# Patient Record
Sex: Male | Born: 1945 | Race: White | Hispanic: No | Marital: Married | Smoking: Former smoker
Health system: Southern US, Community
[De-identification: ages and names within clinical notes are randomized; demographics above are authoritative.]

## PROBLEM LIST (undated history)

## (undated) DIAGNOSIS — K760 Fatty (change of) liver, not elsewhere classified: Secondary | ICD-10-CM

## (undated) DIAGNOSIS — D696 Thrombocytopenia, unspecified: Secondary | ICD-10-CM

## (undated) DIAGNOSIS — K219 Gastro-esophageal reflux disease without esophagitis: Secondary | ICD-10-CM

## (undated) DIAGNOSIS — E041 Nontoxic single thyroid nodule: Secondary | ICD-10-CM

## (undated) DIAGNOSIS — E7800 Pure hypercholesterolemia, unspecified: Secondary | ICD-10-CM

## (undated) DIAGNOSIS — G4733 Obstructive sleep apnea (adult) (pediatric): Secondary | ICD-10-CM

## (undated) DIAGNOSIS — I10 Essential (primary) hypertension: Secondary | ICD-10-CM

## (undated) DIAGNOSIS — N400 Enlarged prostate without lower urinary tract symptoms: Secondary | ICD-10-CM

## (undated) DIAGNOSIS — R739 Hyperglycemia, unspecified: Secondary | ICD-10-CM

## (undated) DIAGNOSIS — D649 Anemia, unspecified: Secondary | ICD-10-CM

## (undated) HISTORY — DX: Hyperglycemia, unspecified: R73.9

## (undated) HISTORY — PX: INGUINAL HERNIA REPAIR: SUR1180

## (undated) HISTORY — DX: Thrombocytopenia, unspecified: D69.6

## (undated) HISTORY — DX: Pure hypercholesterolemia, unspecified: E78.00

## (undated) HISTORY — DX: Anemia, unspecified: D64.9

## (undated) HISTORY — DX: Gastro-esophageal reflux disease without esophagitis: K21.9

## (undated) HISTORY — DX: Essential (primary) hypertension: I10

## (undated) HISTORY — DX: Obstructive sleep apnea (adult) (pediatric): G47.33

## (undated) HISTORY — DX: Nontoxic single thyroid nodule: E04.1

## (undated) HISTORY — PX: TONSILLECTOMY: SUR1361

---

## 2005-03-12 ENCOUNTER — Ambulatory Visit: Payer: Self-pay | Admitting: Unknown Physician Specialty

## 2007-09-10 ENCOUNTER — Other Ambulatory Visit: Payer: Self-pay

## 2007-09-10 ENCOUNTER — Emergency Department: Payer: Self-pay | Admitting: Emergency Medicine

## 2010-06-24 ENCOUNTER — Emergency Department: Payer: Self-pay | Admitting: Emergency Medicine

## 2010-12-09 ENCOUNTER — Ambulatory Visit: Payer: Self-pay | Admitting: Internal Medicine

## 2010-12-23 ENCOUNTER — Ambulatory Visit: Payer: Self-pay | Admitting: Internal Medicine

## 2011-06-07 ENCOUNTER — Ambulatory Visit: Payer: Self-pay | Admitting: Internal Medicine

## 2012-01-28 ENCOUNTER — Other Ambulatory Visit: Payer: Self-pay | Admitting: *Deleted

## 2012-01-28 MED ORDER — ATORVASTATIN CALCIUM 40 MG PO TABS: 40.0000 mg | ORAL_TABLET | Freq: Every day | ORAL | Status: DC

## 2012-01-28 NOTE — Telephone Encounter (Signed)
R'cd fax from [facility] for refill of generic Lipitor 40mg  tablets-rx faxed to [facility] pharmacy.

## 2012-03-14 ENCOUNTER — Ambulatory Visit (INDEPENDENT_AMBULATORY_CARE_PROVIDER_SITE_OTHER): Payer: Self-pay | Admitting: Internal Medicine

## 2012-03-14 ENCOUNTER — Encounter: Payer: Self-pay | Admitting: Internal Medicine

## 2012-03-14 ENCOUNTER — Telehealth: Payer: Self-pay | Admitting: Internal Medicine

## 2012-03-14 VITALS — BP 150/84 | HR 57 | Temp 98.1°F | Ht 71.0 in | Wt 194.0 lb

## 2012-03-14 DIAGNOSIS — Z0189 Encounter for other specified special examinations: Secondary | ICD-10-CM

## 2012-03-14 DIAGNOSIS — Z23 Encounter for immunization: Secondary | ICD-10-CM

## 2012-03-14 DIAGNOSIS — Z008 Encounter for other general examination: Secondary | ICD-10-CM

## 2012-03-14 DIAGNOSIS — E041 Nontoxic single thyroid nodule: Secondary | ICD-10-CM

## 2012-03-14 DIAGNOSIS — R739 Hyperglycemia, unspecified: Secondary | ICD-10-CM

## 2012-03-14 DIAGNOSIS — R030 Elevated blood-pressure reading, without diagnosis of hypertension: Secondary | ICD-10-CM

## 2012-03-14 DIAGNOSIS — R7309 Other abnormal glucose: Secondary | ICD-10-CM

## 2012-03-14 DIAGNOSIS — E7800 Pure hypercholesterolemia, unspecified: Secondary | ICD-10-CM

## 2012-03-14 DIAGNOSIS — IMO0001 Reserved for inherently not codable concepts without codable children: Secondary | ICD-10-CM

## 2012-03-14 LAB — LIPID PANEL
Cholesterol: 168 mg/dL (ref 0–200)
HDL: 92.3 mg/dL (ref >39.00)
Triglycerides: 51 mg/dL (ref 0.0–149.0)
VLDL: 10.2 mg/dL (ref 0.0–40.0)

## 2012-03-14 LAB — BASIC METABOLIC PANEL
Calcium: 9.3 mg/dL (ref 8.4–10.5)
GFR: 81.92 mL/min (ref >60.00)
Glucose, Bld: 110 mg/dL — ABNORMAL HIGH (ref 70–99)
Potassium: 4.4 mEq/L (ref 3.5–5.1)
Sodium: 136 mEq/L (ref 135–145)

## 2012-03-14 LAB — HEPATIC FUNCTION PANEL
ALT: 34 U/L (ref 0–53)
AST: 34 U/L (ref 0–37)
Albumin: 4.4 g/dL (ref 3.5–5.2)
Total Bilirubin: 1 mg/dL (ref 0.3–1.2)
Total Protein: 7.4 g/dL (ref 6.0–8.3)

## 2012-03-14 LAB — HEMOGLOBIN A1C: Hgb A1c MFr Bld: 5.8 % (ref 4.6–6.5)

## 2012-03-14 MED ORDER — PNEUMOCOCCAL VAC POLYVALENT 25 MCG/0.5ML IJ INJ
0.5000 mL | INJECTION | Freq: Once | INTRAMUSCULAR | Status: AC
  Administered 2012-03-14: 0.5 mL via INTRAMUSCULAR

## 2012-03-14 NOTE — Progress Notes (Signed)
  Subjective:    Patient ID: Nathan Watts, male    DOB: [birth year], 66 y.o.   MRN: [id number]  HPI 66 year old male with past history of hypercholesterolemia, hyperglycemia and a thyroid nodule.  He comes in today for a scheduled follow up.  States he is doing well.  Exercising.  Now walking 6 miles per day.  No cardiac symptoms with increased activity or exertion.  Feels better.  Did get his flu shot.  Blood pressures on outside checks averaging:  117-129/64-72.   Past Medical History  Diagnosis Date  . Hypercholesterolemia   . Thrombocytopenia   . Hyperglycemia   . Thyroid nodule   . GERD (gastroesophageal reflux disease)   . Anemia, unspecified   . Hypertension     Current Outpatient Prescriptions on File Prior to Visit  Medication Sig Dispense Refill  . Omeprazole Magnesium (PRILOSEC OTC PO) Take by mouth daily.      Marland Kitchen atorvastatin (LIPITOR) 40 MG tablet Take 1 tablet (40 mg total) by mouth daily.  90 tablet  3    Review of Systems Patient denies any headache, lightheadedness or dizziness.  No sinus or allergy symptoms.  No chest pain, tightness or palpitations.  No increased shortness of breath, cough or congestion.  No nausea or vomiting.  No acid reflux.   No abdominal pain or cramping.  No bowel change, such as diarrhea, constipation, BRBPR or melana.  No urine change.        Objective:   Physical Exam Filed Vitals:   03/14/12 0831  BP: 150/84  Pulse: 57  Temp: 98.1 F (36.7 C)   Blood pressure recheck:  21/44  66 year old male in no acute distress.   HEENT:  Nares - clear.  OP- without lesions or erythema.  NECK:  Supple, nontender.  No audible bruit.   HEART:  Appears to be regular. LUNGS:  Without crackles or wheezing audible.  Respirations even and unlabored.   RADIAL PULSE:  Equal bilaterally.  ABDOMEN:  Soft, nontender.  No audible abdominal bruit.   EXTREMITIES:  No increased edema to be present.                     Assessment & Plan:  PULMONARY.   Asymptomatic.  Breathing stable.   THROMBOCYTOPENIA.  Intermittently, platelet count has been low.  Recheck cbc to confirm normal.   GI.  Had colonoscopy.  Recommended follow up colonoscopy 2016.  Hemoccult cards negative 2/41/3.  Currently asymptomatic.  ELEVATED BLOOD PRESSURE.  Outside checks under good control.  Follow.  Remain off medication.      HEALTH MAINTENANCE.  Last physical 05/07/11.  PSA checked 05/03/11 - 2.86.  Colonoscopy - above. Pneumovax given today.  Already had the flu shot.

## 2012-03-14 NOTE — Patient Instructions (Addendum)
It was nice seeing you today.  I am glad you have been doing well.  Let me know if you need anything. 

## 2012-03-14 NOTE — Assessment & Plan Note (Signed)
Biopsy negative.  Saw Dr Renae Fickle 06/26/11 - ultrasound revealed a stable nodule.  Recommended a follow up TSH and ultrasound in one year.  If stable at one year mark, recommended a follow up ultrasound in 3-5 years.

## 2012-03-18 NOTE — Telephone Encounter (Signed)
Opened in error

## 2012-03-19 ENCOUNTER — Encounter: Payer: Self-pay | Admitting: Internal Medicine

## 2012-03-19 NOTE — Assessment & Plan Note (Signed)
Low carb diet.  Continue exercise.  Check met b and a1c.

## 2012-03-19 NOTE — Assessment & Plan Note (Signed)
Remains on Lipitor.  Continue diet adjustment and exercise.  Follow lipid panel and liver function.   

## 2012-06-12 ENCOUNTER — Ambulatory Visit: Payer: Self-pay | Admitting: Internal Medicine

## 2012-06-22 ENCOUNTER — Encounter: Payer: Medicare Other | Admitting: Internal Medicine

## 2012-06-22 ENCOUNTER — Encounter: Payer: Self-pay | Admitting: Internal Medicine

## 2012-06-22 ENCOUNTER — Ambulatory Visit (INDEPENDENT_AMBULATORY_CARE_PROVIDER_SITE_OTHER): Payer: Medicare Other | Admitting: Internal Medicine

## 2012-06-22 VITALS — BP 140/78 | HR 52 | Temp 98.1°F | Ht 71.0 in | Wt 197.5 lb

## 2012-06-22 DIAGNOSIS — R7309 Other abnormal glucose: Secondary | ICD-10-CM

## 2012-06-22 DIAGNOSIS — R739 Hyperglycemia, unspecified: Secondary | ICD-10-CM

## 2012-06-22 DIAGNOSIS — E041 Nontoxic single thyroid nodule: Secondary | ICD-10-CM

## 2012-06-22 DIAGNOSIS — E7800 Pure hypercholesterolemia, unspecified: Secondary | ICD-10-CM

## 2012-06-22 DIAGNOSIS — Z125 Encounter for screening for malignant neoplasm of prostate: Secondary | ICD-10-CM

## 2012-06-22 LAB — BASIC METABOLIC PANEL
BUN: 16 mg/dL (ref 6–23)
Chloride: 97 mEq/L (ref 96–112)
GFR: 79.03 mL/min (ref >60.00)
Glucose, Bld: 88 mg/dL (ref 70–99)
Potassium: 4.1 mEq/L (ref 3.5–5.1)

## 2012-06-22 LAB — HEPATIC FUNCTION PANEL
ALT: 25 U/L (ref 0–53)
AST: 26 U/L (ref 0–37)
Albumin: 3.9 g/dL (ref 3.5–5.2)

## 2012-06-22 LAB — PSA, MEDICARE: PSA: 3.51 ng/ml (ref 0.10–4.00)

## 2012-06-22 LAB — LIPID PANEL
Cholesterol: 154 mg/dL (ref 0–200)
VLDL: 7.8 mg/dL (ref 0.0–40.0)

## 2012-06-22 LAB — CBC WITH DIFFERENTIAL/PLATELET
Basophils Absolute: 0 10*3/uL (ref 0.0–0.1)
HCT: 38.8 % — ABNORMAL LOW (ref 39.0–52.0)
Lymphs Abs: 1.5 10*3/uL (ref 0.7–4.0)
Monocytes Relative: 10.1 % (ref 3.0–12.0)
Neutrophils Relative %: 63.4 % (ref 43.0–77.0)
Platelets: 133 10*3/uL — ABNORMAL LOW (ref 150.0–400.0)
RDW: 14.4 % (ref 11.5–14.6)

## 2012-06-27 ENCOUNTER — Encounter: Payer: Self-pay | Admitting: Internal Medicine

## 2012-06-28 ENCOUNTER — Telehealth: Payer: Self-pay | Admitting: Internal Medicine

## 2012-06-28 DIAGNOSIS — D696 Thrombocytopenia, unspecified: Secondary | ICD-10-CM

## 2012-06-28 NOTE — Telephone Encounter (Signed)
Spoke with spouse she will have mr Kallstrom call and make lab appointment

## 2012-06-28 NOTE — Telephone Encounter (Signed)
Pt notified of lab results and need for a follow up non fasting lab within the next 2 weeks.  Please schedule a lab appt time and call pt.  Thanks.

## 2012-06-29 NOTE — Telephone Encounter (Signed)
NON FASTING LABS 4/1 PT AWARE OF APPOINTMENT

## 2012-07-03 ENCOUNTER — Encounter: Payer: Self-pay | Admitting: Internal Medicine

## 2012-07-03 NOTE — Progress Notes (Signed)
Subjective:    Patient ID: Nathan Watts, male    DOB: 14-Apr-1945, 67 y.o.   MRN: [id number]  HPI 67 year old male with past history of hypercholesterolemia, hyperglycemia and a thyroid nodule.  He comes in today to follow up on these issues as well as for a complete physical exam.  States he is doing well.  Exercising.  Now walking 6 miles per day.  No cardiac symptoms with increased activity or exertion.  Feels better.  Blood pressures on outside checks averaging:  120-128/60-70.  States started with cough yesterday.  Some frontal sinus pressure.  Some increased congestion.  Some chest tightness with the congestion.  No acid reflux.  No nausea or vomiting.  No bowel change.      Past Medical History  Diagnosis Date  . Hypercholesterolemia   . Thrombocytopenia   . Hyperglycemia   . Thyroid nodule   . GERD (gastroesophageal reflux disease)   . Anemia, unspecified   . Hypertension     Current Outpatient Prescriptions on File Prior to Visit  Medication Sig Dispense Refill  . atorvastatin (LIPITOR) 40 MG tablet Take 1 tablet (40 mg total) by mouth daily.  90 tablet  3  . cyanocobalamin 500 MCG tablet Take 500 mcg by mouth daily.      . Multiple Vitamin (MULTI VITAMIN MENS PO) Take 1 tablet by mouth daily.      . Omega-3 Fatty Acids (FISH OIL) 1200 MG CAPS Take 1 capsule by mouth daily.      . Omeprazole Magnesium (PRILOSEC OTC PO) Take by mouth daily.       No current facility-administered medications on file prior to visit.    Review of Systems Patient denies any headache, lightheadedness or dizziness. Sinus congestion and cough as outlined. No chest pain, tightness or palpitations.  No increased shortness of breath.  No nausea or vomiting.  No acid reflux.   No abdominal pain or cramping.  No bowel change, such as diarrhea, constipation, BRBPR or melana.  No urine change.   Just saw Dr Renae Fickle.  Recommended follow up regarding his thyroid nodule - in one year.       Objective:   Physical  Exam  Filed Vitals:   06/22/12 1324  BP: 140/78  Pulse: 52  Temp: 98.1 F (36.7 C)   Blood pressure recheck:  60/51  67 year old male in no acute distress.  HEENT:  Nares - clear.  Oropharynx - without lesions. NECK:  Supple.  Nontender.  No audible carotid bruit.  HEART:  Appears to be regular.   LUNGS:  No crackles or wheezing audible.  Respirations even and unlabored.   RADIAL PULSE:  Equal bilaterally.  ABDOMEN:  Soft.  Nontender.  Bowel sounds present and normal.  No audible abdominal bruit.  GU:  Normal descended testicles.  No palpable testicular nodules.   RECTAL:  Could not appreciate any palpable prostate nodules.  Heme negative.   EXTREMITIES:  No increased edema present.  DP pulses palpable and equal bilaterally.            Assessment & Plan:  PULMONARY.  The increased sinus pressure and cough as outlined.  Saline nasal spray and Flonase as directed.  Mucinex/robitussin as directed.  (abx given if needed).    THROMBOCYTOPENIA.  Intermittently, platelet count has been low.  Recheck cbc to confirm normal.   GI.  Had colonoscopy.  Recommended follow up colonoscopy 2016.  Hemoccult cards negative 05/17/11.  Currently asymptomatic.  ELEVATED BLOOD PRESSURE.  Outside checks under good control.  Follow.  Remain off medication.      HEALTH MAINTENANCE.  Physical today.   PSA checked 05/03/11 - 2.86.  Recheck with next labs.  Colonoscopy - above.

## 2012-07-03 NOTE — Assessment & Plan Note (Signed)
Low carb diet.  Continue exercise.  Check met b and a1c.

## 2012-07-03 NOTE — Assessment & Plan Note (Signed)
Remains on Lipitor.  Continue diet adjustment and exercise.  Follow lipid panel and liver function.   

## 2012-07-03 NOTE — Assessment & Plan Note (Signed)
Biopsy negative.  Saw Dr Renae Fickle today.  Recommended follow up in one year.  Refer to thyroid ultrasound and Dr Ollen Gross report for details.

## 2012-07-11 ENCOUNTER — Other Ambulatory Visit (INDEPENDENT_AMBULATORY_CARE_PROVIDER_SITE_OTHER): Payer: Medicare Other

## 2012-07-11 DIAGNOSIS — D696 Thrombocytopenia, unspecified: Secondary | ICD-10-CM

## 2012-07-11 DIAGNOSIS — R17 Unspecified jaundice: Secondary | ICD-10-CM

## 2012-07-11 LAB — HEPATIC FUNCTION PANEL
ALT: 32 U/L (ref 0–53)
AST: 34 U/L (ref 0–37)
Albumin: 4.2 g/dL (ref 3.5–5.2)
Alkaline Phosphatase: 47 U/L (ref 39–117)
Total Protein: 7.4 g/dL (ref 6.0–8.3)

## 2012-07-11 LAB — CBC WITH DIFFERENTIAL/PLATELET
Basophils Relative: 0.5 % (ref 0.0–3.0)
HCT: 41.2 % (ref 39.0–52.0)
Hemoglobin: 13.8 g/dL (ref 13.0–17.0)
Lymphocytes Relative: 27 % (ref 12.0–46.0)
MCHC: 33.4 g/dL (ref 30.0–36.0)
Monocytes Relative: 11 % (ref 3.0–12.0)
Neutro Abs: 4 10*3/uL (ref 1.4–7.7)
RBC: 4.49 Mil/uL (ref 4.22–5.81)

## 2012-07-12 ENCOUNTER — Telehealth: Payer: Self-pay | Admitting: Internal Medicine

## 2012-07-12 DIAGNOSIS — D696 Thrombocytopenia, unspecified: Secondary | ICD-10-CM

## 2012-07-12 NOTE — Telephone Encounter (Signed)
Pt notified of lab results and the need for repeat lab in 4 weeks.  Please schedule a non fasting lab appt in 4 weeks and call pt with appt date and time.  Thanks.

## 2012-07-12 NOTE — Telephone Encounter (Signed)
Spoke with spouse   Asked her to have ms Jocelyn to call office.  Please schedule lab appointment

## 2012-07-19 NOTE — Telephone Encounter (Signed)
Pt appointment 4/24 vanessa made pt aware

## 2012-07-29 ENCOUNTER — Other Ambulatory Visit: Payer: Self-pay | Admitting: Internal Medicine

## 2012-07-29 DIAGNOSIS — M79672 Pain in left foot: Secondary | ICD-10-CM

## 2012-07-29 NOTE — Progress Notes (Signed)
Order for podiatry referral placed.

## 2012-08-02 ENCOUNTER — Other Ambulatory Visit (INDEPENDENT_AMBULATORY_CARE_PROVIDER_SITE_OTHER): Payer: Medicare Other

## 2012-08-02 ENCOUNTER — Encounter: Payer: Self-pay | Admitting: Internal Medicine

## 2012-08-02 DIAGNOSIS — D696 Thrombocytopenia, unspecified: Secondary | ICD-10-CM

## 2012-08-02 LAB — CBC WITH DIFFERENTIAL/PLATELET
Basophils Absolute: 0 10*3/uL (ref 0.0–0.1)
Lymphocytes Relative: 28.6 % (ref 12.0–46.0)
Lymphs Abs: 2 10*3/uL (ref 0.7–4.0)
Monocytes Relative: 10.1 % (ref 3.0–12.0)
Neutrophils Relative %: 54.8 % (ref 43.0–77.0)
Platelets: 155 10*3/uL (ref 150.0–400.0)
RDW: 14 % (ref 11.5–14.6)

## 2012-10-17 ENCOUNTER — Encounter: Payer: Self-pay | Admitting: Internal Medicine

## 2012-10-20 ENCOUNTER — Ambulatory Visit (INDEPENDENT_AMBULATORY_CARE_PROVIDER_SITE_OTHER): Payer: Medicare Other | Admitting: Internal Medicine

## 2012-10-20 ENCOUNTER — Telehealth: Payer: Self-pay | Admitting: *Deleted

## 2012-10-20 ENCOUNTER — Encounter: Payer: Self-pay | Admitting: Internal Medicine

## 2012-10-20 VITALS — BP 140/80 | HR 60 | Temp 98.4°F | Ht 71.0 in | Wt 191.5 lb

## 2012-10-20 DIAGNOSIS — N390 Urinary tract infection, site not specified: Secondary | ICD-10-CM

## 2012-10-20 LAB — POCT URINALYSIS DIPSTICK
Bilirubin, UA: NEGATIVE
Glucose, UA: NEGATIVE
Leukocytes, UA: NEGATIVE
Nitrite, UA: NEGATIVE
pH, UA: 5.5

## 2012-10-20 MED ORDER — CYCLOBENZAPRINE HCL 5 MG PO TABS: 5.0000 mg | ORAL_TABLET | Freq: Every evening | ORAL | Status: DC | PRN

## 2012-10-20 NOTE — Telephone Encounter (Signed)
Received PA request form, placed in Dr. Roby Lofts folder

## 2012-10-20 NOTE — Telephone Encounter (Signed)
Called [phone] for prior authorization on the Cyclobenzaprine 5 mg tablet, form is being faxed over

## 2012-10-22 ENCOUNTER — Encounter: Payer: Self-pay | Admitting: Internal Medicine

## 2012-10-22 NOTE — Progress Notes (Signed)
Subjective:    Patient ID: Nathan Watts, male    DOB: Nov 27, 1945, 67 y.o.   MRN: [id number]  Back Pain  67 year old male with past history of hypercholesterolemia, hyperglycemia and a thyroid nodule.  He comes in today as a work in with concerns regarding left lower back pain.  States he started having pain in his left lower side/low back.  Started two weeks ago.  Bengay not helping.  States that 6-8 weeks ago he fell.  Hit his right side.  Has pain in his right ribs. Pain is better now.  Still has some discomfort, but overall much improved.  He is not aware of injuring his back with this fall.  No acid reflux.  No nausea or vomiting.  No bowel change.  No urine change.  No dysuria or hematuria.  No pain with ambulation.  Still doing his walking.  Some pain with sitting and sleeping.     Past Medical History  Diagnosis Date  . Hypercholesterolemia   . Thrombocytopenia   . Hyperglycemia   . Thyroid nodule   . GERD (gastroesophageal reflux disease)   . Anemia, unspecified   . Hypertension     Current Outpatient Prescriptions on File Prior to Visit  Medication Sig Dispense Refill  . aspirin 81 MG tablet Take 81 mg by mouth daily.      Marland Kitchen atorvastatin (LIPITOR) 40 MG tablet Take 1 tablet (40 mg total) by mouth daily.  90 tablet  3  . cyanocobalamin 500 MCG tablet Take 500 mcg by mouth daily.      . Multiple Vitamin (MULTI VITAMIN MENS PO) Take 1 tablet by mouth daily.      . Omega-3 Fatty Acids (FISH OIL) 1200 MG CAPS Take 1 capsule by mouth daily.      . Omeprazole Magnesium (PRILOSEC OTC PO) Take by mouth daily.       No current facility-administered medications on file prior to visit.    Review of Systems  Musculoskeletal: Positive for back pain.  Patient denies any headache, lightheadedness or dizziness.  No chest pain, tightness or palpitations.  No increased shortness of breath.  No nausea or vomiting.  No acid reflux.   No abdominal pain or cramping.  No bowel change, such as  diarrhea, constipation, BRBPR or melana.  No urine change.   Left lower back pain as outlined.  No pain radiating down his leg.  No numbness or tingling.      Objective:   Physical Exam  Filed Vitals:   10/20/12 1040  BP: 140/80  Pulse: 60  Temp: 98.4 F (43.14 C)   67 year old male in no acute distress.  NECK:  Supple.  Nontender.  No audible carotid bruit.  HEART:  Appears to be regular.   LUNGS:  No crackles or wheezing audible.  Respirations even and unlabored.  Good breath sounds bilaterally.  No significant pain with deep breathing.   RADIAL PULSE:  Equal bilaterally.  ABDOMEN:  Soft.  Nontender.  Bowel sounds present and normal.  No audible abdominal bruit.  BACK:  Increased tenderness to palpation over the lower left posterior ribs.  No bruising.  MSK:  No pain with straight leg raise.  Increased pain with rotating at his waist.              Assessment & Plan:  BACK PAIN.  Symptoms and exam as outlined.  Reproducible on exam.  Urinalysis negative.  Treat with tylenol arthritis as directed.  Flexeril 5mg  q hs.  Call with update over the next week.  Hold on xray.  Follow closely.    RIB PAIN.  S/p fall.  Rib pain improved.  Follow.   PULMONARY.  No sob.  No significant pain with deep breathing.    GI.  Had colonoscopy.  Recommended follow up colonoscopy 2016.  Currently asymptomatic.  ELEVATED BLOOD PRESSURE.  Outside checks under good control.  Reviewed his list of readings.  Blood pressure averaging 122-132/60-70s.       HEALTH MAINTENANCE.  Physical last visit.   PSA checked 06/22/12 - 3.51.   Colonoscopy - above.

## 2012-10-26 ENCOUNTER — Other Ambulatory Visit: Payer: Self-pay | Admitting: *Deleted

## 2012-10-26 MED ORDER — BACLOFEN 10 MG PO TABS: ORAL_TABLET | ORAL | Status: DC

## 2012-10-26 NOTE — Telephone Encounter (Signed)
Pt informed that his insurance would not cover the Flexeril, sent in Baclofen 10mg  #10 instead.

## 2012-10-30 ENCOUNTER — Encounter: Payer: Self-pay | Admitting: Internal Medicine

## 2012-12-28 ENCOUNTER — Encounter: Payer: Self-pay | Admitting: Internal Medicine

## 2012-12-28 ENCOUNTER — Ambulatory Visit (INDEPENDENT_AMBULATORY_CARE_PROVIDER_SITE_OTHER): Payer: Medicare Other | Admitting: Internal Medicine

## 2012-12-28 VITALS — BP 136/70 | HR 58 | Temp 98.5°F | Ht 71.0 in | Wt 187.0 lb

## 2012-12-28 DIAGNOSIS — E041 Nontoxic single thyroid nodule: Secondary | ICD-10-CM

## 2012-12-28 DIAGNOSIS — M722 Plantar fascial fibromatosis: Secondary | ICD-10-CM

## 2012-12-28 DIAGNOSIS — R7309 Other abnormal glucose: Secondary | ICD-10-CM

## 2012-12-28 DIAGNOSIS — Z23 Encounter for immunization: Secondary | ICD-10-CM

## 2012-12-28 DIAGNOSIS — R739 Hyperglycemia, unspecified: Secondary | ICD-10-CM

## 2012-12-28 DIAGNOSIS — E7800 Pure hypercholesterolemia, unspecified: Secondary | ICD-10-CM

## 2012-12-28 LAB — LIPID PANEL
HDL: 101.1 mg/dL (ref >39.00)
LDL Cholesterol: 74 mg/dL (ref 0–99)
Total CHOL/HDL Ratio: 2
Triglycerides: 43 mg/dL (ref 0.0–149.0)

## 2012-12-28 LAB — BASIC METABOLIC PANEL
Calcium: 9.5 mg/dL (ref 8.4–10.5)
Creatinine, Ser: 1 mg/dL (ref 0.4–1.5)

## 2012-12-28 LAB — HEPATIC FUNCTION PANEL
ALT: 31 U/L (ref 0–53)
Bilirubin, Direct: 0.2 mg/dL (ref 0.0–0.3)
Total Bilirubin: 1.1 mg/dL (ref 0.3–1.2)

## 2012-12-28 MED ORDER — ATORVASTATIN CALCIUM 40 MG PO TABS: 40.0000 mg | ORAL_TABLET | Freq: Every day | ORAL | Status: DC

## 2012-12-31 ENCOUNTER — Encounter: Payer: Self-pay | Admitting: Internal Medicine

## 2012-12-31 DIAGNOSIS — M722 Plantar fascial fibromatosis: Secondary | ICD-10-CM | POA: Insufficient documentation

## 2012-12-31 NOTE — Progress Notes (Signed)
  Subjective:    Patient ID: Nathan Watts, male    DO: 1945/10/13, 67 y.o.   MRM: [id number]  HPI 67 year old male with past history of hypercholesterolemia, hyperglycemia and a thyroid nodule.  He comes in today for a scheduled follow up.  States he is doing well.  Exercising.  Not able to walk secondary to his plantar fasciitis.  Doing the elliptical machine now and doing well with this.  No cardiac symptoms with increased activity or exertion.  Feels better.  Blood pressures on outside checks averaging:  120-128/60-70s.   No acid reflux.  No nausea or vomiting.  No bowel change.      Past Medical History  Diagnosis Date  . Hypercholesterolemia   . Thrombocytopenia   . Hyperglycemia   . Thyroid nodule   . GERD (gastroesophageal reflux disease)   . Anemia, unspecified   . Hypertension     Current Outpatient Prescriptions on File Prior to Visit  Medication Sig Dispense Refill  . aspirin 81 MG tablet Take 81 mg by mouth daily.      . baclofen (LIORESAL) 10 MG tablet Take 1/2-1 tablet at bedtime as needed for muscle spasms  10 each  0  . cyanocobalamin 500 MCG tablet Take 500 mcg by mouth daily.      . Multiple Vitamin (MULTI VITAMIN MENS PO) Take 1 tablet by mouth daily.      . Omega-3 Fatty Acids (FISH OIL) 1200 MG CAPS Take 1 capsule by mouth daily.      . Omeprazole Magnesium (PRILOSEC OTC PO) Take by mouth daily.       No current facility-administered medications on file prior to visit.    Review of Systems Patient denies any headache, lightheadedness or dizziness. No sinus or allergy symptoms.  No chest pain, tightness or palpitations.  No increased shortness of breath.  No nausea or vomiting.  No acid reflux.   No abdominal pain or cramping.  No bowel change, such as diarrhea, constipation, BRBPR or melana.  No urine change.   Seeing Dr Renae Fickle.  Recommended follow up regarding his thyroid nodule - in one year.       Objective:   Physical Exam  Filed Vitals:   12/28/12 0901   BP: 136/70  Pulse: 58  Temp: 98.5 F (36.9 C)   Blood pressure recheck:  148/78, pulse 76  67 year old male in no acute distress.  HEENT:  Nares - clear.  Oropharynx - without lesions. NECK:  Supple.  Nontender.  No audible carotid bruit.  HEART:  Appears to be regular.   LUNGS:  No crackles or wheezing audible.  Respirations even and unlabored.   RADIAL PULSE:  Equal bilaterally.  ABDOMEN:  Soft.  Nontender.  Bowel sounds present and normal.  No audible abdominal bruit.  EXTREMITIES:  No increased edema present.  DP pulses palpable and equal bilaterally.            Assessment & Plan:  PULMONARY.  Breathing stable.  No sob.    THROMBOCYTOPENIA.  Intermittently, platelet count has been low.  Follow cbc to confirm normal.   GI.  Had colonoscopy.  Recommended follow up colonoscopy 2016.  Hemoccult cards negative 05/17/11.  Currently asymptomatic.  ELEVATED BLOOD PRESSURE.  Outside checks under good control.  Follow.  Remain off medication.      HEALTH MAINTENANCE.  Physical 06/22/12.    PSA checked 06/22/12 -  3.51.  Colonoscopy - above.

## 2012-12-31 NOTE — Assessment & Plan Note (Signed)
Low carb diet.  Continue exercise.  Follow met b and a1c.   

## 2012-12-31 NOTE — Assessment & Plan Note (Signed)
Able to manage now.  Doing stretches.  Has seen Dr Ether Griffins.  Exercising on the elliptical now.  Desires no further intervention at this time.

## 2012-12-31 NOTE — Assessment & Plan Note (Signed)
Biopsy negative.  Seeing Dr Renae Fickle today.  Recommended follow up in one year.  Refer to thyroid ultrasound and Dr Ollen Gross report for details.

## 2012-12-31 NOTE — Assessment & Plan Note (Signed)
Remains on Lipitor.  Continue diet adjustment and exercise.  Follow lipid panel and liver function.   

## 2013-02-16 ENCOUNTER — Encounter: Payer: Self-pay | Admitting: Internal Medicine

## 2013-02-16 ENCOUNTER — Ambulatory Visit (INDEPENDENT_AMBULATORY_CARE_PROVIDER_SITE_OTHER): Payer: Medicare Other | Admitting: Internal Medicine

## 2013-02-16 VITALS — BP 120/80 | HR 60 | Temp 98.3°F | Ht 71.0 in | Wt 189.5 lb

## 2013-02-16 DIAGNOSIS — E041 Nontoxic single thyroid nodule: Secondary | ICD-10-CM

## 2013-02-16 DIAGNOSIS — R221 Localized swelling, mass and lump, neck: Secondary | ICD-10-CM

## 2013-02-16 DIAGNOSIS — R220 Localized swelling, mass and lump, head: Secondary | ICD-10-CM

## 2013-02-16 MED ORDER — AMOXICILLIN 875 MG PO TABS: 875.0000 mg | ORAL_TABLET | Freq: Two times a day (BID) | ORAL | Status: DC

## 2013-02-16 MED ORDER — FLUTICASONE PROPIONATE 50 MCG/ACT NA SUSP: 2.0000 | Freq: Every day | NASAL | Status: DC

## 2013-02-16 NOTE — Progress Notes (Signed)
Pre-visit discussion using our clinic review tool. No additional management support is needed unless otherwise documented below in the visit note.  

## 2013-02-18 ENCOUNTER — Encounter: Payer: Self-pay | Admitting: Internal Medicine

## 2013-02-18 DIAGNOSIS — R221 Localized swelling, mass and lump, neck: Secondary | ICD-10-CM | POA: Insufficient documentation

## 2013-02-18 NOTE — Assessment & Plan Note (Signed)
Symptoms and exam as outlined.  Will treat with amoxicillin as directed.  Get him back in soon to reassess for resolution of the pain and fullness. Question of - reactive lymph node.  Follow.

## 2013-02-18 NOTE — Progress Notes (Signed)
Subjective:    Patient ID: Nathan Watts, male    DO: [birth year], 67 y.o.   MRM: [id number]  Neck Pain   67 year old male with past history of hypercholesterolemia, hyperglycemia and a thyroid nodule.  He comes in today as a work in with concerns regarding some neck soreness.  States he is doing well.  Exercising.  Doing the elliptical machine now and doing well with this.  No cardiac symptoms with increased activity or exertion.  Blood pressures on outside checks doing well.   No acid reflux.  No nausea or vomiting.  No bowel change.  He reports that one week ago he notices some fullness and some occasional discomfort that shoots through the left side of his neck.  Sharp pain.  Has now developed some nasal congestion.  Some aching.  No fever.  No sob.      Past Medical History  Diagnosis Date  . Hypercholesterolemia   . Thrombocytopenia   . Hyperglycemia   . Thyroid nodule   . GERD (gastroesophageal reflux disease)   . Anemia, unspecified   . Hypertension     Current Outpatient Prescriptions on File Prior to Visit  Medication Sig Dispense Refill  . aspirin 81 MG tablet Take 81 mg by mouth daily.      Marland Kitchen atorvastatin (LIPITOR) 40 MG tablet Take 1 tablet (40 mg total) by mouth daily.  90 tablet  3  . baclofen (LIORESAL) 10 MG tablet Take 1/2-1 tablet at bedtime as needed for muscle spasms  10 each  0  . cyanocobalamin 500 MCG tablet Take 500 mcg by mouth daily.      . Multiple Vitamin (MULTI VITAMIN MENS PO) Take 1 tablet by mouth daily.      . Omega-3 Fatty Acids (FISH OIL) 1200 MG CAPS Take 1 capsule by mouth daily.      . Omeprazole Magnesium (PRILOSEC OTC PO) Take by mouth daily.       No current facility-administered medications on file prior to visit.    Review of Systems  Musculoskeletal: Positive for neck pain.  Patient denies any headache, lightheadedness or dizziness. No sinus or allergy symptoms.  No chest pain, tightness or palpitations.  No increased shortness of breath.   No nausea or vomiting.  No acid reflux.   No abdominal pain or cramping. No bowel change, such as diarrhea, constipation, BRBPR or melana.  No urine change.   Seeing Dr Renae Fickle.  Recommended follow up regarding his thyroid nodule - in one year.  With the neck discomfort and fullness as outlined.  Some aching and increased nasal congestion.       Objective:   Physical Exam  Filed Vitals:   02/16/13 1523  BP: 120/80  Pulse: 60  Temp: 98.3 F (18.77 C)   67 year old male in no acute distress.  HEENT:  Nares - clear.  Oropharynx - without lesions. NECK:  Supple.  No significant tenderness.  Minimal fullness/questionable left anterior cervical adenopathy.  No audible carotid bruit.  HEART:  Appears to be regular.   LUNGS:  No crackles or wheezing audible.  Respirations even and unlabored.   RADIAL PULSE:  Equal bilaterally.  ABDOMEN:  Soft.  Nontender.  Bowel sounds present and normal.  No audible abdominal bruit.  EXTREMITIES:  No increased edema present.  DP pulses palpable and equal bilaterally.            Assessment & Plan:  PULMONARY.  Breathing stable.  No sob.  THROMBOCYTOPENIA.  Intermittently, platelet count has been low.  Follow cbc to confirm normal.   GI.  Had colonoscopy.  Recommended follow up colonoscopy 2016.  Hemoccult cards negative 05/17/11.  Currently asymptomatic.  ELEVATED BLOOD PRESSURE.  Outside checks under good control.  Follow.  Remain off medication.      HEALTH MAINTENANCE.  Physical 06/22/12.    PSA checked 06/22/12 -  3.51.  Colonoscopy - above.

## 2013-02-18 NOTE — Assessment & Plan Note (Signed)
Biopsy negative.  Seeing Dr Renae Fickle today.  Recommended follow up in one year.  Refer to thyroid ultrasound and Dr Ollen Gross report for details.

## 2013-02-19 ENCOUNTER — Other Ambulatory Visit: Payer: Self-pay | Admitting: *Deleted

## 2013-02-19 MED ORDER — ATORVASTATIN CALCIUM 40 MG PO TABS: 40.0000 mg | ORAL_TABLET | Freq: Every day | ORAL | Status: DC

## 2013-03-02 ENCOUNTER — Ambulatory Visit (INDEPENDENT_AMBULATORY_CARE_PROVIDER_SITE_OTHER): Payer: Medicare Other | Admitting: Internal Medicine

## 2013-03-02 ENCOUNTER — Encounter: Payer: Self-pay | Admitting: Internal Medicine

## 2013-03-02 VITALS — BP 110/78 | HR 59 | Temp 98.0°F | Ht 71.0 in | Wt 190.5 lb

## 2013-03-02 DIAGNOSIS — R221 Localized swelling, mass and lump, neck: Secondary | ICD-10-CM

## 2013-03-02 DIAGNOSIS — E041 Nontoxic single thyroid nodule: Secondary | ICD-10-CM

## 2013-03-02 DIAGNOSIS — R220 Localized swelling, mass and lump, head: Secondary | ICD-10-CM

## 2013-03-02 NOTE — Patient Instructions (Signed)
Zantac (ranitidine) one tablet before bed.

## 2013-03-02 NOTE — Progress Notes (Signed)
Pre-visit discussion using our clinic review tool. No additional management support is needed unless otherwise documented below in the visit note.  

## 2013-03-02 NOTE — Progress Notes (Signed)
  Subjective:    Patient ID: Nathan Watts, male    DO: 01/13/1946, 67 y.o.   MRM: [id number]  Neck Pain   67 year old male with past history of hypercholesterolemia, hyperglycemia and a thyroid nodule.  He comes in today for a scheduled follow up.  Last visit was having increased neck pain.  The pain is better.  Completed abx.  Still with some persistent fullness.  Sore throat.  Worse in the am.  Feels different with swallowing.  No nausea or vomiting.  No acid reflux.  No sob.        Past Medical History  Diagnosis Date  . Hypercholesterolemia   . Thrombocytopenia   . Hyperglycemia   . Thyroid nodule   . GERD (gastroesophageal reflux disease)   . Anemia, unspecified   . Hypertension     Current Outpatient Prescriptions on File Prior to Visit  Medication Sig Dispense Refill  . aspirin 81 MG tablet Take 81 mg by mouth daily.      Marland Kitchen atorvastatin (LIPITOR) 40 MG tablet Take 1 tablet (40 mg total) by mouth daily.  90 tablet  2  . baclofen (LIORESAL) 10 MG tablet Take 1/2-1 tablet at bedtime as needed for muscle spasms  10 each  0  . cyanocobalamin 500 MCG tablet Take 500 mcg by mouth daily.      . fluticasone (FLONASE) 50 MCG/ACT nasal spray Place 2 sprays into both nostrils daily.  16 g  1  . Multiple Vitamin (MULTI VITAMIN MENS PO) Take 1 tablet by mouth daily.      . Omega-3 Fatty Acids (FISH OIL) 1200 MG CAPS Take 1 capsule by mouth daily.      . Omeprazole Magnesium (PRILOSEC OTC PO) Take by mouth daily.       No current facility-administered medications on file prior to visit.    Review of Systems  Musculoskeletal: Positive for neck pain.  Patient denies any headache, lightheadedness or dizziness. No sinus or allergy symptoms.  No chest pain, tightness or palpitations.  No increased shortness of breath.  No nausea or vomiting.  No acid reflux.  Neck pain better.  Sore throat.  Some change with swallowing.   No abdominal pain or cramping. No bowel change.       Objective:   Physical Exam  Filed Vitals:   03/02/13 1034  BP: 110/78  Pulse: 59  Temp: 98 F (53.44 C)   67 year old male in no acute distress.  HEENT:  Nares - clear.  Oropharynx - without lesions. NECK:  Supple.  No significant tenderness.  Minimal fullness/questionable left anterior cervical adenopathy.  No audible carotid bruit.  Non tender.   HEART:  Appears to be regular.   LUNGS:  No crackles or wheezing audible.  Respirations even and unlabored.            Assessment & Plan:  PULMONARY.  Breathing stable.  No sob.    GI.  Had colonoscopy.  Recommended follow up colonoscopy 2016.  Hemoccult cards negative 05/17/11.  Currently asymptomatic.       HEALTH MAINTENANCE.  Physical 06/22/12.    PSA checked 06/22/12 -  3.51.  Colonoscopy - above.

## 2013-03-02 NOTE — Assessment & Plan Note (Addendum)
Completed abx.  Persistent left neck fullness.  Sore throat.  Will refer to ENT for evaluation.

## 2013-03-04 ENCOUNTER — Encounter: Payer: Self-pay | Admitting: Internal Medicine

## 2013-03-04 NOTE — Assessment & Plan Note (Signed)
Biopsy negative.  Seeing Dr Renae Fickle.   Recommended follow up in one year.  Refer to thyroid ultrasound and Dr Ollen Gross report for details.

## 2013-07-03 ENCOUNTER — Ambulatory Visit (INDEPENDENT_AMBULATORY_CARE_PROVIDER_SITE_OTHER): Payer: Medicare Other | Admitting: Internal Medicine

## 2013-07-03 ENCOUNTER — Encounter: Payer: Self-pay | Admitting: Internal Medicine

## 2013-07-03 ENCOUNTER — Other Ambulatory Visit: Payer: Self-pay | Admitting: Internal Medicine

## 2013-07-03 ENCOUNTER — Ambulatory Visit (INDEPENDENT_AMBULATORY_CARE_PROVIDER_SITE_OTHER)
Admission: RE | Admit: 2013-07-03 | Discharge: 2013-07-03 | Disposition: A | Discharge status: Home / Self Care | Payer: Medicare Other | Source: Ambulatory Visit | Attending: Internal Medicine | Admitting: Internal Medicine

## 2013-07-03 ENCOUNTER — Telehealth: Payer: Self-pay | Admitting: *Deleted

## 2013-07-03 VITALS — BP 120/70 | HR 61 | Temp 98.4°F | Ht 69.5 in | Wt 192.0 lb

## 2013-07-03 DIAGNOSIS — M542 Cervicalgia: Secondary | ICD-10-CM

## 2013-07-03 DIAGNOSIS — E041 Nontoxic single thyroid nodule: Secondary | ICD-10-CM

## 2013-07-03 DIAGNOSIS — M722 Plantar fascial fibromatosis: Secondary | ICD-10-CM

## 2013-07-03 DIAGNOSIS — Z125 Encounter for screening for malignant neoplasm of prostate: Secondary | ICD-10-CM

## 2013-07-03 DIAGNOSIS — R220 Localized swelling, mass and lump, head: Secondary | ICD-10-CM

## 2013-07-03 DIAGNOSIS — R739 Hyperglycemia, unspecified: Secondary | ICD-10-CM

## 2013-07-03 DIAGNOSIS — R221 Localized swelling, mass and lump, neck: Secondary | ICD-10-CM

## 2013-07-03 DIAGNOSIS — R7309 Other abnormal glucose: Secondary | ICD-10-CM

## 2013-07-03 DIAGNOSIS — E7800 Pure hypercholesterolemia, unspecified: Secondary | ICD-10-CM

## 2013-07-03 LAB — CBC WITH DIFFERENTIAL/PLATELET
BASOS ABS: 0 10*3/uL (ref 0.0–0.1)
Basophils Relative: 0.3 % (ref 0.0–3.0)
EOS ABS: 0.1 10*3/uL (ref 0.0–0.7)
Eosinophils Relative: 2 % (ref 0.0–5.0)
HEMATOCRIT: 41 % (ref 39.0–52.0)
Hemoglobin: 13.8 g/dL (ref 13.0–17.0)
Lymphocytes Relative: 26.3 % (ref 12.0–46.0)
Lymphs Abs: 1.7 10*3/uL (ref 0.7–4.0)
MCHC: 33.6 g/dL (ref 30.0–36.0)
MCV: 91.9 fl (ref 78.0–100.0)
MONO ABS: 0.6 10*3/uL (ref 0.1–1.0)
Monocytes Relative: 9.4 % (ref 3.0–12.0)
Neutro Abs: 4 10*3/uL (ref 1.4–7.7)
Neutrophils Relative %: 62 % (ref 43.0–77.0)
PLATELETS: 153 10*3/uL (ref 150.0–400.0)
RBC: 4.47 Mil/uL (ref 4.22–5.81)
RDW: 14.1 % (ref 11.5–14.6)
WBC: 6.5 10*3/uL (ref 4.5–10.5)

## 2013-07-03 LAB — HEPATIC FUNCTION PANEL
ALK PHOS: 47 U/L (ref 39–117)
ALT: 25 U/L (ref 0–53)
AST: 30 U/L (ref 0–37)
Albumin: 4.3 g/dL (ref 3.5–5.2)
Bilirubin, Direct: 0.2 mg/dL (ref 0.0–0.3)
TOTAL PROTEIN: 7.1 g/dL (ref 6.0–8.3)
Total Bilirubin: 1 mg/dL (ref 0.3–1.2)

## 2013-07-03 LAB — BASIC METABOLIC PANEL
BUN: 16 mg/dL (ref 6–23)
CO2: 30 mEq/L (ref 19–32)
CREATININE: 1 mg/dL (ref 0.4–1.5)
Calcium: 9.6 mg/dL (ref 8.4–10.5)
Chloride: 100 mEq/L (ref 96–112)
GFR: 82.59 mL/min (ref >60.00)
Glucose, Bld: 93 mg/dL (ref 70–99)
Potassium: 4.6 mEq/L (ref 3.5–5.1)
Sodium: 137 mEq/L (ref 135–145)

## 2013-07-03 LAB — LIPID PANEL
CHOLESTEROL: 169 mg/dL (ref 0–200)
HDL: 83.7 mg/dL (ref >39.00)
LDL Cholesterol: 73 mg/dL (ref 0–99)
Total CHOL/HDL Ratio: 2
Triglycerides: 61 mg/dL (ref 0.0–149.0)
VLDL: 12.2 mg/dL (ref 0.0–40.0)

## 2013-07-03 LAB — HEMOGLOBIN A1C: HEMOGLOBIN A1C: 5.7 % (ref 4.6–6.5)

## 2013-07-03 LAB — PSA, MEDICARE: PSA: 3.79 ng/ml (ref 0.10–4.00)

## 2013-07-03 MED ORDER — CYCLOBENZAPRINE HCL 5 MG PO TABS: 5.0000 mg | ORAL_TABLET | Freq: Every evening | ORAL | Status: DC | PRN

## 2013-07-03 NOTE — Assessment & Plan Note (Signed)
Persistent neck pain.  Previous fall months ago.  Check c-spine xray.  Exam as outlined.  Flexeril 5mg  as needed.  Follow.  Instructed on possible side effects.

## 2013-07-03 NOTE — Telephone Encounter (Signed)
Dr. Adella Hare wants to add a TSH to today's labs?    Dx:[phone]

## 2013-07-03 NOTE — Assessment & Plan Note (Signed)
Biopsy negative.  Seeing Dr Eddie Dibbles.   Recommended follow up yearly.  Refer to thyroid ultrasound and Dr Sammuel Hines report for details.  Due to f/u with Dr Eddie Dibbles today.  Had ultrasound last week.

## 2013-07-03 NOTE — Assessment & Plan Note (Signed)
Saw ENT.  Had CT.  Negative.

## 2013-07-03 NOTE — Telephone Encounter (Signed)
I am going to place an order for a tsh to be added to blood drawn today.  Let me know if a problem.   Thanks.   Dr Nicki Reaper

## 2013-07-03 NOTE — Progress Notes (Signed)
Subjective:    Patient ID: Nathan Watts, male    DO: 1945/07/16, 68 y.o.   MRM: [id number]  HPI 68 year old male with past history of hypercholesterolemia, hyperglycemia and a thyroid nodule.  He comes in today to follow up on these issues as well as for a complete physical exam.  States he is doing well.  Exercising.  Plantar fasciitis is better.  Able to walk on the treadmill every other day.   Doing the elliptical machine and doing well with this.  No cardiac symptoms with increased activity or exertion.  Blood pressures on outside checks averaging:  127-137/70s.   No acid reflux.  No nausea or vomiting.  No bowel change.  Is having neck pain and pain in his shoulder.  No pain radiating down his arms.  No numbness or tingling.  Some pulling sensation when he looks from right to left.      Past Medical History  Diagnosis Date  . Hypercholesterolemia   . Thrombocytopenia   . Hyperglycemia   . Thyroid nodule   . GERD (gastroesophageal reflux disease)   . Anemia, unspecified   . Hypertension     Current Outpatient Prescriptions on File Prior to Visit  Medication Sig Dispense Refill  . aspirin 81 MG tablet Take 81 mg by mouth daily.      Marland Kitchen atorvastatin (LIPITOR) 40 MG tablet Take 1 tablet (40 mg total) by mouth daily.  90 tablet  2  . cyanocobalamin 500 MCG tablet Take 500 mcg by mouth daily.      . fluticasone (FLONASE) 50 MCG/ACT nasal spray Place 2 sprays into both nostrils daily.  16 g  1  . Multiple Vitamin (MULTI VITAMIN MENS PO) Take 1 tablet by mouth daily.      . Omega-3 Fatty Acids (FISH OIL) 1200 MG CAPS Take 1 capsule by mouth daily.      . Omeprazole Magnesium (PRILOSEC OTC PO) Take by mouth daily.       No current facility-administered medications on file prior to visit.    Review of Systems Patient denies any headache, lightheadedness or dizziness.  Neck pain as outlined.  No sinus or allergy symptoms.  No chest pain, tightness or palpitations.  No increased shortness of  breath.  No nausea or vomiting.  No acid reflux.   No abdominal pain or cramping.  No bowel change, such as diarrhea, constipation, BRBPR or melana.  No urine change.   Seeing Dr Eddie Dibbles.  Recommended follow up regarding his thyroid nodule once a year.  Due to see her today.       Objective:   Physical Exam  Filed Vitals:   07/03/13 0826  BP: 120/70  Pulse: 61  Temp: 98.4 F (35.55 C)   68 year old male in no acute distress.  HEENT:  Nares - clear.  Oropharynx - without lesions. NECK:  Supple.  Nontender.  No audible carotid bruit.  HEART:  Appears to be regular.   LUNGS:  No crackles or wheezing audible.  Respirations even and unlabored.   RADIAL PULSE:  Equal bilaterally.  ABDOMEN:  Soft.  Nontender.  Bowel sounds present and normal.  No audible abdominal bruit.  GU:  Normal descended testicles.  No palpable testicular nodules.   RECTAL:  Could not appreciate any palpable prostate nodules.  Heme negative.   EXTREMITIES:  No increased edema present.  DP pulses palpable and equal bilaterally.     MSK:  Minimal tenderness to palpation over the  center neck.  Some increased pulling sensation (noted on the right) with looking from right to left.        Assessment & Plan:  PULMONARY.  Breathing stable.  No sob.    THROMBOCYTOPENIA.  Intermittently, platelet count has been low.  Follow cbc to confirm normal.   GI.  Had colonoscopy.  Recommended follow up colonoscopy 2016.   Currently asymptomatic.  ELEVATED BLOOD PRESSURE.  Outside checks under good control.  Follow.  Remain off medication.      HEALTH MAINTENANCE.  Physical today.    PSA checked 06/22/12 -  3.51.  Check f/u psa today. Colonoscopy - above.

## 2013-07-03 NOTE — Assessment & Plan Note (Signed)
Doing better.  Follow.   

## 2013-07-03 NOTE — Progress Notes (Signed)
Pre-visit discussion using our clinic review tool. No additional management support is needed unless otherwise documented below in the visit note.  

## 2013-07-03 NOTE — Progress Notes (Signed)
Order placed for tsh 

## 2013-07-03 NOTE — Assessment & Plan Note (Signed)
Low carb diet.  Continue exercise.  Follow met b and a1c.

## 2013-07-03 NOTE — Assessment & Plan Note (Signed)
Remains on Lipitor.  Continue diet adjustment and exercise.  Follow lipid panel and liver function.

## 2013-07-04 ENCOUNTER — Encounter: Payer: Self-pay | Admitting: Internal Medicine

## 2013-07-04 ENCOUNTER — Other Ambulatory Visit: Payer: Self-pay | Admitting: Internal Medicine

## 2013-07-04 DIAGNOSIS — M542 Cervicalgia: Secondary | ICD-10-CM

## 2013-07-04 DIAGNOSIS — M25519 Pain in unspecified shoulder: Secondary | ICD-10-CM

## 2013-07-04 NOTE — Progress Notes (Signed)
Order placed for physical therapy referral.

## 2013-07-05 ENCOUNTER — Encounter: Payer: Self-pay | Admitting: Internal Medicine

## 2013-10-22 ENCOUNTER — Ambulatory Visit (INDEPENDENT_AMBULATORY_CARE_PROVIDER_SITE_OTHER): Payer: Medicare Other | Admitting: Internal Medicine

## 2013-10-22 ENCOUNTER — Encounter: Payer: Self-pay | Admitting: Internal Medicine

## 2013-10-22 VITALS — BP 130/80 | HR 50 | Temp 98.0°F | Ht 69.5 in | Wt 191.5 lb

## 2013-10-22 DIAGNOSIS — R39198 Other difficulties with micturition: Secondary | ICD-10-CM

## 2013-10-22 DIAGNOSIS — M549 Dorsalgia, unspecified: Secondary | ICD-10-CM

## 2013-10-22 DIAGNOSIS — R3912 Poor urinary stream: Secondary | ICD-10-CM

## 2013-10-22 DIAGNOSIS — M5489 Other dorsalgia: Secondary | ICD-10-CM

## 2013-10-22 LAB — POCT URINALYSIS DIPSTICK
Bilirubin, UA: NEGATIVE
GLUCOSE UA: NEGATIVE
LEUKOCYTES UA: NEGATIVE
NITRITE UA: NEGATIVE
Protein, UA: NEGATIVE
UROBILINOGEN UA: 0.2
pH, UA: 5.5

## 2013-10-22 MED ORDER — CIPROFLOXACIN HCL 500 MG PO TABS: 500.0000 mg | ORAL_TABLET | Freq: Two times a day (BID) | ORAL | Status: DC

## 2013-10-22 NOTE — Progress Notes (Signed)
Pre visit review using our clinic review tool, if applicable. No additional management support is needed unless otherwise documented below in the visit note. 

## 2013-10-23 LAB — URINALYSIS, ROUTINE W REFLEX MICROSCOPIC
Bilirubin Urine: NEGATIVE
HGB URINE DIPSTICK: NEGATIVE
Ketones, ur: NEGATIVE
LEUKOCYTES UA: NEGATIVE
NITRITE: NEGATIVE
RBC / HPF: NONE SEEN (ref 0–?)
Specific Gravity, Urine: <=1.005 — AB (ref 1.000–1.030)
Total Protein, Urine: NEGATIVE
UROBILINOGEN UA: 0.2 (ref 0.0–1.0)
Urine Glucose: NEGATIVE
WBC UA: NONE SEEN (ref 0–?)
pH: 6.5 (ref 5.0–8.0)

## 2013-10-24 ENCOUNTER — Encounter: Payer: Self-pay | Admitting: Internal Medicine

## 2013-10-24 LAB — CULTURE, URINE COMPREHENSIVE
Colony Count: NO GROWTH
Organism ID, Bacteria: NO GROWTH

## 2013-10-25 ENCOUNTER — Encounter: Payer: Self-pay | Admitting: Internal Medicine

## 2013-10-27 ENCOUNTER — Encounter: Payer: Self-pay | Admitting: Internal Medicine

## 2013-10-27 DIAGNOSIS — M549 Dorsalgia, unspecified: Secondary | ICD-10-CM | POA: Insufficient documentation

## 2013-10-27 NOTE — Assessment & Plan Note (Signed)
Right flank pain.  Exam as outlined.  Unclear etiology.  Could be msk origin.  Better when up moving and worse in am.  Not reproducible on exam.  Some urinary symptoms.  Question if prostate origin.  Urinalysis negative.  Continue advil and tylenol as directed.  cipro as directed - to treat for possible prostate infection.  Follow closely.  Call with update.

## 2013-10-27 NOTE — Progress Notes (Signed)
  Subjective:    Patient ID: Nathan Watts, male    DO: January 25, 1946, 68 y.o.   MRM: [id number]  Back Pain  68 year old male with past history of hypercholesterolemia, hyperglycemia and a thyroid nodule.  He comes in today as a work in with concerns regarding right flank pain.  States pain started three weeks ago.  Describes a sharp pain - right flank.  Notices when wakes up.  When he goes for his walk - loosens up.  When he first stands - hurts more.  Previously took two advil before bed.  This helped.  Has not gone completely away these last few weeks.  No abdominal pain or cramping.  Bowels doing well.  Some minima dysuria.  If he holds his urine, has noticed some urinary hesitancy.  States otherwise he is doing well.       Past Medical History  Diagnosis Date  . Hypercholesterolemia   . Thrombocytopenia   . Hyperglycemia   . Thyroid nodule   . GERD (gastroesophageal reflux disease)   . Anemia, unspecified   . Hypertension     Current Outpatient Prescriptions on File Prior to Visit  Medication Sig Dispense Refill  . aspirin 81 MG tablet Take 81 mg by mouth daily.      Marland Kitchen atorvastatin (LIPITOR) 40 MG tablet Take 1 tablet (40 mg total) by mouth daily.  90 tablet  2  . cyanocobalamin 500 MCG tablet Take 500 mcg by mouth daily.      . Multiple Vitamin (MULTI VITAMIN MENS PO) Take 1 tablet by mouth daily.      . Omega-3 Fatty Acids (FISH OIL) 1200 MG CAPS Take 1 capsule by mouth daily.      . Omeprazole Magnesium (PRILOSEC OTC PO) Take by mouth daily.       No current facility-administered medications on file prior to visit.    Review of Systems  Musculoskeletal: Positive for back pain.  Patient denies any headache, lightheadedness or dizziness.  No chest pain, tightness or palpitations.  No increased shortness of breath.  No nausea or vomiting.  No acid reflux.   No abdominal pain or cramping.  No bowel change, such as diarrhea, constipation, BRBPR or melana.  Urinary symptoms as outlined.   Right flank pain as outlined.  Hurts worse in the am.  When up and moving, seems to be better.        Objective:   Physical Exam  Filed Vitals:   10/22/13 1524  BP: 130/80  Pulse: 50  Temp: 98 F (15.43 C)   68 year old male in no acute distress.  NECK:  Supple.  Nontender.  HEART:  Appears to be regular.   LUNGS:  No crackles or wheezing audible.  Respirations even and unlabored.  ABDOMEN:  Soft.  Nontender.  Bowel sounds present and normal.  No audible abdominal bruit.  BACK:  No tenderness to palpation.  No CVA tenderness.  No pain with straight leg raise.  Motor strength equal bilateral lower extremities.  No pain with resistance to flexion and extension at the hip and knees.   GU:  Enlarged prostate.  Increased discomfort on exam.   EXTREMITIES:  No increased edema present.  DP pulses palpable and equal bilaterally.        Assessment & Plan:  HEALTH MAINTENANCE.  Physical last visit.    PSA checked 07/03/13 -  3.79.

## 2013-11-02 ENCOUNTER — Encounter: Payer: Self-pay | Admitting: Internal Medicine

## 2013-11-06 ENCOUNTER — Ambulatory Visit: Payer: Medicare Other | Admitting: Internal Medicine

## 2013-12-04 ENCOUNTER — Other Ambulatory Visit: Payer: Self-pay | Admitting: *Deleted

## 2013-12-04 ENCOUNTER — Telehealth: Payer: Self-pay | Admitting: Internal Medicine

## 2013-12-04 MED ORDER — ATORVASTATIN CALCIUM 40 MG PO TABS: 40.0000 mg | ORAL_TABLET | Freq: Every day | ORAL | Status: DC

## 2013-12-04 NOTE — Telephone Encounter (Signed)
Refilled lipitor #[id number] with 3 refills.  Sent to [facility]

## 2013-12-14 ENCOUNTER — Encounter: Payer: Self-pay | Admitting: Family Medicine

## 2013-12-14 ENCOUNTER — Ambulatory Visit (INDEPENDENT_AMBULATORY_CARE_PROVIDER_SITE_OTHER): Payer: Medicare Other | Admitting: Family Medicine

## 2013-12-14 VITALS — BP 144/82 | HR 60 | Temp 98.2°F | Wt 188.5 lb

## 2013-12-14 DIAGNOSIS — L03116 Cellulitis of left lower limb: Secondary | ICD-10-CM | POA: Insufficient documentation

## 2013-12-14 DIAGNOSIS — L02419 Cutaneous abscess of limb, unspecified: Secondary | ICD-10-CM

## 2013-12-14 DIAGNOSIS — L03119 Cellulitis of unspecified part of limb: Secondary | ICD-10-CM

## 2013-12-14 MED ORDER — DOXYCYCLINE HYCLATE 100 MG PO TABS
100.0000 mg | ORAL_TABLET | Freq: Two times a day (BID) | ORAL | Status: DC
Start: 2013-12-14 — End: 2015-01-06

## 2013-12-14 NOTE — Assessment & Plan Note (Signed)
Mild, should resolve with doxy.  D/w pt.  No need for I&D.  F/u prn.  He agrees.

## 2013-12-14 NOTE — Progress Notes (Signed)
Pre visit review using our clinic review tool, if applicable. No additional management support is needed unless otherwise documented below in the visit note.  Tetanus shot < 10 years per patient, possibly < 5 years.  R knee abraded after a fall. It is healing.  L shin lac ~2 weeks ago.  Was weed eating and some brush hit his shin.  Was healing, then more red and sore in the meantime.  No FB in the leg to his knowledge. He cleaned it at the time.    Meds, vitals, and allergies reviewed.   ROS: See HPI.  Otherwise, noncontributory.  nad R knee with healing 2cm x 4cm scab, appears to be healing well.   L shin 3cm by 1.5 cm redness, central longitudinal scab noted. No fluctuant, no purulent discharge.

## 2013-12-14 NOTE — Patient Instructions (Signed)
Start the doxy today, be careful about sun exposure and notify your regular doc if not improving.  Take care.

## 2014-01-05 ENCOUNTER — Ambulatory Visit (INDEPENDENT_AMBULATORY_CARE_PROVIDER_SITE_OTHER): Payer: Medicare Other

## 2014-01-05 DIAGNOSIS — Z23 Encounter for immunization: Secondary | ICD-10-CM

## 2014-01-22 ENCOUNTER — Ambulatory Visit: Payer: Medicare Other

## 2014-01-23 ENCOUNTER — Ambulatory Visit (INDEPENDENT_AMBULATORY_CARE_PROVIDER_SITE_OTHER): Payer: Medicare Other | Admitting: *Deleted

## 2014-01-23 DIAGNOSIS — Z23 Encounter for immunization: Secondary | ICD-10-CM

## 2014-06-14 ENCOUNTER — Encounter: Payer: Self-pay | Admitting: Internal Medicine

## 2014-06-14 MED ORDER — BENZONATATE 100 MG PO CAPS: 100.0000 mg | ORAL_CAPSULE | Freq: Three times a day (TID) | ORAL | Status: DC | PRN

## 2014-06-14 NOTE — Telephone Encounter (Signed)
rx sent in for tessalon perles.

## 2014-07-02 ENCOUNTER — Other Ambulatory Visit: Payer: Self-pay | Admitting: Internal Medicine

## 2014-07-02 DIAGNOSIS — Z1211 Encounter for screening for malignant neoplasm of colon: Secondary | ICD-10-CM

## 2014-07-02 NOTE — Progress Notes (Signed)
Order placed for GI referral.   

## 2014-10-02 ENCOUNTER — Ambulatory Visit
Admission: RE | Admit: 2014-10-02 | Discharge: 2014-10-02 | Disposition: A | Discharge status: Home / Self Care | Payer: Medicare Other | Source: Ambulatory Visit | Attending: Unknown Physician Specialty | Admitting: Unknown Physician Specialty

## 2014-10-02 ENCOUNTER — Encounter
Admission: RE | Disposition: A | Discharge status: Home / Self Care | Payer: Self-pay | Source: Ambulatory Visit | Attending: Unknown Physician Specialty

## 2014-10-02 ENCOUNTER — Ambulatory Visit: Payer: Medicare Other | Admitting: Anesthesiology

## 2014-10-02 ENCOUNTER — Encounter: Payer: Self-pay | Admitting: *Deleted

## 2014-10-02 DIAGNOSIS — K219 Gastro-esophageal reflux disease without esophagitis: Secondary | ICD-10-CM | POA: Diagnosis not present

## 2014-10-02 DIAGNOSIS — Z79899 Other long term (current) drug therapy: Secondary | ICD-10-CM | POA: Insufficient documentation

## 2014-10-02 DIAGNOSIS — Z7982 Long term (current) use of aspirin: Secondary | ICD-10-CM | POA: Insufficient documentation

## 2014-10-02 DIAGNOSIS — K640 First degree hemorrhoids: Secondary | ICD-10-CM | POA: Insufficient documentation

## 2014-10-02 DIAGNOSIS — I10 Essential (primary) hypertension: Secondary | ICD-10-CM | POA: Diagnosis not present

## 2014-10-02 DIAGNOSIS — E780 Pure hypercholesterolemia: Secondary | ICD-10-CM | POA: Insufficient documentation

## 2014-10-02 DIAGNOSIS — Z1211 Encounter for screening for malignant neoplasm of colon: Secondary | ICD-10-CM | POA: Diagnosis present

## 2014-10-02 DIAGNOSIS — Z87891 Personal history of nicotine dependence: Secondary | ICD-10-CM | POA: Insufficient documentation

## 2014-10-02 HISTORY — PX: COLONOSCOPY WITH PROPOFOL: SHX5780

## 2014-10-02 LAB — HM COLONOSCOPY

## 2014-10-02 SURGERY — COLONOSCOPY WITH PROPOFOL
Anesthesia: General

## 2014-10-02 MED ORDER — PHENYLEPHRINE HCL 10 MG/ML IJ SOLN
INTRAMUSCULAR | Status: DC | PRN
  Administered 2014-10-02: 120 ug via INTRAVENOUS

## 2014-10-02 MED ORDER — FENTANYL CITRATE (PF) 100 MCG/2ML IJ SOLN
INTRAMUSCULAR | Status: DC | PRN
  Administered 2014-10-02: 50 ug via INTRAVENOUS

## 2014-10-02 MED ORDER — LACTATED RINGERS IV SOLN
INTRAVENOUS | Status: DC
  Administered 2014-10-02: 1000 mL via INTRAVENOUS

## 2014-10-02 MED ORDER — MIDAZOLAM HCL 2 MG/2ML IJ SOLN
INTRAMUSCULAR | Status: DC | PRN
Start: 2014-10-02 — End: 2014-10-02
  Administered 2014-10-02: 2 mg via INTRAVENOUS

## 2014-10-02 NOTE — Anesthesia Procedure Notes (Signed)
Performed by: Vaughan Sine Pre-anesthesia Checklist: Patient identified, Emergency Drugs available, Suction available, Patient being monitored and Timeout performed Patient Re-evaluated:Patient Re-evaluated prior to inductionOxygen Delivery Method: Nasal cannula Preoxygenation: Pre-oxygenation with 100% oxygen Intubation Type: IV induction Placement Confirmation: positive ETCO2

## 2014-10-02 NOTE — Op Note (Signed)
[facility] Gastroenterology Patient Name: Nathan Watts Procedure Date: 10/02/2014 9:25 AM MRN: [id number] Account #: [id number] Date of Birth: Jul 20, 1945 Admit Type: Outpatient Age: 69 Room: [facility] ENDO ROOM 4 Gender: Male Note Status: Finalized Procedure:         Colonoscopy Indications:       Screening for colorectal malignant neoplasm Providers:         Manya Silvas, MD Referring MD:      Einar Pheasant, MD (Referring MD) Medicines:         Propofol per Anesthesia Complications:     No immediate complications. Procedure:         Pre-Anesthesia Assessment:                    - After reviewing the risks and benefits, the patient was                     deemed in satisfactory condition to undergo the procedure.                    After obtaining informed consent, the colonoscope was                     passed under direct vision. Throughout the procedure, the                     patient's blood pressure, pulse, and oxygen saturations                     were monitored continuously. The Olympus PCF-160AL                     colonoscope (S#. [id number]) was introduced through the anus                     and advanced to the the cecum, identified by appendiceal                     orifice and ileocecal valve. The colonoscopy was performed                     without difficulty. The patient tolerated the procedure                     well. The quality of the bowel preparation was excellent. Findings:      Internal hemorrhoids were found during endoscopy. The hemorrhoids were       small and Grade I (internal hemorrhoids that do not prolapse).      The exam was otherwise without abnormality. Impression:        - Internal hemorrhoids.                    - The examination was otherwise normal.                    - No specimens collected. Recommendation:    - Repeat colonoscopy in 10 years for screening purposes. Manya Silvas, MD 10/02/2014 10:06:33 AM This report  has been signed electronically. Number of Addenda: 0 Note Initiated On: 10/02/2014 9:25 AM Scope Withdrawal Time: 0 hours 7 minutes 1 second  Total Procedure Duration: 0 hours 12 minutes 43 seconds       [facility]

## 2014-10-02 NOTE — Transfer of Care (Signed)
Immediate Anesthesia Transfer of Care Note  Patient: Nathan Watts  Procedure(s) Performed: Procedure(s): COLONOSCOPY WITH PROPOFOL (N/A)  Patient Location: PACU  Anesthesia Type:General  Level of Consciousness: awake and sedated  Airway & Oxygen Therapy: Patient Spontanous Breathing and Patient connected to nasal cannula oxygen  Post-op Assessment: Report given to RN and Post -op Vital signs reviewed and stable  Post vital signs: Reviewed and stable  Last Vitals:  Filed Vitals:   10/02/14 0913  BP: 138/78  Pulse: 55  Temp: 36.5 C  Resp: 16    Complications: No apparent anesthesia complications

## 2014-10-02 NOTE — Anesthesia Postprocedure Evaluation (Signed)
  Anesthesia Post-op Note  Patient: Nathan Watts  Procedure(s) Performed: Procedure(s): COLONOSCOPY WITH PROPOFOL (N/A)  Anesthesia type:General  Patient location: PACU  Post pain: Pain level controlled  Post assessment: Post-op Vital signs reviewed, Patient's Cardiovascular Status Stable, Respiratory Function Stable, Patent Airway and No signs of Nausea or vomiting  Post vital signs: Reviewed and stable  Last Vitals:  Filed Vitals:   10/02/14 0913  BP: 138/78  Pulse: 55  Temp: 36.5 C  Resp: 16    Level of consciousness: awake, alert  and patient cooperative  Complications: No apparent anesthesia complications

## 2014-10-02 NOTE — Anesthesia Preprocedure Evaluation (Signed)
Anesthesia Evaluation  Patient identified by MRN, date of birth, ID band Patient awake    Reviewed: Allergy & Precautions, NPO status , Patient's Chart, lab work & pertinent test results  History of Anesthesia Complications Negative for: history of anesthetic complications  Airway Mallampati: III       Dental  (+) Teeth Intact   Pulmonary former smoker,    Pulmonary exam normal       Cardiovascular hypertension, Normal cardiovascular exam    Neuro/Psych negative neurological ROS  negative psych ROS   GI/Hepatic Neg liver ROS, GERD-  ,  Endo/Other  negative endocrine ROS  Renal/GU negative Renal ROS  negative genitourinary   Musculoskeletal negative musculoskeletal ROS (+)   Abdominal Normal abdominal exam  (+)   Peds negative pediatric ROS (+)  Hematology  (+) anemia ,   Anesthesia Other Findings   Reproductive/Obstetrics negative OB ROS                             Anesthesia Physical Anesthesia Plan  ASA: II  Anesthesia Plan: General   Post-op Pain Management:    Induction: Intravenous  Airway Management Planned: Nasal Cannula  Additional Equipment:   Intra-op Plan:   Post-operative Plan:   Informed Consent: I have reviewed the patients History and Physical, chart, labs and discussed the procedure including the risks, benefits and alternatives for the proposed anesthesia with the patient or authorized representative who has indicated his/her understanding and acceptance.     Plan Discussed with: CRNA  Anesthesia Plan Comments:         Anesthesia Quick Evaluation

## 2014-10-02 NOTE — Anesthesia Postprocedure Evaluation (Signed)
  Anesthesia Post-op Note  Patient: Nathan Watts  Procedure(s) Performed: Procedure(s): COLONOSCOPY WITH PROPOFOL (N/A)  Anesthesia type:General  Patient location: PACU  Post pain: Pain level controlled  Post assessment: Post-op Vital signs reviewed, Patient's Cardiovascular Status Stable, Respiratory Function Stable, Patent Airway and No signs of Nausea or vomiting  Post vital signs: Reviewed and stable  Last Vitals:  Filed Vitals:   10/02/14 1020  BP: 91/79  Pulse: 59  Temp:   Resp: 18    Level of consciousness: awake, alert  and patient cooperative  Complications: No apparent anesthesia complications

## 2014-10-02 NOTE — H&P (Signed)
Primary Care Physician:  Einar Pheasant, MD Primary Gastroenterologist:  Dr. Vira Agar  Pre-Procedure History & Physical: HPI:  Nathan Watts is a 69 y.o. male is here for an colonoscopy.   Past Medical History  Diagnosis Date  . Hypercholesterolemia   . Thrombocytopenia   . Hyperglycemia   . Thyroid nodule   . GERD (gastroesophageal reflux disease)   . Anemia, unspecified   . Hypertension     Past Surgical History  Procedure Laterality Date  . Tonsillectomy    . Inguinal hernia repair      Prior to Admission medications   Medication Sig Start Date End Date Taking? Authorizing Provider  aspirin 81 MG tablet Take 81 mg by mouth daily.    Historical Provider, MD  atorvastatin (LIPITOR) 40 MG tablet Take 1 tablet (40 mg total) by mouth daily. 12/04/13   Einar Pheasant, MD  benzonatate (TESSALON) 100 MG capsule Take 1 capsule (100 mg total) by mouth 3 (three) times daily as needed for cough. Patient not taking: Reported on 10/02/2014 06/14/14   Einar Pheasant, MD  cyanocobalamin 500 MCG tablet Take 500 mcg by mouth daily.    Historical Provider, MD  doxycycline (VIBRA-TABS) 100 MG tablet Take 1 tablet (100 mg total) by mouth 2 (two) times daily. Patient not taking: Reported on 10/02/2014 12/14/13   Tonia Ghent, MD  Multiple Vitamin (MULTI VITAMIN MENS PO) Take 1 tablet by mouth daily.    Historical Provider, MD  Omega-3 Fatty Acids (FISH OIL) 1200 MG CAPS Take 1 capsule by mouth daily.    Historical Provider, MD  Omeprazole Magnesium (PRILOSEC OTC PO) Take by mouth daily.    Historical Provider, MD    Allergies as of 09/30/2014 - Review Complete 12/14/2013  Allergen Reaction Noted  . Crestor [rosuvastatin]  03/14/2012    Family History  Problem Relation Age of Onset  . Prostate cancer Father   . Heart disease Father     s/p CABG  . Stroke Mother   . Colon cancer Neg Hx     History   Social History  . Marital Status: Married    Spouse Name: N/A  . Number of Children:  N/A  . Years of Education: N/A   Occupational History  . Not on file.   Social History Main Topics  . Smoking status: Former Smoker    Quit date: 04/12/1998  . Smokeless tobacco: Never Used  . Alcohol Use: Yes     Comment: a few drinks a week  . Drug Use: No  . Sexual Activity: No   Other Topics Concern  . Not on file   Social History Narrative   He is married. Has four children    Review of Systems: See HPI, otherwise negative ROS  Physical Exam: BP 138/78 mmHg  Pulse 55  Temp(Src) 97.7 F (36.5 C) (Tympanic)  Resp 16  Ht 5\' 11"  (1.803 m)  Wt 83.915 kg (185 lb)  BMI 25.81 kg/m2  SpO2 100% General:   Alert,  pleasant and cooperative in NAD Head:  Normocephalic and atraumatic. Neck:  Supple; no masses or thyromegaly. Lungs:  Clear throughout to auscultation.    Heart:  Regular rate and rhythm. Abdomen:  Soft, nontender and nondistended. Normal bowel sounds, without guarding, and without rebound.   Neurologic:  Alert and  oriented x4;  grossly normal neurologically.  Impression/Plan: Nathan Watts is here for an colonoscopy to be performed for screening colonoscopy  Risks, benefits, limitations, and alternatives regarding  colonoscopy  have been reviewed with the patient.  Questions have been answered.  All parties agreeable.   Gaylyn Cheers, MD  10/02/2014, 9:33 AM   Primary Care Physician:  Einar Pheasant, MD Primary Gastroenterologist:  Dr. Vira Agar  Pre-Procedure History & Physical: HPI:  Nathan Watts is a 69 y.o. male is here for an colonoscopy.   Past Medical History  Diagnosis Date  . Hypercholesterolemia   . Thrombocytopenia   . Hyperglycemia   . Thyroid nodule   . GERD (gastroesophageal reflux disease)   . Anemia, unspecified   . Hypertension     Past Surgical History  Procedure Laterality Date  . Tonsillectomy    . Inguinal hernia repair      Prior to Admission medications   Medication Sig Start Date End Date Taking? Authorizing  Provider  aspirin 81 MG tablet Take 81 mg by mouth daily.    Historical Provider, MD  atorvastatin (LIPITOR) 40 MG tablet Take 1 tablet (40 mg total) by mouth daily. 12/04/13   Einar Pheasant, MD  benzonatate (TESSALON) 100 MG capsule Take 1 capsule (100 mg total) by mouth 3 (three) times daily as needed for cough. Patient not taking: Reported on 10/02/2014 06/14/14   Einar Pheasant, MD  cyanocobalamin 500 MCG tablet Take 500 mcg by mouth daily.    Historical Provider, MD  doxycycline (VIBRA-TABS) 100 MG tablet Take 1 tablet (100 mg total) by mouth 2 (two) times daily. Patient not taking: Reported on 10/02/2014 12/14/13   Tonia Ghent, MD  Multiple Vitamin (MULTI VITAMIN MENS PO) Take 1 tablet by mouth daily.    Historical Provider, MD  Omega-3 Fatty Acids (FISH OIL) 1200 MG CAPS Take 1 capsule by mouth daily.    Historical Provider, MD  Omeprazole Magnesium (PRILOSEC OTC PO) Take by mouth daily.    Historical Provider, MD    Allergies as of 09/30/2014 - Review Complete 12/14/2013  Allergen Reaction Noted  . Crestor [rosuvastatin]  03/14/2012    Family History  Problem Relation Age of Onset  . Prostate cancer Father   . Heart disease Father     s/p CABG  . Stroke Mother   . Colon cancer Neg Hx     History   Social History  . Marital Status: Married    Spouse Name: N/A  . Number of Children: N/A  . Years of Education: N/A   Occupational History  . Not on file.   Social History Main Topics  . Smoking status: Former Smoker    Quit date: 04/12/1998  . Smokeless tobacco: Never Used  . Alcohol Use: Yes     Comment: a few drinks a week  . Drug Use: No  . Sexual Activity: No   Other Topics Concern  . Not on file   Social History Narrative   He is married. Has four children    Review of Systems: See HPI, otherwise negative ROS  Physical Exam: BP 138/78 mmHg  Pulse 55  Temp(Src) 97.7 F (36.5 C) (Tympanic)  Resp 16  Ht 5\' 11"  (1.803 m)  Wt 83.915 kg (185 lb)  BMI  25.81 kg/m2  SpO2 100% General:   Alert,  pleasant and cooperative in NAD Head:  Normocephalic and atraumatic. Neck:  Supple; no masses or thyromegaly. Lungs:  Clear throughout to auscultation.    Heart:  Mild bradycardia, no murmurs or gallops Abdomen:  Soft, nontender and nondistended. Normal bowel sounds, without guarding, and without rebound.   Neurologic:  Alert and  oriented x4;  grossly normal neurologically.  Impression/Plan: Nathan Watts is here for an colonoscopy to be performed for screening colonoscopy  Risks, benefits, limitations, and alternatives regarding  colonoscopy have been reviewed with the patient.  Questions have been answered.  All parties agreeable.   Gaylyn Cheers, MD  10/02/2014, 9:33 AM

## 2014-10-09 ENCOUNTER — Encounter: Payer: Self-pay | Admitting: Internal Medicine

## 2014-10-09 DIAGNOSIS — Z0000 Encounter for general adult medical examination without abnormal findings: Secondary | ICD-10-CM | POA: Insufficient documentation

## 2014-12-12 ENCOUNTER — Other Ambulatory Visit: Payer: Self-pay | Admitting: Internal Medicine

## 2014-12-12 NOTE — Telephone Encounter (Signed)
Last OV 7.13.15, please advise refill

## 2014-12-13 NOTE — Telephone Encounter (Signed)
ok'd refill atorvastatin #90 with no refills.  Has appt 01/06/15.

## 2015-01-06 ENCOUNTER — Encounter: Payer: Self-pay | Admitting: Internal Medicine

## 2015-01-06 ENCOUNTER — Ambulatory Visit (INDEPENDENT_AMBULATORY_CARE_PROVIDER_SITE_OTHER): Payer: Medicare Other | Admitting: Internal Medicine

## 2015-01-06 VITALS — BP 122/80 | HR 57 | Temp 98.4°F | Resp 17 | Ht 69.0 in | Wt 197.0 lb

## 2015-01-06 DIAGNOSIS — E041 Nontoxic single thyroid nodule: Secondary | ICD-10-CM

## 2015-01-06 DIAGNOSIS — Z0000 Encounter for general adult medical examination without abnormal findings: Secondary | ICD-10-CM | POA: Diagnosis not present

## 2015-01-06 DIAGNOSIS — E7800 Pure hypercholesterolemia, unspecified: Secondary | ICD-10-CM

## 2015-01-06 DIAGNOSIS — R739 Hyperglycemia, unspecified: Secondary | ICD-10-CM

## 2015-01-06 DIAGNOSIS — R109 Unspecified abdominal pain: Secondary | ICD-10-CM

## 2015-01-06 DIAGNOSIS — Z23 Encounter for immunization: Secondary | ICD-10-CM

## 2015-01-06 DIAGNOSIS — Z125 Encounter for screening for malignant neoplasm of prostate: Secondary | ICD-10-CM

## 2015-01-06 LAB — LIPID PANEL
CHOL/HDL RATIO: 2
Cholesterol: 189 mg/dL (ref 0–200)
HDL: 88.7 mg/dL (ref >39.00)
LDL CALC: 88 mg/dL (ref 0–99)
NonHDL: 100.3
TRIGLYCERIDES: 63 mg/dL (ref 0.0–149.0)
VLDL: 12.6 mg/dL (ref 0.0–40.0)

## 2015-01-06 LAB — CBC WITH DIFFERENTIAL/PLATELET
BASOS ABS: 0 10*3/uL (ref 0.0–0.1)
Basophils Relative: 0.3 % (ref 0.0–3.0)
EOS PCT: 1.6 % (ref 0.0–5.0)
Eosinophils Absolute: 0.1 10*3/uL (ref 0.0–0.7)
HEMATOCRIT: 43.6 % (ref 39.0–52.0)
Hemoglobin: 14.2 g/dL (ref 13.0–17.0)
LYMPHS PCT: 25.2 % (ref 12.0–46.0)
Lymphs Abs: 1.8 10*3/uL (ref 0.7–4.0)
MCHC: 32.6 g/dL (ref 30.0–36.0)
MCV: 93.3 fl (ref 78.0–100.0)
MONOS PCT: 8.5 % (ref 3.0–12.0)
Monocytes Absolute: 0.6 10*3/uL (ref 0.1–1.0)
NEUTROS ABS: 4.7 10*3/uL (ref 1.4–7.7)
Neutrophils Relative %: 64.4 % (ref 43.0–77.0)
Platelets: 136 10*3/uL — ABNORMAL LOW (ref 150.0–400.0)
RBC: 4.67 Mil/uL (ref 4.22–5.81)
RDW: 14.4 % (ref 11.5–15.5)
WBC: 7.3 10*3/uL (ref 4.0–10.5)

## 2015-01-06 LAB — BASIC METABOLIC PANEL
BUN: 19 mg/dL (ref 6–23)
CO2: 29 mEq/L (ref 19–32)
Calcium: 9.8 mg/dL (ref 8.4–10.5)
Chloride: 102 mEq/L (ref 96–112)
Creatinine, Ser: 0.86 mg/dL (ref 0.40–1.50)
GFR: 93.35 mL/min (ref >60.00)
Glucose, Bld: 105 mg/dL — ABNORMAL HIGH (ref 70–99)
POTASSIUM: 4.4 meq/L (ref 3.5–5.1)
SODIUM: 141 meq/L (ref 135–145)

## 2015-01-06 LAB — PSA, MEDICARE: PSA: 4.44 ng/ml — ABNORMAL HIGH (ref 0.10–4.00)

## 2015-01-06 LAB — HEMOGLOBIN A1C: Hgb A1c MFr Bld: 5.6 % (ref 4.6–6.5)

## 2015-01-06 LAB — TSH: TSH: 1.6 u[IU]/mL (ref 0.35–4.50)

## 2015-01-06 LAB — HEPATIC FUNCTION PANEL
ALBUMIN: 4.5 g/dL (ref 3.5–5.2)
ALT: 36 U/L (ref 0–53)
AST: 33 U/L (ref 0–37)
Alkaline Phosphatase: 46 U/L (ref 39–117)
Bilirubin, Direct: 0.2 mg/dL (ref 0.0–0.3)
Total Bilirubin: 0.9 mg/dL (ref 0.2–1.2)
Total Protein: 7.3 g/dL (ref 6.0–8.3)

## 2015-01-06 MED ORDER — ATORVASTATIN CALCIUM 40 MG PO TABS: ORAL_TABLET | ORAL | Status: DC

## 2015-01-06 NOTE — Assessment & Plan Note (Signed)
Previous biopsy negative.  Seeing Dr Eddie Dibbles.  Recommended f/u yearly.  States will check on appt.

## 2015-01-06 NOTE — Assessment & Plan Note (Signed)
None on exam.  Only notices if over exerts.  No chest pain or tightness.  Will cut back some and see if improves.  Noticed more with walking 54miles per day.  Reports as just soreness.  Bowels doing well.  No inguinal pain now.  Noticed previously for several days when lifted excessive weight.  Follow.

## 2015-01-06 NOTE — Assessment & Plan Note (Signed)
Low carb diet and exercise.  Follow met b and a1c.  

## 2015-01-06 NOTE — Addendum Note (Signed)
Addended by: Karlene Einstein D on: 01/06/2015 11:16 AM   Modules accepted: Orders

## 2015-01-06 NOTE — Progress Notes (Signed)
Patient ID: Leoncio Hansen, male   DOB: 08/21/1945, 69 y.o.   MRN: [id number]   Subjective:    Patient ID: [name], male    DOB: 1945-12-30, 69 y.o.   MRN: [id number]  HPI  Patient with past history of thyroid nodule, elevated blood pressure, hypercholesterolemia.  He comes in today to follow up on these issues as well as for a complete physical exam.  He is exercising regularly.  Walks 7 miles per day on the days he does not go to the gym (averages 4-5 days per week).  Five miles per day with weights the other days of the week.  No cardiac symptoms with increased activity or exertion.  No sob.  No acid reflux reported.  No abdominal pain or cramping.  States occasionally will notice some abdominal soreness after walking, but resolves when stops. No bowel change.  No urine issues.  No persistent pain.  Overall feels good.  Increased stress with his wife's medical issues and his granddaughter's medical issues.  Feels he is handling things relatively well.  Blood pressure has been averating 120-130s/60-70s on outside checks.     Past Medical History  Diagnosis Date  . Hypercholesterolemia   . Thrombocytopenia   . Hyperglycemia   . Thyroid nodule   . GERD (gastroesophageal reflux disease)   . Anemia, unspecified   . Hypertension    Past Surgical History  Procedure Laterality Date  . Tonsillectomy    . Inguinal hernia repair    . Colonoscopy with propofol N/A 10/02/2014    Procedure: COLONOSCOPY WITH PROPOFOL;  Surgeon: Manya Silvas, MD;  Location: [facility] ENDOSCOPY;  Service: Endoscopy;  Laterality: N/A;   Family History  Problem Relation Age of Onset  . Prostate cancer Father   . Heart disease Father     s/p CABG  . Stroke Mother   . Colon cancer Neg Hx    Social History   Social History  . Marital Status: Married    Spouse Name: N/A  . Number of Children: N/A  . Years of Education: N/A   Social History Main Topics  . Smoking status: Former Smoker    Quit date: 04/12/1998    . Smokeless tobacco: Never Used  . Alcohol Use: 0.0 oz/week    0 Standard drinks or equivalent per week     Comment: a few drinks a week  . Drug Use: No  . Sexual Activity: No   Other Topics Concern  . None   Social History Narrative   He is married. Has four children    Outpatient Encounter Prescriptions as of 01/06/2015  Medication Sig  . aspirin 81 MG tablet Take 81 mg by mouth daily.  Marland Kitchen atorvastatin (LIPITOR) 40 MG tablet TAKE 1 BY MOUTH DAILY  . cyanocobalamin 500 MCG tablet Take 500 mcg by mouth daily.  . Multiple Vitamin (MULTI VITAMIN MENS PO) Take 1 tablet by mouth daily.  . Omega-3 Fatty Acids (FISH OIL) 1200 MG CAPS Take 1 capsule by mouth daily.  . Omeprazole Magnesium (PRILOSEC OTC PO) Take by mouth daily.  . [DISCONTINUED] atorvastatin (LIPITOR) 40 MG tablet TAKE 1 BY MOUTH DAILY  . [DISCONTINUED] benzonatate (TESSALON) 100 MG capsule Take 1 capsule (100 mg total) by mouth 3 (three) times daily as needed for cough. (Patient not taking: Reported on 10/02/2014)  . [DISCONTINUED] doxycycline (VIBRA-TABS) 100 MG tablet Take 1 tablet (100 mg total) by mouth 2 (two) times daily. (Patient not taking: Reported on 10/02/2014)   No  facility-administered encounter medications on file as of 01/06/2015.    Review of Systems  Constitutional: Negative for fever and appetite change.  HENT: Negative for congestion and sinus pressure.   Eyes: Negative for pain and visual disturbance.  Respiratory: Negative for cough, chest tightness and shortness of breath.   Cardiovascular: Negative for chest pain, palpitations and leg swelling.  Gastrointestinal: Negative for nausea, vomiting, abdominal pain (none now.  occasional discomfort as outlined.  ) and diarrhea.  Genitourinary: Negative for dysuria and difficulty urinating.  Musculoskeletal: Negative for back pain and joint swelling.  Skin: Negative for color change and rash.  Neurological: Negative for dizziness, light-headedness and  headaches.  Hematological: Negative for adenopathy. Does not bruise/bleed easily.  Psychiatric/Behavioral: Negative for dysphoric mood and agitation.       Increased stress as outlined.         Objective:    Physical Exam  Constitutional: He is oriented to person, place, and time. He appears well-developed and well-nourished. No distress.  HENT:  Head: Normocephalic and atraumatic.  Nose: Nose normal.  Mouth/Throat: Oropharynx is clear and moist. No oropharyngeal exudate.  Eyes: Conjunctivae are normal. Right eye exhibits no discharge. Left eye exhibits no discharge.  Neck: Neck supple.  Right thyroid fullness.   Cardiovascular: Normal rate and regular rhythm.   Pulmonary/Chest: Breath sounds normal. No respiratory distress. He has no wheezes.  Abdominal: Soft. Bowel sounds are normal. There is no tenderness.  Genitourinary:  Rectal exam reveals enlarged prostate.  Heme negative.   Musculoskeletal: He exhibits no edema or tenderness.  Lymphadenopathy:    He has no cervical adenopathy.  Neurological: He is alert and oriented to person, place, and time.  Skin: Skin is warm and dry. No rash noted. No erythema.  Psychiatric: He has a normal mood and affect. His behavior is normal.    BP 122/80 mmHg  Pulse 57  Temp(Src) 98.4 F (36.9 C) (Oral)  Resp 17  Ht 5' 9" (1.753 m)  Wt 197 lb (89.359 kg)  BMI 29.08 kg/m2  SpO2 96% Wt Readings from Last 3 Encounters:  01/06/15 197 lb (89.359 kg)  10/02/14 185 lb (83.915 kg)  12/14/13 188 lb 8 oz (85.503 kg)     Lab Results  Component Value Date   WBC 6.5 07/03/2013   HGB 13.8 07/03/2013   HCT 41.0 07/03/2013   PLT 153.0 07/03/2013   GLUCOSE 93 07/03/2013   CHOL 169 07/03/2013   TRIG 61.0 07/03/2013   HDL 83.70 07/03/2013   LDLCALC 73 07/03/2013   ALT 25 07/03/2013   AST 30 07/03/2013   NA 137 07/03/2013   K 4.6 07/03/2013   CL 100 07/03/2013   CREATININE 1.0 07/03/2013   BUN 16 07/03/2013   CO2 30 07/03/2013   PSA  3.79 07/03/2013   HGBA1C 5.7 07/03/2013       Assessment & Plan:   Problem List Items Addressed This Visit    Abdominal pain    None on exam.  Only notices if over exerts.  No chest pain or tightness.  Will cut back some and see if improves.  Noticed more with walking 63mles per day.  Reports as just soreness.  Bowels doing well.  No inguinal pain now.  Noticed previously for several days when lifted excessive weight.  Follow.       Relevant Orders   CBC with Differential/Platelet   Health care maintenance    Colonoscopy 10/02/14 internal hemorrhoids - otherwise normal.  Recommended f/u colonoscopy  in 10 years.  Check psa.  Physical today 01/06/15.        Hypercholesterolemia    On lipitor.  Low cholesterol diet and exercise.  Follow lipid panel and liver function tests.        Relevant Medications   atorvastatin (LIPITOR) 40 MG tablet   Other Relevant Orders   Lipid panel   Hepatic function panel   Hyperglycemia    Low carb diet and exercise.  Follow met b and a1c.       Relevant Orders   Hemoglobin D1V   Basic metabolic panel   Thyroid nodule    Previous biopsy negative.  Seeing Dr Eddie Dibbles.  Recommended f/u yearly.  States will check on appt.        Relevant Orders   TSH    Other Visit Diagnoses    Routine general medical examination at a health care facility    -  Primary    Encounter for immunization        Prostate cancer screening        Relevant Orders    PSA, Medicare        Einar Pheasant, MD

## 2015-01-06 NOTE — Progress Notes (Signed)
Pre-visit discussion using our clinic review tool. No additional management support is needed unless otherwise documented below in the visit note.  

## 2015-01-06 NOTE — Assessment & Plan Note (Signed)
Colonoscopy 10/02/14 internal hemorrhoids - otherwise normal.  Recommended f/u colonoscopy in 10 years.  Check psa.  Physical today 01/06/15.

## 2015-01-06 NOTE — Patient Instructions (Signed)

## 2015-01-06 NOTE — Assessment & Plan Note (Signed)
On lipitor.  Low cholesterol diet and exercise.  Follow lipid panel and liver function tests.   

## 2015-01-07 ENCOUNTER — Other Ambulatory Visit: Payer: Self-pay | Admitting: Internal Medicine

## 2015-01-07 DIAGNOSIS — D696 Thrombocytopenia, unspecified: Secondary | ICD-10-CM

## 2015-01-07 NOTE — Progress Notes (Signed)
Order placed for f/u platelet count.  

## 2015-01-08 ENCOUNTER — Encounter: Payer: Self-pay | Admitting: Internal Medicine

## 2015-01-08 DIAGNOSIS — R972 Elevated prostate specific antigen [PSA]: Secondary | ICD-10-CM

## 2015-01-08 NOTE — Telephone Encounter (Signed)
Order placed for urology referral.  

## 2015-01-15 ENCOUNTER — Encounter: Payer: Self-pay | Admitting: Internal Medicine

## 2015-01-21 ENCOUNTER — Other Ambulatory Visit (INDEPENDENT_AMBULATORY_CARE_PROVIDER_SITE_OTHER): Payer: Medicare Other

## 2015-01-21 DIAGNOSIS — D696 Thrombocytopenia, unspecified: Secondary | ICD-10-CM

## 2015-01-21 LAB — PLATELET COUNT: Platelets: 168 10*3/uL (ref 150–400)

## 2015-01-22 ENCOUNTER — Encounter: Payer: Self-pay | Admitting: Internal Medicine

## 2015-02-27 DIAGNOSIS — R972 Elevated prostate specific antigen [PSA]: Secondary | ICD-10-CM | POA: Insufficient documentation

## 2015-02-27 DIAGNOSIS — R339 Retention of urine, unspecified: Secondary | ICD-10-CM | POA: Insufficient documentation

## 2015-03-02 ENCOUNTER — Encounter: Payer: Self-pay | Admitting: Internal Medicine

## 2015-03-03 NOTE — Telephone Encounter (Signed)
Given the nature of his message, please call him and confirm that he is not having any other acute symptoms - i.e., chest pain, sob, etc.  If no acute symptoms (that would necessitate an earlier appt), then I can see him tomorrow at 2:30.  There is a spot being held for work in.  Please schedule.  Thanks.

## 2015-03-04 ENCOUNTER — Ambulatory Visit (INDEPENDENT_AMBULATORY_CARE_PROVIDER_SITE_OTHER): Payer: Medicare Other | Admitting: Internal Medicine

## 2015-03-04 ENCOUNTER — Encounter: Payer: Self-pay | Admitting: Internal Medicine

## 2015-03-04 VITALS — BP 122/80 | HR 62 | Temp 98.1°F | Resp 18 | Ht 69.0 in | Wt 200.0 lb

## 2015-03-04 DIAGNOSIS — R1084 Generalized abdominal pain: Secondary | ICD-10-CM

## 2015-03-04 DIAGNOSIS — R0789 Other chest pain: Secondary | ICD-10-CM | POA: Diagnosis not present

## 2015-03-04 DIAGNOSIS — R739 Hyperglycemia, unspecified: Secondary | ICD-10-CM | POA: Diagnosis not present

## 2015-03-04 DIAGNOSIS — M5489 Other dorsalgia: Secondary | ICD-10-CM

## 2015-03-04 DIAGNOSIS — R030 Elevated blood-pressure reading, without diagnosis of hypertension: Secondary | ICD-10-CM

## 2015-03-04 DIAGNOSIS — IMO0001 Reserved for inherently not codable concepts without codable children: Secondary | ICD-10-CM

## 2015-03-04 DIAGNOSIS — E7800 Pure hypercholesterolemia, unspecified: Secondary | ICD-10-CM | POA: Diagnosis not present

## 2015-03-04 NOTE — Progress Notes (Signed)
Pre-visit discussion using our clinic review tool. No additional management support is needed unless otherwise documented below in the visit note.  

## 2015-03-04 NOTE — Progress Notes (Signed)
Patient ID: [name], male   DOB: [birth year], 69 y.o.   MRN: [id number]   Subjective:    Patient ID: Nathan Watts, male    DOB: 01-May-1945, 69 y.o.   MRN: [id number]  HPI  Patient with past history of hypercholesterolemia, previous hyperglycemia, thyroid nodule and hypertension.  He comes in today as a work in to discuss his elevated blood pressure and not feeling well.  States that for the last 3-4 weeks, he has noticed some pain in her abdomen and back.  Feels the discomfort on both sides (abdomen)/flank.  This may improve some with eating.  No vomiting.  No bowel change or urinary symptoms.  Just evaluated by urology for elevated psa.  See their note for details.  Denies urinary hesitancy.  No fever.  No cough or congestion.  He is still walking.  Has cut down on the amount of walking he does daily.  Previously noticed some chest tightness.  No increased sob.  Just does not feel as well.  Feels the discomfort is worse in the evening.  Also has noticed worse lying on right side.  Blood pressure has been elevated.  Readings:  160/78, 172/80, 159/80 and has for the last couple of day drifted back down:  138/72, 128/0 and 105/56.    Past Medical History  Diagnosis Date  . Hypercholesterolemia   . Thrombocytopenia ([place])   . Hyperglycemia   . Thyroid nodule   . GERD (gastroesophageal reflux disease)   . Anemia, unspecified   . Hypertension    Past Surgical History  Procedure Laterality Date  . Tonsillectomy    . Inguinal hernia repair    . Colonoscopy with propofol N/A 10/02/2014    Procedure: COLONOSCOPY WITH PROPOFOL;  Surgeon: Manya Silvas, MD;  Location: [facility] ENDOSCOPY;  Service: Endoscopy;  Laterality: N/A;   Family History  Problem Relation Age of Onset  . Prostate cancer Father   . Heart disease Father     s/p CABG  . Stroke Mother   . Colon cancer Neg Hx    Social History   Social History  . Marital Status: Married    Spouse Name: N/A  . Number of Children: N/A  .  Years of Education: N/A   Social History Main Topics  . Smoking status: Former Smoker    Quit date: 04/12/1998  . Smokeless tobacco: Never Used  . Alcohol Use: 0.0 oz/week    0 Standard drinks or equivalent per week     Comment: a few drinks a week  . Drug Use: No  . Sexual Activity: No   Other Topics Concern  . None   Social History Narrative   He is married. Has four children    Outpatient Encounter Prescriptions as of 03/04/2015  Medication Sig  . aspirin 81 MG tablet Take 81 mg by mouth daily.  Marland Kitchen atorvastatin (LIPITOR) 40 MG tablet TAKE 1 BY MOUTH DAILY  . cyanocobalamin 500 MCG tablet Take 500 mcg by mouth daily.  . Multiple Vitamin (MULTI VITAMIN MENS PO) Take 1 tablet by mouth daily.  . Omega-3 Fatty Acids (FISH OIL) 1200 MG CAPS Take 1 capsule by mouth daily.  . Omeprazole Magnesium (PRILOSEC OTC PO) Take by mouth daily.  . tamsulosin (FLOMAX) 0.4 MG CAPS capsule Take 0.4 mg by mouth daily.   No facility-administered encounter medications on file as of 03/04/2015.    Review of Systems  Constitutional: Negative for fever.       Weight gain.  Not exercising as much.  Has decreased the amount of walking.    HENT: Negative for congestion and sinus pressure.   Respiratory: Negative for cough and shortness of breath.        Noted previous chest tightness.   Cardiovascular: Negative for chest pain and palpitations.  Gastrointestinal: Positive for abdominal pain. Negative for nausea, vomiting and diarrhea.  Genitourinary: Negative for dysuria and difficulty urinating.  Musculoskeletal: Negative for joint swelling and neck pain.       Flank pain as outlined.   Skin: Negative for color change and rash.  Neurological: Negative for dizziness, light-headedness and headaches.  Psychiatric/Behavioral: Negative for dysphoric mood and agitation.       Objective:     Blood pressure rechecked by me:  158/86  Physical Exam  Constitutional: He appears well-developed and  well-nourished.  HENT:  Nose: Nose normal.  Mouth/Throat: Oropharynx is clear and moist.  Eyes: Conjunctivae are normal. Right eye exhibits no discharge. Left eye exhibits discharge.  Neck: Neck supple.  Palpable right thyroid nodule.   Cardiovascular: Normal rate and regular rhythm.   Pulmonary/Chest: Effort normal and breath sounds normal. No respiratory distress.  Abdominal: Soft. Bowel sounds are normal.  Increased pain to palpation over the mid abdomen and bilateral sides.    Musculoskeletal: He exhibits no edema or tenderness.  No tenderness to palpation over the spine.    Lymphadenopathy:    He has no cervical adenopathy.  Skin: No rash noted. No erythema.  Psychiatric: He has a normal mood and affect. His behavior is normal.    BP 122/80 mmHg  Pulse 62  Temp(Src) 98.1 F (36.7 C) (Oral)  Resp 18  Ht _0  (1.753 m)  Wt 200 lb (90.719 kg)  BMI 29.52 kg/m2  SpO2 98% Wt Readings from Last 3 Encounters:  03/04/15 200 lb (90.719 kg)  01/06/15 197 lb (89.359 kg)  10/02/14 185 lb (83.915 kg)     Lab Results  Component Value Date   WBC 7.3 01/06/2015   HGB 14.2 01/06/2015   HCT 43.6 01/06/2015   PLT 168 01/21/2015   GLUCOSE 105* 01/06/2015   CHOL 189 01/06/2015   TRIG 63.0 01/06/2015   HDL 88.70 01/06/2015   LDLCALC 88 01/06/2015   ALT 36 01/06/2015   AST 33 01/06/2015   NA 141 01/06/2015   K 4.4 01/06/2015   CL 102 01/06/2015   CREATININE 0.86 01/06/2015   BUN 19 01/06/2015   CO2 29 01/06/2015   TSH 1.60 01/06/2015   PSA 4.44* 01/06/2015   HGBA1C 5.6 01/06/2015       Assessment & Plan:   Problem List Items Addressed This Visit    Abdominal pain    Abdominal pain and flank pain as outlined.  Increased pain to palpation.  Present for 3-4 weeks now.  Unclear etiology.  Eating.  Bowels stable.  Schedule CT abdomen and pelvis.  Continue prilosec.       Relevant Orders   CBC with Differential/Platelet   Hepatic function panel   Basic metabolic panel    CT Abdomen Pelvis W Contrast   Back pain    Flank pain as outlined.  Check CT as outlined.        Chest tightness - Primary    Previous chest tightness.  Still walking.  Has decreased the amount of daily walking recently.  Does not feel as well.  With risk factors and symptoms, EKG obtained and revealed SR with no acute ischemic changes.  After discussion, will have cardiology evaluate with question of need for further cardiac testing.  Continue risk factor modification.       Relevant Orders   EKG 12-Lead (Completed)   Ambulatory referral to Cardiology   Elevated blood pressure    Blood pressure as outlined.  Elevated on my check, but his checks have normalized.  Hold on medication.  Have him spot check his pressure.  Get him back in soon to reassess.        Hypercholesterolemia    On lipitor.  Low cholesterol diet.  Follow lipid panel and liver function tests.   Lab Results  Component Value Date   CHOL 189 01/06/2015   HDL 88.70 01/06/2015   LDLCALC 88 01/06/2015   TRIG 63.0 01/06/2015   CHOLHDL 2 01/06/2015        Hyperglycemia    Has gained weight.  Low carb diet.  Follow met b and a1c.           Einar Pheasant, MD

## 2015-03-05 ENCOUNTER — Encounter: Payer: Self-pay | Admitting: Internal Medicine

## 2015-03-05 ENCOUNTER — Telehealth: Payer: Self-pay | Admitting: *Deleted

## 2015-03-05 ENCOUNTER — Other Ambulatory Visit: Payer: Self-pay | Admitting: Internal Medicine

## 2015-03-05 DIAGNOSIS — I10 Essential (primary) hypertension: Secondary | ICD-10-CM | POA: Insufficient documentation

## 2015-03-05 DIAGNOSIS — R1084 Generalized abdominal pain: Secondary | ICD-10-CM

## 2015-03-05 DIAGNOSIS — R0789 Other chest pain: Secondary | ICD-10-CM | POA: Insufficient documentation

## 2015-03-05 LAB — CBC WITH DIFFERENTIAL/PLATELET
Basophils Absolute: 0 10*3/uL (ref 0.0–0.1)
Basophils Relative: 0.4 % (ref 0.0–3.0)
EOS ABS: 0.1 10*3/uL (ref 0.0–0.7)
EOS PCT: 1.5 % (ref 0.0–5.0)
HEMATOCRIT: 40.3 % (ref 39.0–52.0)
HEMOGLOBIN: 13.5 g/dL (ref 13.0–17.0)
LYMPHS PCT: 25.4 % (ref 12.0–46.0)
Lymphs Abs: 1.6 10*3/uL (ref 0.7–4.0)
MCHC: 33.5 g/dL (ref 30.0–36.0)
MCV: 92.3 fl (ref 78.0–100.0)
MONO ABS: 0.6 10*3/uL (ref 0.1–1.0)
Monocytes Relative: 9.7 % (ref 3.0–12.0)
Neutro Abs: 4.1 10*3/uL (ref 1.4–7.7)
Neutrophils Relative %: 63 % (ref 43.0–77.0)
Platelets: 125 10*3/uL — ABNORMAL LOW (ref 150.0–400.0)
RBC: 4.37 Mil/uL (ref 4.22–5.81)
RDW: 13.6 % (ref 11.5–15.5)
WBC: 6.4 10*3/uL (ref 4.0–10.5)

## 2015-03-05 LAB — BASIC METABOLIC PANEL
BUN: 16 mg/dL (ref 6–23)
CALCIUM: 9.4 mg/dL (ref 8.4–10.5)
CO2: 28 mEq/L (ref 19–32)
CREATININE: 0.89 mg/dL (ref 0.40–1.50)
Chloride: 100 mEq/L (ref 96–112)
GFR: 89.69 mL/min (ref >60.00)
GLUCOSE: 65 mg/dL — AB (ref 70–99)
Potassium: 4.3 mEq/L (ref 3.5–5.1)
Sodium: 138 mEq/L (ref 135–145)

## 2015-03-05 LAB — HEPATIC FUNCTION PANEL
ALT: 28 U/L (ref 0–53)
AST: 35 U/L (ref 0–37)
Albumin: 4.2 g/dL (ref 3.5–5.2)
Alkaline Phosphatase: 45 U/L (ref 39–117)
BILIRUBIN DIRECT: 0.2 mg/dL (ref 0.0–0.3)
BILIRUBIN TOTAL: 0.9 mg/dL (ref 0.2–1.2)
TOTAL PROTEIN: 7.1 g/dL (ref 6.0–8.3)

## 2015-03-05 NOTE — Assessment & Plan Note (Signed)
Abdominal pain and flank pain as outlined.  Increased pain to palpation.  Present for 3-4 weeks now.  Unclear etiology.  Eating.  Bowels stable.  Schedule CT abdomen and pelvis.  Continue prilosec.

## 2015-03-05 NOTE — Assessment & Plan Note (Signed)
Flank pain as outlined.  Check CT as outlined.

## 2015-03-05 NOTE — Telephone Encounter (Signed)
Patient has requested a call back to make sure if he needed to take contrast before his appt. Patient state that [facility] called him, questioning if he should have contrast. Please Advise

## 2015-03-05 NOTE — Assessment & Plan Note (Signed)
Previous chest tightness.  Still walking.  Has decreased the amount of daily walking recently.  Does not feel as well.  With risk factors and symptoms, EKG obtained and revealed SR with no acute ischemic changes.  After discussion, will have cardiology evaluate with question of need for further cardiac testing.  Continue risk factor modification.

## 2015-03-05 NOTE — Assessment & Plan Note (Signed)
On lipitor.  Low cholesterol diet.  Follow lipid panel and liver function tests.   Lab Results  Component Value Date   CHOL 189 01/06/2015   HDL 88.70 01/06/2015   LDLCALC 88 01/06/2015   TRIG 63.0 01/06/2015   CHOLHDL 2 01/06/2015

## 2015-03-05 NOTE — Progress Notes (Signed)
Order placed for CT scan.  

## 2015-03-05 NOTE — Assessment & Plan Note (Signed)
Has gained weight.  Low carb diet.  Follow met b and a1c.

## 2015-03-05 NOTE — Telephone Encounter (Signed)
Spoke with the patient

## 2015-03-05 NOTE — Assessment & Plan Note (Signed)
Blood pressure as outlined.  Elevated on my check, but his checks have normalized.  Hold on medication.  Have him spot check his pressure.  Get him back in soon to reassess.

## 2015-03-07 ENCOUNTER — Ambulatory Visit
Admission: RE | Admit: 2015-03-07 | Discharge: 2015-03-07 | Disposition: A | Discharge status: Home / Self Care | Payer: Medicare Other | Source: Ambulatory Visit | Attending: Internal Medicine | Admitting: Internal Medicine

## 2015-03-07 DIAGNOSIS — K449 Diaphragmatic hernia without obstruction or gangrene: Secondary | ICD-10-CM | POA: Insufficient documentation

## 2015-03-07 DIAGNOSIS — N281 Cyst of kidney, acquired: Secondary | ICD-10-CM | POA: Diagnosis not present

## 2015-03-07 DIAGNOSIS — K760 Fatty (change of) liver, not elsewhere classified: Secondary | ICD-10-CM | POA: Diagnosis not present

## 2015-03-07 DIAGNOSIS — R1084 Generalized abdominal pain: Secondary | ICD-10-CM | POA: Insufficient documentation

## 2015-03-07 DIAGNOSIS — K769 Liver disease, unspecified: Secondary | ICD-10-CM | POA: Diagnosis not present

## 2015-03-07 DIAGNOSIS — N400 Enlarged prostate without lower urinary tract symptoms: Secondary | ICD-10-CM | POA: Diagnosis not present

## 2015-03-07 MED ORDER — IOHEXOL 350 MG/ML SOLN
100.0000 mL | Freq: Once | INTRAVENOUS | Status: AC | PRN
  Administered 2015-03-07: 100 mL via INTRAVENOUS

## 2015-03-09 ENCOUNTER — Other Ambulatory Visit: Payer: Self-pay | Admitting: Internal Medicine

## 2015-03-09 DIAGNOSIS — I700 Atherosclerosis of aorta: Secondary | ICD-10-CM

## 2015-03-09 DIAGNOSIS — I719 Aortic aneurysm of unspecified site, without rupture: Secondary | ICD-10-CM

## 2015-03-09 DIAGNOSIS — R1084 Generalized abdominal pain: Secondary | ICD-10-CM

## 2015-03-09 DIAGNOSIS — D696 Thrombocytopenia, unspecified: Secondary | ICD-10-CM

## 2015-03-09 NOTE — Progress Notes (Signed)
Order placed for vascular surgery referral.   

## 2015-03-09 NOTE — Progress Notes (Signed)
Order placed for f/u cbc.   

## 2015-03-13 ENCOUNTER — Encounter: Payer: Self-pay | Admitting: Cardiovascular Disease

## 2015-03-13 ENCOUNTER — Other Ambulatory Visit (INDEPENDENT_AMBULATORY_CARE_PROVIDER_SITE_OTHER): Payer: Medicare Other

## 2015-03-13 ENCOUNTER — Ambulatory Visit (INDEPENDENT_AMBULATORY_CARE_PROVIDER_SITE_OTHER): Payer: Medicare Other | Admitting: Cardiovascular Disease

## 2015-03-13 VITALS — BP 140/70 | HR 64 | Ht 71.0 in | Wt 198.8 lb

## 2015-03-13 DIAGNOSIS — I2510 Atherosclerotic heart disease of native coronary artery without angina pectoris: Secondary | ICD-10-CM | POA: Insufficient documentation

## 2015-03-13 DIAGNOSIS — Z87891 Personal history of nicotine dependence: Secondary | ICD-10-CM | POA: Insufficient documentation

## 2015-03-13 DIAGNOSIS — R0789 Other chest pain: Secondary | ICD-10-CM

## 2015-03-13 DIAGNOSIS — D696 Thrombocytopenia, unspecified: Secondary | ICD-10-CM

## 2015-03-13 DIAGNOSIS — IMO0001 Reserved for inherently not codable concepts without codable children: Secondary | ICD-10-CM

## 2015-03-13 DIAGNOSIS — R030 Elevated blood-pressure reading, without diagnosis of hypertension: Secondary | ICD-10-CM

## 2015-03-13 DIAGNOSIS — R109 Unspecified abdominal pain: Secondary | ICD-10-CM | POA: Diagnosis not present

## 2015-03-13 DIAGNOSIS — Z720 Tobacco use: Secondary | ICD-10-CM

## 2015-03-13 DIAGNOSIS — I700 Atherosclerosis of aorta: Secondary | ICD-10-CM | POA: Diagnosis not present

## 2015-03-13 DIAGNOSIS — E7800 Pure hypercholesterolemia, unspecified: Secondary | ICD-10-CM

## 2015-03-13 LAB — CBC WITH DIFFERENTIAL/PLATELET
Basophils Absolute: 0 10*3/uL (ref 0.0–0.1)
Basophils Relative: 0.5 % (ref 0.0–3.0)
EOS PCT: 2 % (ref 0.0–5.0)
Eosinophils Absolute: 0.1 10*3/uL (ref 0.0–0.7)
HEMATOCRIT: 42.1 % (ref 39.0–52.0)
Hemoglobin: 13.9 g/dL (ref 13.0–17.0)
LYMPHS ABS: 1.7 10*3/uL (ref 0.7–4.0)
LYMPHS PCT: 23.3 % (ref 12.0–46.0)
MCHC: 33.1 g/dL (ref 30.0–36.0)
MCV: 93 fl (ref 78.0–100.0)
MONOS PCT: 13.5 % — AB (ref 3.0–12.0)
Monocytes Absolute: 1 10*3/uL (ref 0.1–1.0)
NEUTROS ABS: 4.3 10*3/uL (ref 1.4–7.7)
NEUTROS PCT: 60.7 % (ref 43.0–77.0)
Platelets: 138 10*3/uL — ABNORMAL LOW (ref 150.0–400.0)
RBC: 4.52 Mil/uL (ref 4.22–5.81)
RDW: 13.9 % (ref 11.5–15.5)
WBC: 7.1 10*3/uL (ref 4.0–10.5)

## 2015-03-13 MED ORDER — EZETIMIBE 10 MG PO TABS: 10.0000 mg | ORAL_TABLET | Freq: Every day | ORAL | Status: DC

## 2015-03-13 NOTE — Assessment & Plan Note (Signed)
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 Images reviewed with him Region of aortic ulceration. Stressed him the importance of aggressive cholesterol control. He reports he has been seen by Dr. dew and has repeat CT scan scheduled for early next year

## 2015-03-13 NOTE — Assessment & Plan Note (Signed)
On discussion today, he reports most of his symptoms seem to be bilateral flank pain below his ribs extending to his back, presenting at rest. Atypical in general. After long discussion about his symptoms, the stress test has been ordered. Recommended that he call us if he develops any worsening symptoms or symptoms with exertion.  Flank pain symptoms concerning for myalgias. Recommended he hold his Lipitor for several weeks to see if symptoms improve

## 2015-03-13 NOTE — Assessment & Plan Note (Signed)
Cholesterol has been climbing over the past several years. Unclear if Lipitor is causing his muscle aches through his abdomen and flank. He will hold the Lipitor for several weeks to see if symptoms improve. If they do, he is willing to retry Crestor. Prescription for Zetia 10 mg has been sent in and recommended he start this at a later date in attempt to reach goal LDL less than 70. Most recently was above goal

## 2015-03-13 NOTE — Progress Notes (Signed)
Patient ID: Nathan Watts, male    DOB: 1945/06/28, 69 y.o.   MRN: [id number]  HPI Comments:  Nathan Watts is a pleasant 69 year old gentleman with long history of smoking for at least 30 years, stopped in 2000, hyperlipidemia, who presents for evaluation of flank pain, occasional chest discomfort.  He reports that he is very active, typically can walk 6 miles per day with no symptoms of shortness of breath or chest pain. Over the past several weeks to months, has felt some discomfort across the bottom of his ribs, upper abdominal area, radiating around to his back bilaterally, rarely up into his chest. Symptoms will present typically at rest  He is able to lift heavy items without any significant difficulty. 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 Jacqlyn Larsen (for elevated PSA) Blood pressure continued to run high even after his visit. Prior to and then since then systolic pressure typically in the 120 up to [id number] range systolic over [id number]  Recent CT scan revealing renal cyst, aortic ulcer, diffuse moderate PAD of the aorta, coronary artery disease noted in the distal RCA, mid LAD  Currently taking Lipitor 40 mg daily, cholesterol has been slowly rising over the past several years  EKG done 03/04/2015 showing normal sinus rhythm with rate 57 bpm, no significant ST or T-wave changes   Allergies  Allergen Reactions  . Crestor [Rosuvastatin]     Current Outpatient Prescriptions on File Prior to Visit  Medication Sig Dispense Refill  . aspirin 81 MG tablet Take 81 mg by mouth daily.    Marland Kitchen atorvastatin (LIPITOR) 40 MG tablet TAKE 1 BY MOUTH DAILY 90 tablet 3  . cyanocobalamin 500 MCG tablet Take 500 mcg by mouth daily.    . Multiple Vitamin (MULTI VITAMIN MENS PO) Take 1 tablet by mouth  daily.    . Omega-3 Fatty Acids (FISH OIL) 1200 MG CAPS Take 1 capsule by mouth daily.    . Omeprazole Magnesium (PRILOSEC OTC PO) Take by mouth daily.    . tamsulosin (FLOMAX) 0.4 MG CAPS capsule Take 0.4 mg by mouth daily.     No current facility-administered medications on file prior to visit.    Past Medical History  Diagnosis Date  . Hypercholesterolemia   . Thrombocytopenia ([place])   . Hyperglycemia   . Thyroid nodule   . GERD (gastroesophageal reflux disease)   . Anemia, unspecified   . Hypertension     Past Surgical History  Procedure Laterality Date  . Tonsillectomy    . Inguinal hernia repair    . Colonoscopy with propofol N/A 10/02/2014    Procedure: COLONOSCOPY WITH PROPOFOL;  Surgeon: Manya Silvas, MD;  Location: [facility] ENDOSCOPY;  Service: Endoscopy;  Laterality: N/A;    Social History  reports that he quit smoking about 16 years ago. He has never used smokeless tobacco. He reports that he drinks alcohol. He reports that he does not use illicit drugs.  Family History family history includes Heart disease in his father; Prostate cancer in his father; Stroke in his mother. There is no history of Colon cancer.      Review of Systems  Constitutional: Negative.   HENT: Negative.   Eyes: Negative.   Respiratory: Negative.   Cardiovascular: Negative.   Gastrointestinal: Negative.   Endocrine: Negative.  Musculoskeletal: Positive for myalgias.  Skin: Negative.   Allergic/Immunologic: Negative.   Neurological: Negative.   Hematological: Negative.   Psychiatric/Behavioral: Negative.   All other systems reviewed and are negative.   BP 140/70 mmHg  Pulse 64  Ht 5\' 11"  (1.803 m)  Wt 198 lb 12 oz (90.152 kg)  BMI 27.73 kg/m2  Physical Exam  Constitutional: He is oriented to person, place, and time. He appears well-developed and well-nourished.  HENT:  Head: Normocephalic.  Nose: Nose normal.  Mouth/Throat: Oropharynx is clear and moist.  Eyes:  Conjunctivae are normal. Pupils are equal, round, and reactive to light.  Neck: Normal range of motion. Neck supple. No JVD present.  Cardiovascular: Normal rate, regular rhythm, normal heart sounds and intact distal pulses.  Exam reveals no gallop and no friction rub.   No murmur heard. Pulmonary/Chest: Effort normal and breath sounds normal. No respiratory distress. He has no wheezes. He has no rales. He exhibits no tenderness.  Abdominal: Soft. Bowel sounds are normal. He exhibits no distension. There is no tenderness.  Musculoskeletal: Normal range of motion. He exhibits no edema or tenderness.  Lymphadenopathy:    He has no cervical adenopathy.  Neurological: He is alert and oriented to person, place, and time. Coordination normal.  Skin: Skin is warm and dry. No rash noted. No erythema.  Psychiatric: He has a normal mood and affect. His behavior is normal. Judgment and thought content normal.

## 2015-03-13 NOTE — Patient Instructions (Addendum)
You are doing well.  Please hold the lipitor for a few weeks to see if flank pain gets better If no better, restart the lipitor  Please start the zetia for cholesterol in a few weeks  Please call us if you have new issues that need to be addressed before your next appt.  Follow up in 1 year or earlier if you have any symptoms concerning for angina

## 2015-03-13 NOTE — Assessment & Plan Note (Signed)
Unable to see the entire heart but he does have underlying coronary artery disease, notably significant plaque in the distal RCA. He reports he is asymptomatic, able to walk long distances without symptoms concerning for angina. Again we have stressed importance of very low cholesterol. Currently a nonsmoker, no diabetes. Recommended he call us for any anginal symptoms. These were discussed with him in detail

## 2015-03-13 NOTE — Assessment & Plan Note (Signed)
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

## 2015-03-14 ENCOUNTER — Encounter: Payer: Self-pay | Admitting: *Deleted

## 2015-03-14 ENCOUNTER — Other Ambulatory Visit: Payer: Self-pay | Admitting: Internal Medicine

## 2015-03-14 DIAGNOSIS — D696 Thrombocytopenia, unspecified: Secondary | ICD-10-CM

## 2015-03-14 NOTE — Progress Notes (Signed)
Order placed for f/u cbc.   

## 2015-03-19 ENCOUNTER — Telehealth: Payer: Self-pay | Admitting: Internal Medicine

## 2015-03-19 NOTE — Telephone Encounter (Signed)
Called Dr Cope's office.  Spoke to [place].  Informed of CT findings.  Bilateral renal cysts (largest 4.3cm).  Seeing urology.  Asking for Dr Jacqlyn Larsen to follow.

## 2015-04-02 ENCOUNTER — Encounter: Payer: Self-pay | Admitting: Internal Medicine

## 2015-04-10 ENCOUNTER — Other Ambulatory Visit (INDEPENDENT_AMBULATORY_CARE_PROVIDER_SITE_OTHER): Payer: Medicare Other

## 2015-04-10 DIAGNOSIS — D696 Thrombocytopenia, unspecified: Secondary | ICD-10-CM | POA: Diagnosis not present

## 2015-04-10 LAB — CBC WITH DIFFERENTIAL/PLATELET
BASOS ABS: 0 10*3/uL (ref 0.0–0.1)
Basophils Relative: 0.3 % (ref 0.0–3.0)
EOS PCT: 1.7 % (ref 0.0–5.0)
Eosinophils Absolute: 0.1 10*3/uL (ref 0.0–0.7)
HCT: 42.6 % (ref 39.0–52.0)
HEMOGLOBIN: 14.3 g/dL (ref 13.0–17.0)
Lymphocytes Relative: 23.6 % (ref 12.0–46.0)
Lymphs Abs: 1.9 10*3/uL (ref 0.7–4.0)
MCHC: 33.5 g/dL (ref 30.0–36.0)
MCV: 91.6 fl (ref 78.0–100.0)
MONO ABS: 0.6 10*3/uL (ref 0.1–1.0)
MONOS PCT: 8.1 % (ref 3.0–12.0)
Neutro Abs: 5.3 10*3/uL (ref 1.4–7.7)
Neutrophils Relative %: 66.3 % (ref 43.0–77.0)
Platelets: 147 10*3/uL — ABNORMAL LOW (ref 150.0–400.0)
RBC: 4.65 Mil/uL (ref 4.22–5.81)
RDW: 14.1 % (ref 11.5–15.5)
WBC: 8 10*3/uL (ref 4.0–10.5)

## 2015-04-11 ENCOUNTER — Other Ambulatory Visit: Payer: Self-pay | Admitting: Internal Medicine

## 2015-04-11 DIAGNOSIS — R030 Elevated blood-pressure reading, without diagnosis of hypertension: Principal | ICD-10-CM

## 2015-04-11 DIAGNOSIS — IMO0001 Reserved for inherently not codable concepts without codable children: Secondary | ICD-10-CM

## 2015-04-11 DIAGNOSIS — D696 Thrombocytopenia, unspecified: Secondary | ICD-10-CM

## 2015-04-11 DIAGNOSIS — R739 Hyperglycemia, unspecified: Secondary | ICD-10-CM

## 2015-04-11 DIAGNOSIS — E7800 Pure hypercholesterolemia, unspecified: Secondary | ICD-10-CM

## 2015-04-11 NOTE — Progress Notes (Signed)
Orders placed for f/u labs.  

## 2015-05-05 ENCOUNTER — Ambulatory Visit: Payer: Medicare Other | Admitting: Cardiovascular Disease

## 2015-05-06 DIAGNOSIS — N411 Chronic prostatitis: Secondary | ICD-10-CM | POA: Insufficient documentation

## 2015-05-13 ENCOUNTER — Other Ambulatory Visit (INDEPENDENT_AMBULATORY_CARE_PROVIDER_SITE_OTHER): Payer: Medicare Other

## 2015-05-13 DIAGNOSIS — R739 Hyperglycemia, unspecified: Secondary | ICD-10-CM | POA: Diagnosis not present

## 2015-05-13 DIAGNOSIS — E7800 Pure hypercholesterolemia, unspecified: Secondary | ICD-10-CM | POA: Diagnosis not present

## 2015-05-13 DIAGNOSIS — D696 Thrombocytopenia, unspecified: Secondary | ICD-10-CM

## 2015-05-13 DIAGNOSIS — IMO0001 Reserved for inherently not codable concepts without codable children: Secondary | ICD-10-CM

## 2015-05-13 DIAGNOSIS — R030 Elevated blood-pressure reading, without diagnosis of hypertension: Secondary | ICD-10-CM

## 2015-05-13 LAB — CBC WITH DIFFERENTIAL/PLATELET
BASOS PCT: 0.5 % (ref 0.0–3.0)
Basophils Absolute: 0 10*3/uL (ref 0.0–0.1)
EOS PCT: 2.7 % (ref 0.0–5.0)
Eosinophils Absolute: 0.2 10*3/uL (ref 0.0–0.7)
HEMATOCRIT: 44.4 % (ref 39.0–52.0)
HEMOGLOBIN: 14.7 g/dL (ref 13.0–17.0)
LYMPHS PCT: 32 % (ref 12.0–46.0)
Lymphs Abs: 2.1 10*3/uL (ref 0.7–4.0)
MCHC: 33.2 g/dL (ref 30.0–36.0)
MCV: 92.2 fl (ref 78.0–100.0)
MONOS PCT: 10.7 % (ref 3.0–12.0)
Monocytes Absolute: 0.7 10*3/uL (ref 0.1–1.0)
NEUTROS ABS: 3.6 10*3/uL (ref 1.4–7.7)
Neutrophils Relative %: 54.1 % (ref 43.0–77.0)
PLATELETS: 171 10*3/uL (ref 150.0–400.0)
RBC: 4.81 Mil/uL (ref 4.22–5.81)
RDW: 14.1 % (ref 11.5–15.5)
WBC: 6.7 10*3/uL (ref 4.0–10.5)

## 2015-05-13 LAB — HEPATIC FUNCTION PANEL
ALK PHOS: 45 U/L (ref 39–117)
ALT: 46 U/L (ref 0–53)
AST: 41 U/L — ABNORMAL HIGH (ref 0–37)
Albumin: 4.2 g/dL (ref 3.5–5.2)
BILIRUBIN DIRECT: 0.1 mg/dL (ref 0.0–0.3)
BILIRUBIN TOTAL: 0.5 mg/dL (ref 0.2–1.2)
TOTAL PROTEIN: 7.4 g/dL (ref 6.0–8.3)

## 2015-05-13 LAB — LIPID PANEL
CHOL/HDL RATIO: 2
Cholesterol: 159 mg/dL (ref 0–200)
HDL: 78.3 mg/dL (ref >39.00)
LDL CALC: 71 mg/dL (ref 0–99)
NONHDL: 80.42
TRIGLYCERIDES: 49 mg/dL (ref 0.0–149.0)
VLDL: 9.8 mg/dL (ref 0.0–40.0)

## 2015-05-13 LAB — BASIC METABOLIC PANEL
BUN: 18 mg/dL (ref 6–23)
CHLORIDE: 101 meq/L (ref 96–112)
CO2: 32 meq/L (ref 19–32)
CREATININE: 1.01 mg/dL (ref 0.40–1.50)
Calcium: 9.6 mg/dL (ref 8.4–10.5)
GFR: 77.47 mL/min (ref >60.00)
Glucose, Bld: 112 mg/dL — ABNORMAL HIGH (ref 70–99)
POTASSIUM: 4.6 meq/L (ref 3.5–5.1)
Sodium: 138 mEq/L (ref 135–145)

## 2015-05-13 LAB — HEMOGLOBIN A1C: HEMOGLOBIN A1C: 5.7 % (ref 4.6–6.5)

## 2015-05-14 ENCOUNTER — Other Ambulatory Visit: Payer: Self-pay | Admitting: Internal Medicine

## 2015-05-14 DIAGNOSIS — R7989 Other specified abnormal findings of blood chemistry: Secondary | ICD-10-CM

## 2015-05-14 DIAGNOSIS — R945 Abnormal results of liver function studies: Secondary | ICD-10-CM

## 2015-05-14 NOTE — Progress Notes (Signed)
Order placed for f/u liver panel.  

## 2015-05-15 ENCOUNTER — Encounter: Payer: Self-pay | Admitting: Internal Medicine

## 2015-05-28 ENCOUNTER — Encounter: Payer: Self-pay | Admitting: Internal Medicine

## 2015-05-28 ENCOUNTER — Other Ambulatory Visit (INDEPENDENT_AMBULATORY_CARE_PROVIDER_SITE_OTHER): Payer: Medicare Other

## 2015-05-28 DIAGNOSIS — R945 Abnormal results of liver function studies: Secondary | ICD-10-CM

## 2015-05-28 DIAGNOSIS — R7989 Other specified abnormal findings of blood chemistry: Secondary | ICD-10-CM

## 2015-05-28 LAB — HEPATIC FUNCTION PANEL
ALBUMIN: 4.4 g/dL (ref 3.5–5.2)
ALT: 31 U/L (ref 0–53)
AST: 29 U/L (ref 0–37)
Alkaline Phosphatase: 57 U/L (ref 39–117)
Bilirubin, Direct: 0.2 mg/dL (ref 0.0–0.3)
TOTAL PROTEIN: 7.2 g/dL (ref 6.0–8.3)
Total Bilirubin: 0.7 mg/dL (ref 0.2–1.2)

## 2015-05-28 NOTE — Progress Notes (Signed)
Patient here today for lab draw only. 

## 2015-06-04 ENCOUNTER — Emergency Department
Admission: EM | Admit: 2015-06-04 | Discharge: 2015-06-04 | Disposition: A | Discharge status: Home / Self Care | Payer: Medicare Other | Attending: Emergency Medicine | Admitting: Emergency Medicine

## 2015-06-04 ENCOUNTER — Telehealth: Payer: Self-pay | Admitting: Internal Medicine

## 2015-06-04 ENCOUNTER — Emergency Department: Payer: Medicare Other

## 2015-06-04 ENCOUNTER — Encounter: Payer: Self-pay | Admitting: Emergency Medicine

## 2015-06-04 DIAGNOSIS — E298 Other testicular dysfunction: Secondary | ICD-10-CM

## 2015-06-04 DIAGNOSIS — N50811 Right testicular pain: Secondary | ICD-10-CM | POA: Diagnosis present

## 2015-06-04 DIAGNOSIS — Z87891 Personal history of nicotine dependence: Secondary | ICD-10-CM | POA: Diagnosis not present

## 2015-06-04 DIAGNOSIS — I10 Essential (primary) hypertension: Secondary | ICD-10-CM | POA: Insufficient documentation

## 2015-06-04 DIAGNOSIS — N451 Epididymitis: Secondary | ICD-10-CM | POA: Insufficient documentation

## 2015-06-04 DIAGNOSIS — Z79899 Other long term (current) drug therapy: Secondary | ICD-10-CM | POA: Diagnosis not present

## 2015-06-04 DIAGNOSIS — N503 Cyst of epididymis: Secondary | ICD-10-CM | POA: Diagnosis not present

## 2015-06-04 DIAGNOSIS — N433 Hydrocele, unspecified: Secondary | ICD-10-CM | POA: Diagnosis not present

## 2015-06-04 DIAGNOSIS — Z7982 Long term (current) use of aspirin: Secondary | ICD-10-CM | POA: Diagnosis not present

## 2015-06-04 MED ORDER — LEVOFLOXACIN 500 MG PO TABS: 500.0000 mg | ORAL_TABLET | Freq: Every day | ORAL | Status: AC

## 2015-06-04 NOTE — Telephone Encounter (Signed)
Patient advised to go to ED,

## 2015-06-04 NOTE — ED Notes (Signed)
Pt reports right groin pain and right testicle pain/swelling since yesterday morning.

## 2015-06-04 NOTE — Discharge Instructions (Signed)
Please seek medical attention for any high fevers, chest pain, shortness of breath, change in behavior, persistent vomiting, bloody stool or any other new or concerning symptoms.   Epididymitis Epididymitis is swelling (inflammation) of the epididymis. The epididymis is a cord-like structure that is located along the top and back part of the testicle. It collects and stores sperm from the testicle. This condition can also cause pain and swelling of the testicle and scrotum. Symptoms usually start suddenly (acute epididymitis). Sometimes epididymitis starts gradually and lasts for a while (chronic epididymitis). This type may be harder to treat. CAUSES In men 61 and younger, this condition is usually caused by a bacterial infection or sexually transmitted disease (STD), such as:  Gonorrhea.  Chlamydia.  In men 84 and older who do not have anal sex, this condition is usually caused by bacteria from a blockage or abnormalities in the urinary system. These can result from:  Having a tube placed into the bladder (urinary catheter).  Having an enlarged or inflamed prostate gland.  Having recent urinary tract surgery. In men who have a condition that weakens the body's defense system (immune system), such as HIV, this condition can be caused by:   Other bacteria, including tuberculosis and syphilis.  Viruses.  Fungi. Sometimes this condition occurs without infection. That may happen if urine flows backward into the epididymis after heavy lifting or straining. RISK FACTORS This condition is more likely to develop in men:  Who have unprotected sex with more than one partner.  Who have anal sex.   Who have recently had surgery.   Who have a urinary catheter.  Who have urinary problems.  Who have a suppressed immune system. SYMPTOMS  This condition usually begins suddenly with chills, fever, and pain behind the scrotum and in the testicle. Other symptoms include:   Swelling of  the scrotum, testicle, or both.  Pain whenejaculatingor urinating.  Pain in the back or belly.  Nausea.  Itching and discharge from the penis.  Frequent need to pass urine.  Redness and tenderness of the scrotum. DIAGNOSIS Your health care provider can diagnose this condition based on your symptoms and medical history. Your health care provider will also do a physical exam to ask about your symptoms and check your scrotum and testicle for swelling, pain, and redness. You may also have other tests, including:   Examination of discharge from the penis.  Urine tests for infections, such as STDs.  Your health care provider may test you for other STDs, including HIV. TREATMENT Treatment for this condition depends on the cause. If your condition is caused by a bacterial infection, oral antibiotic medicine may be prescribed. If the bacterial infection has spread to your blood, you may need to receive IV antibiotics. Nonbacterial epididymitis is treated with home care that includes bed rest and elevation of the scrotum. Surgery may be needed to treat:  Bacterial epididymitis that causes pus to build up in the scrotum (abscess).  Chronic epididymitis that has not responded to other treatments. HOME CARE INSTRUCTIONS Medicines  Take over-the-counter and prescription medicines only as told by your health care provider.   If you were prescribed an antibiotic medicine, take it as told by your health care provider. Do not stop taking the antibiotic even if your condition improves. Sexual Activity  If your epididymitis was caused by an STD, avoid sexual activity until your treatment is complete.  Inform your sexual partner or partners if you test positive for an STD. They may  need to be treated.Do not engage in sexual activity with your partner or partners until their treatment is completed. General Instructions  Return to your normal activities as told by your health care  provider. Ask your health care provider what activities are safe for you.  Keep your scrotum elevated and supported while resting. Ask your health care provider if you should wear a scrotal support, such as a jockstrap. Wear it as told by your health care provider.  If directed, apply ice to the affected area:   Put ice in a plastic bag.  Place a towel between your skin and the bag.  Leave the ice on for 20 minutes, 2-3 times per day.  Try taking a sitz bath to help with discomfort. This is a warm water bath that is taken while you are sitting down. The water should only come up to your hips and should cover your buttocks. Do this 3-4 times per day or as told by your health care provider.  Keep all follow-up visits as told by your health care provider. This is important. SEEK MEDICAL CARE IF:   You have a fever.   Your pain medicine is not helping.   Your pain is getting worse.   Your symptoms do not improve within three days.   This information is not intended to replace advice given to you by your health care provider. Make sure you discuss any questions you have with your health care provider.   Document Released: 03/26/2000 Document Revised: 12/18/2014 Document Reviewed: 08/14/2014 Elsevier Interactive Patient Education [employer].

## 2015-06-04 NOTE — Telephone Encounter (Signed)
Patient Name: Nathan Watts DOB: 11/29/1945 Initial Comment Caller States he is having pain in the groin are and his right testicle is sensitive to the touch. started hurting yesterday. only hurts when he stands up. Nurse Assessment Nurse: Vallery Sa, RN, C[place] Date/Time (Eastern Time): 06/04/2015 12:25:26 PM Confirm and document reason for call. If symptomatic, describe symptoms. You must click the next button to save text entered. ---Caller states he developed right testicular and groin pain yesterday (rated as a 8/9 on the 1 to 10 scale). No injury in the past 3 days. No fever. No known hernia. Has the patient traveled out of the country within the last 30 days? ---No Does the patient have any new or worsening symptoms? ---Yes Will a triage be completed? ---Yes Related visit to physician within the last 2 weeks? ---Yes Does the PT have any chronic conditions? (i.e. diabetes, asthma, etc.) ---Yes List chronic conditions. 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 ---No Guidelines Guideline Title Affirmed Question Affirmed Notes Scrotal Pain SEVERE pain (e.g., excruciating) Final Disposition User Go to ED Now Vallery Sa, RN, [facility] - ED Disagree/Comply: Comply

## 2015-06-04 NOTE — ED Provider Notes (Signed)
[facility] Emergency Department Provider Note   ____________________________________________  Time seen: ~1615  I have reviewed the triage vital signs and the nursing notes.   HISTORY  Chief Complaint Testicle Pain   History limited by: Not Limited   HPI Nathan Watts is a 70 y.o. male who presents to the emergency department today because of concerns for testicular pain. He states it is the right testicle. Started yesterday. It continued this morning. He describes the pain as feeling like needles. He states he has some pain going up to his right groin. He states it was worse after walking around and standing. He has had some associated swelling. He denies any trauma to his testicle. He denies any fevers. Denies any nausea or vomiting.    Past Medical History  Diagnosis Date  . Hypercholesterolemia   . Thrombocytopenia ([place])   . Hyperglycemia   . Thyroid nodule   . GERD (gastroesophageal reflux disease)   . Anemia, unspecified   . Hypertension     Patient Active Problem List   Diagnosis Date Noted  . Aortic atherosclerosis ([place]) 03/13/2015  . Coronary artery disease 03/13/2015  . Smoking history 03/13/2015  . Chest tightness 03/05/2015  . Elevated blood pressure 03/05/2015  . Abdominal pain 01/06/2015  . Health care maintenance 10/09/2014  . Cellulitis of leg, left 12/14/2013  . Back pain 10/27/2013  . Neck pain 07/03/2013  . Neck fullness 02/18/2013  . Plantar fasciitis 12/31/2012  . Thyroid nodule 03/14/2012  . Hyperglycemia 03/14/2012  . Hypercholesterolemia 03/14/2012    Past Surgical History  Procedure Laterality Date  . Tonsillectomy    . Inguinal hernia repair    . Colonoscopy with propofol N/A 10/02/2014    Procedure: COLONOSCOPY WITH PROPOFOL;  Surgeon: Manya Silvas, MD;  Location: [facility] ENDOSCOPY;  Service: Endoscopy;  Laterality: N/A;    Current Outpatient Rx  Name  Route  Sig  Dispense  Refill  . aspirin 81 MG tablet   Oral   Take 81 mg by mouth daily.         Marland Kitchen atorvastatin (LIPITOR) 40 MG tablet      TAKE 1 BY MOUTH DAILY   90 tablet   3   . cyanocobalamin 500 MCG tablet   Oral   Take 500 mcg by mouth daily.         Marland Kitchen ezetimibe (ZETIA) 10 MG tablet   Oral   Take 1 tablet (10 mg total) by mouth daily.   30 tablet   6   . Lactobacillus (PROBIOTIC ACIDOPHILUS PO)   Oral   Take by mouth daily.         . Multiple Vitamin (MULTI VITAMIN MENS PO)   Oral   Take 1 tablet by mouth daily.         . Omega-3 Fatty Acids (FISH OIL) 1200 MG CAPS   Oral   Take 1 capsule by mouth daily.         . Omeprazole Magnesium (PRILOSEC OTC PO)   Oral   Take by mouth daily.         . tamsulosin (FLOMAX) 0.4 MG CAPS capsule   Oral   Take 0.4 mg by mouth daily.           Allergies Crestor  Family History  Problem Relation Age of Onset  . Prostate cancer Father   . Heart disease Father     s/p CABG  . Stroke Mother   . Colon cancer Neg  Hx     Social History Social History  Substance Use Topics  . Smoking status: Former Smoker    Quit date: 04/12/1998  . Smokeless tobacco: Never Used  . Alcohol Use: 0.0 oz/week    0 Standard drinks or equivalent per week     Comment: a few drinks a week    Review of Systems  Constitutional: Negative for fever. Cardiovascular: Negative for chest pain. Respiratory: Negative for shortness of breath. Gastrointestinal: Negative for abdominal pain, vomiting and diarrhea. Genitourinary: Negative for dysuria. Positive for right testicular swelling and pain Neurological: Negative for headaches, focal weakness or numbness.  10-point ROS otherwise negative.  ____________________________________________   PHYSICAL EXAM:  VITAL SIGNS: ED Triage Vitals  Enc Vitals Group     BP 06/04/15 1322 143/89 mmHg     Pulse --      Resp 06/04/15 1322 16     Temp 06/04/15 1322 98.1 F (36.7 C)     Temp Source 06/04/15 1322 Oral     SpO2 06/04/15 1322  99 %     Weight 06/04/15 1322 190 lb (86.183 kg)     Height 06/04/15 1322 5\' 11"  (1.803 m)     Head Cir --      Peak Flow --      Pain Score 06/04/15 1323 8   Constitutional: Alert and oriented. Well appearing and in no distress. Eyes: Conjunctivae are normal. PERRL. Normal extraocular movements. ENT   Head: Normocephalic and atraumatic.   Nose: No congestion/rhinnorhea.   Mouth/Throat: Mucous membranes are moist.   Neck: No stridor. Hematological/Lymphatic/Immunilogical: No cervical lymphadenopathy. Cardiovascular: Normal rate, regular rhythm.  No murmurs, rubs, or gallops. Respiratory: Normal respiratory effort without tachypnea nor retractions. Breath sounds are clear and equal bilaterally. No wheezes/rales/rhonchi. Gastrointestinal: Soft and nontender. No distention. There is no CVA tenderness. Genitourinary: Right testicular swelling. Tenderness to the superior part of the testicle. Musculoskeletal: Normal range of motion in all extremities. No joint effusions.  No lower extremity tenderness nor edema. Neurologic:  Normal speech and language. No gross focal neurologic deficits are appreciated.  Skin:  Skin is warm, dry and intact. No rash noted. Psychiatric: Mood and affect are normal. Speech and behavior are normal. Patient exhibits appropriate insight and judgment.  ____________________________________________    LABS (pertinent positives/negatives)  None  ____________________________________________   EKG  None  ____________________________________________    RADIOLOGY  Scrotal US  IMPRESSION: 1. Enlarged, heterogeneous, hypervascular right epididymis, compatible with epididymitis. 2. Diffusely heterogeneous left testicle. This could be due to infection or infiltrating neoplasm. 3. 3 mm left epididymal cyst. 4. Small right hydrocele.  ____________________________________________   PROCEDURES  Procedure(s) performed: None  Critical Care  performed: No  ____________________________________________   INITIAL IMPRESSION / ASSESSMENT AND PLAN / ED COURSE  Pertinent labs & imaging results that were available during my care of the patient were reviewed by me and considered in my medical decision making (see chart for details).  Patient presented to the emergency department today because of concerns for right testicular swelling. He did have some swelling and tenderness to the superior part of the testicle. Ultrasound is consistent with epididymitis. I do think this explains patient's symptoms. Patient denies being sexually active. No addition the left testicle had a heterogeneous appearance which I discussed with the patient. I did instruct patient to follow-up with Dr. Jacqlyn Larsen his urologist. Will place patient on Levaquin.  ____________________________________________   FINAL CLINICAL IMPRESSION(S) / ED DIAGNOSES  Final diagnoses:  Epididymitis  Nance Pear, MD 06/04/15 [phone]

## 2015-06-19 DIAGNOSIS — Z85828 Personal history of other malignant neoplasm of skin: Secondary | ICD-10-CM | POA: Diagnosis not present

## 2015-06-19 DIAGNOSIS — Z08 Encounter for follow-up examination after completed treatment for malignant neoplasm: Secondary | ICD-10-CM | POA: Diagnosis not present

## 2015-06-19 DIAGNOSIS — L718 Other rosacea: Secondary | ICD-10-CM | POA: Diagnosis not present

## 2015-07-07 ENCOUNTER — Encounter: Payer: Self-pay | Admitting: Internal Medicine

## 2015-07-07 ENCOUNTER — Ambulatory Visit (INDEPENDENT_AMBULATORY_CARE_PROVIDER_SITE_OTHER): Payer: Medicare Other | Admitting: Internal Medicine

## 2015-07-07 VITALS — BP 140/86 | HR 68 | Temp 98.1°F | Resp 18 | Ht 71.0 in | Wt 195.1 lb

## 2015-07-07 DIAGNOSIS — R972 Elevated prostate specific antigen [PSA]: Secondary | ICD-10-CM

## 2015-07-07 DIAGNOSIS — R739 Hyperglycemia, unspecified: Secondary | ICD-10-CM | POA: Diagnosis not present

## 2015-07-07 DIAGNOSIS — E7800 Pure hypercholesterolemia, unspecified: Secondary | ICD-10-CM

## 2015-07-07 DIAGNOSIS — I700 Atherosclerosis of aorta: Secondary | ICD-10-CM

## 2015-07-07 DIAGNOSIS — N50819 Testicular pain, unspecified: Secondary | ICD-10-CM

## 2015-07-07 DIAGNOSIS — I10 Essential (primary) hypertension: Secondary | ICD-10-CM

## 2015-07-07 MED ORDER — LISINOPRIL 10 MG PO TABS: 10.0000 mg | ORAL_TABLET | Freq: Every day | ORAL | Status: DC

## 2015-07-07 NOTE — Progress Notes (Signed)
Patient ID: Nathan Watts, male   DOB: 11-03-1945, 70 y.o.   MRN: [id number]   Subjective:    Patient ID: Nathan Watts, male    DOB: 1945-11-25, 70 y.o.   MRN: [id number]  HPI  Patient here for a scheduled follow up.  Recently saw Dr Jacqlyn Larsen for elevated psa.  Prostate biopsy negative.  On proscar now.  Has f/u with Dr Jacqlyn Larsen in the near future to reassess.  He was also recently diagnosed with epididymitis.  Treated with levaquin.  Saw Dr Jacqlyn Larsen in f/u.  No pain or problems now.  He is also being followed for renal cysts noted on recent CT abdomen and pelvis.  He is also being followed by vascular surgery for possible atherosclerotic aortic ulcer vs a saccular aneurysm.  Has f/u ultrasound planned in two weeks.  We are treating cholesterol.  Blood pressure has been elevated recently.  He has been monitoring.  Is exercising regularly.  No chest pain or tightness with increased activity or exertion.  No sob.  No abdominal pain or cramping.  Bowels stable.    Past Medical History  Diagnosis Date  . Hypercholesterolemia   . Thrombocytopenia ([place])   . Hyperglycemia   . Thyroid nodule   . GERD (gastroesophageal reflux disease)   . Anemia, unspecified   . Hypertension    Past Surgical History  Procedure Laterality Date  . Tonsillectomy    . Inguinal hernia repair    . Colonoscopy with propofol N/A 10/02/2014    Procedure: COLONOSCOPY WITH PROPOFOL;  Surgeon: Manya Silvas, MD;  Location: [facility] ENDOSCOPY;  Service: Endoscopy;  Laterality: N/A;   Family History  Problem Relation Age of Onset  . Prostate cancer Father   . Heart disease Father     s/p CABG  . Stroke Mother   . Colon cancer Neg Hx    Social History   Social History  . Marital Status: Married    Spouse Name: N/A  . Number of Children: N/A  . Years of Education: N/A   Social History Main Topics  . Smoking status: Former Smoker    Quit date: 04/12/1998  . Smokeless tobacco: Never Used  . Alcohol Use: 0.0 oz/week    0 Standard  drinks or equivalent per week     Comment: a few drinks a week  . Drug Use: No  . Sexual Activity: No   Other Topics Concern  . None   Social History Narrative   He is married. Has four children    Outpatient Encounter Prescriptions as of 07/07/2015  Medication Sig  . aspirin 81 MG tablet Take 81 mg by mouth daily.  Marland Kitchen atorvastatin (LIPITOR) 40 MG tablet TAKE 1 BY MOUTH DAILY  . cyanocobalamin 500 MCG tablet Take 500 mcg by mouth daily.  . finasteride (PROSCAR) 5 MG tablet   . Lactobacillus (PROBIOTIC ACIDOPHILUS PO) Take by mouth daily.  . Multiple Vitamin (MULTI VITAMIN MENS PO) Take 1 tablet by mouth daily.  . Omega-3 Fatty Acids (FISH OIL) 1200 MG CAPS Take 1 capsule by mouth daily.  . Omeprazole Magnesium (PRILOSEC OTC PO) Take by mouth daily.  . [DISCONTINUED] ezetimibe (ZETIA) 10 MG tablet Take 1 tablet (10 mg total) by mouth daily.  . [DISCONTINUED] tamsulosin (FLOMAX) 0.4 MG CAPS capsule Take 0.4 mg by mouth daily.  Marland Kitchen lisinopril (PRINIVIL,ZESTRIL) 10 MG tablet Take 1 tablet (10 mg total) by mouth daily.   No facility-administered encounter medications on file as of 07/07/2015.  Review of Systems  Constitutional: Negative for appetite change and unexpected weight change.  HENT: Negative for congestion and sinus pressure.   Respiratory: Negative for cough, chest tightness and shortness of breath.   Cardiovascular: Negative for chest pain, palpitations and leg swelling.  Gastrointestinal: Negative for nausea, vomiting, abdominal pain and diarrhea.  Genitourinary: Negative for dysuria and difficulty urinating.  Musculoskeletal: Negative for back pain and joint swelling.  Skin: Negative for color change and rash.  Neurological: Negative for dizziness, light-headedness and headaches.  Psychiatric/Behavioral: Negative for dysphoric mood and agitation.       Objective:     Blood pressure rechecked by me:  148/152/88  Physical Exam  Constitutional: He appears  well-developed and well-nourished. No distress.  HENT:  Nose: Nose normal.  Mouth/Throat: Oropharynx is clear and moist.  Neck: Neck supple. No thyromegaly present.  Cardiovascular: Normal rate and regular rhythm.   Pulmonary/Chest: Effort normal and breath sounds normal. No respiratory distress.  Abdominal: Soft. Bowel sounds are normal. There is no tenderness.  Musculoskeletal: He exhibits no edema or tenderness.  Lymphadenopathy:    He has no cervical adenopathy.  Skin: No rash noted. No erythema.  Psychiatric: He has a normal mood and affect. His behavior is normal.    BP 140/86 mmHg  Pulse 68  Temp(Src) 98.1 F (36.7 C) (Oral)  Resp 18  Ht '5\' 11"'  (1.803 m)  Wt 195 lb 2 oz (88.508 kg)  BMI 27.23 kg/m2  SpO2 97% Wt Readings from Last 3 Encounters:  07/07/15 195 lb 2 oz (88.508 kg)  06/04/15 190 lb (86.183 kg)  03/13/15 198 lb 12 oz (90.152 kg)     Lab Results  Component Value Date   WBC 6.7 05/13/2015   HGB 14.7 05/13/2015   HCT 44.4 05/13/2015   PLT 171.0 05/13/2015   GLUCOSE 112* 05/13/2015   CHOL 159 05/13/2015   TRIG 49.0 05/13/2015   HDL 78.30 05/13/2015   LDLCALC 71 05/13/2015   ALT 31 05/28/2015   AST 29 05/28/2015   NA 138 05/13/2015   K 4.6 05/13/2015   CL 101 05/13/2015   CREATININE 1.01 05/13/2015   BUN 18 05/13/2015   CO2 32 05/13/2015   TSH 1.60 01/06/2015   PSA 4.44* 01/06/2015   HGBA1C 5.7 05/13/2015    US Scrotum  06/04/2015  CLINICAL DATA:  Right scrotal pain for 1 day. Right scrotal swelling. EXAM: ULTRASOUND OF SCROTUM TECHNIQUE: Complete ultrasound examination of the testicles, epididymis, and other scrotal structures was performed. COMPARISON:  None. FINDINGS: Right testicle Measurements: 4.3 x 3.2 x 2.6 cm. No mass or microlithiasis visualized. Left testicle Measurements: 3.5 x 2.6 x 1.9 cm. Diffusely heterogeneous. No discrete mass seen. Right epididymis: Diffusely enlarged and heterogeneous. Increased blood flow with color Doppler.  Left epididymis: 3 mm cyst in the head of the epididymis. Otherwise, normal. Hydrocele:  Small right hydrocele. Varicocele:  None visualized. IMPRESSION: 1. Enlarged, heterogeneous, hypervascular right epididymis, compatible with epididymitis. 2. Diffusely heterogeneous left testicle. This could be due to infection or infiltrating neoplasm. 3. 3 mm left epididymal cyst. 4. Small right hydrocele. Electronically Signed   By: Claudie Revering M.D.   On: 06/04/2015 16:19   [place] Art/ven Flow Abd Pelv Doppler  06/04/2015  CLINICAL DATA:  Right scrotal pain for 1 day. Right scrotal swelling. EXAM: ULTRASOUND OF SCROTUM TECHNIQUE: Complete ultrasound examination of the testicles, epididymis, and other scrotal structures was performed. COMPARISON:  None. FINDINGS: Right testicle Measurements: 4.3 x 3.2 x 2.6 cm.  No mass or microlithiasis visualized. Left testicle Measurements: 3.5 x 2.6 x 1.9 cm. Diffusely heterogeneous. No discrete mass seen. Right epididymis: Diffusely enlarged and heterogeneous. Increased blood flow with color Doppler. Left epididymis: 3 mm cyst in the head of the epididymis. Otherwise, normal. Hydrocele:  Small right hydrocele. Varicocele:  None visualized. IMPRESSION: 1. Enlarged, heterogeneous, hypervascular right epididymis, compatible with epididymitis. 2. Diffusely heterogeneous left testicle. This could be due to infection or infiltrating neoplasm. 3. 3 mm left epididymal cyst. 4. Small right hydrocele. Electronically Signed   By: Claudie Revering M.D.   On: 06/04/2015 16:19       Assessment & Plan:   Problem List Items Addressed This Visit    Aortic atherosclerosis ([place])    Significant aortic atherosclerosis seen on CT scan.  Region of aortic ulceration as outlined.  Continue treatment of his cholesterol.  Start blood pressure medication for better control.  Being followed at [facility].  Schedule for f/u ultrasound in two weeks.        Relevant Medications   lisinopril (PRINIVIL,ZESTRIL) 10 MG  tablet   Elevated PSA    Negative prostate biopsy.  On proscar.  Being followed by Dr Jacqlyn Larsen.       Hypercholesterolemia    On lipitor.  Follow lipid panel and liver function tests.  Diet and exercise.        Relevant Medications   lisinopril (PRINIVIL,ZESTRIL) 10 MG tablet   Hyperglycemia    Low carb diet and exercise.  Follow met b and a1c.   Lab Results  Component Value Date   HGBA1C 5.7 05/13/2015        Hypertension, essential - Primary    Blood pressure elevated.  Start lisinopril 37m q day.  Follow pressures.  Check metabolic panel in two weeks.  Get him back in soon to reassess.       Relevant Medications   lisinopril (PRINIVIL,ZESTRIL) 10 MG tablet   Other Relevant Orders   Basic metabolic panel   Testicular pain    Ultrasound as outlined.  Saw Dr CJacqlyn Larsen  No pain now.  Felt no further w/up warranted per pt.  Follow.           SEinar Pheasant MD

## 2015-07-07 NOTE — Progress Notes (Signed)
Pre-visit discussion using our clinic review tool. No additional management support is needed unless otherwise documented below in the visit note.  

## 2015-07-14 ENCOUNTER — Encounter: Payer: Self-pay | Admitting: Internal Medicine

## 2015-07-14 DIAGNOSIS — R972 Elevated prostate specific antigen [PSA]: Secondary | ICD-10-CM | POA: Insufficient documentation

## 2015-07-14 DIAGNOSIS — N50819 Testicular pain, unspecified: Secondary | ICD-10-CM | POA: Insufficient documentation

## 2015-07-14 NOTE — Assessment & Plan Note (Signed)
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  Region of aortic ulceration as outlined.  Continue treatment of his cholesterol.  Start blood pressure medication for better control.  Being followed at [facility].  Schedule for f/u ultrasound in two weeks.

## 2015-07-14 NOTE — Assessment & Plan Note (Signed)
Blood pressure elevated.  Start lisinopril 10mg  q day.  Follow pressures.  Check metabolic panel in two weeks.  Get him back in soon to reassess.

## 2015-07-14 NOTE — Assessment & Plan Note (Signed)
On lipitor.  Follow lipid panel and liver function tests.  Diet and exercise.

## 2015-07-14 NOTE — Assessment & Plan Note (Signed)
Ultrasound as outlined.  Saw Dr Jacqlyn Larsen.  No pain now.  Felt no further w/up warranted per pt.  Follow.

## 2015-07-14 NOTE — Assessment & Plan Note (Signed)
Low carb diet and exercise.  Follow met b and a1c.   Lab Results  Component Value Date   HGBA1C 5.7 05/13/2015

## 2015-07-14 NOTE — Assessment & Plan Note (Signed)
Negative prostate biopsy.  On proscar.  Being followed by Dr Jacqlyn Larsen.

## 2015-07-21 ENCOUNTER — Other Ambulatory Visit (INDEPENDENT_AMBULATORY_CARE_PROVIDER_SITE_OTHER): Payer: Medicare Other

## 2015-07-21 DIAGNOSIS — I10 Essential (primary) hypertension: Secondary | ICD-10-CM | POA: Diagnosis not present

## 2015-07-21 LAB — BASIC METABOLIC PANEL
BUN: 15 mg/dL (ref 6–23)
CALCIUM: 9.3 mg/dL (ref 8.4–10.5)
CO2: 28 mEq/L (ref 19–32)
Chloride: 101 mEq/L (ref 96–112)
Creatinine, Ser: 0.88 mg/dL (ref 0.40–1.50)
GFR: 90.77 mL/min (ref >60.00)
GLUCOSE: 97 mg/dL (ref 70–99)
POTASSIUM: 4 meq/L (ref 3.5–5.1)
SODIUM: 137 meq/L (ref 135–145)

## 2015-07-22 ENCOUNTER — Encounter: Payer: Self-pay | Admitting: Internal Medicine

## 2015-08-01 ENCOUNTER — Encounter: Payer: Self-pay | Admitting: Internal Medicine

## 2015-08-01 DIAGNOSIS — I10 Essential (primary) hypertension: Secondary | ICD-10-CM | POA: Diagnosis not present

## 2015-08-01 DIAGNOSIS — I700 Atherosclerosis of aorta: Secondary | ICD-10-CM | POA: Diagnosis not present

## 2015-08-01 DIAGNOSIS — E785 Hyperlipidemia, unspecified: Secondary | ICD-10-CM | POA: Diagnosis not present

## 2015-08-01 DIAGNOSIS — I739 Peripheral vascular disease, unspecified: Secondary | ICD-10-CM | POA: Diagnosis not present

## 2015-08-02 ENCOUNTER — Other Ambulatory Visit: Payer: Self-pay | Admitting: Internal Medicine

## 2015-08-06 DIAGNOSIS — N411 Chronic prostatitis: Secondary | ICD-10-CM | POA: Diagnosis not present

## 2015-08-06 DIAGNOSIS — R972 Elevated prostate specific antigen [PSA]: Secondary | ICD-10-CM | POA: Diagnosis not present

## 2015-08-06 DIAGNOSIS — R339 Retention of urine, unspecified: Secondary | ICD-10-CM | POA: Diagnosis not present

## 2015-08-06 DIAGNOSIS — N138 Other obstructive and reflux uropathy: Secondary | ICD-10-CM | POA: Diagnosis not present

## 2015-08-06 DIAGNOSIS — N401 Enlarged prostate with lower urinary tract symptoms: Secondary | ICD-10-CM | POA: Diagnosis not present

## 2015-08-27 ENCOUNTER — Encounter: Payer: Self-pay | Admitting: Internal Medicine

## 2015-08-27 ENCOUNTER — Other Ambulatory Visit: Payer: Self-pay | Admitting: *Deleted

## 2015-08-27 DIAGNOSIS — H2513 Age-related nuclear cataract, bilateral: Secondary | ICD-10-CM | POA: Diagnosis not present

## 2015-08-27 MED ORDER — LISINOPRIL 10 MG PO TABS: 10.0000 mg | ORAL_TABLET | Freq: Every day | ORAL | Status: DC

## 2015-08-27 NOTE — Telephone Encounter (Signed)
Rx refilled for 90 day supply. 

## 2015-08-27 NOTE — Telephone Encounter (Signed)
rx sent if for lisinopril #90 with one refills.  Pt notified via my chart.  Blood pressures reviewed.

## 2015-09-19 ENCOUNTER — Ambulatory Visit: Payer: Medicare Other | Admitting: Internal Medicine

## 2015-10-16 ENCOUNTER — Encounter: Payer: Self-pay | Admitting: Internal Medicine

## 2015-10-16 ENCOUNTER — Ambulatory Visit (INDEPENDENT_AMBULATORY_CARE_PROVIDER_SITE_OTHER): Payer: Medicare Other | Admitting: Internal Medicine

## 2015-10-16 VITALS — BP 120/80 | HR 58 | Temp 98.4°F | Resp 18 | Ht 71.0 in | Wt 202.1 lb

## 2015-10-16 DIAGNOSIS — I10 Essential (primary) hypertension: Secondary | ICD-10-CM

## 2015-10-16 DIAGNOSIS — E041 Nontoxic single thyroid nodule: Secondary | ICD-10-CM

## 2015-10-16 DIAGNOSIS — R739 Hyperglycemia, unspecified: Secondary | ICD-10-CM | POA: Diagnosis not present

## 2015-10-16 DIAGNOSIS — I2510 Atherosclerotic heart disease of native coronary artery without angina pectoris: Secondary | ICD-10-CM

## 2015-10-16 DIAGNOSIS — E7800 Pure hypercholesterolemia, unspecified: Secondary | ICD-10-CM

## 2015-10-16 DIAGNOSIS — I700 Atherosclerosis of aorta: Secondary | ICD-10-CM

## 2015-10-16 NOTE — Progress Notes (Signed)
Pre-visit discussion using our clinic review tool. No additional management support is needed unless otherwise documented below in the visit note.  

## 2015-10-16 NOTE — Progress Notes (Signed)
Patient ID: Nathan Watts, male   DOB: Dec 02, 1945, 70 y.o.   MRN: [id number]   Subjective:    Patient ID: Nathan Watts, male    DOB: 07/22/1945, 70 y.o.   MRN: [id number]  HPI  Patient here for a scheduled follow up.  States he is doing well.  Is walking.  No chest pain or tightness with increased activity or exertion.  No sob.  No acid reflux.  No abdominal pain or cramping.  Bowels stable.  Seeing Dr Jacqlyn Larsen.  Stable.    Past Medical History  Diagnosis Date  . Hypercholesterolemia   . Thrombocytopenia ([place])   . Hyperglycemia   . Thyroid nodule   . GERD (gastroesophageal reflux disease)   . Anemia, unspecified   . Hypertension    Past Surgical History  Procedure Laterality Date  . Tonsillectomy    . Inguinal hernia repair    . Colonoscopy with propofol N/A 10/02/2014    Procedure: COLONOSCOPY WITH PROPOFOL;  Surgeon: Manya Silvas, MD;  Location: [facility] ENDOSCOPY;  Service: Endoscopy;  Laterality: N/A;   Family History  Problem Relation Age of Onset  . Prostate cancer Father   . Heart disease Father     s/p CABG  . Stroke Mother   . Colon cancer Neg Hx    Social History   Social History  . Marital Status: Married    Spouse Name: N/A  . Number of Children: N/A  . Years of Education: N/A   Social History Main Topics  . Smoking status: Former Smoker    Quit date: 04/12/1998  . Smokeless tobacco: Never Used  . Alcohol Use: 0.0 oz/week    0 Standard drinks or equivalent per week     Comment: a few drinks a week  . Drug Use: No  . Sexual Activity: No   Other Topics Concern  . None   Social History Narrative   He is married. Has four children    Outpatient Encounter Prescriptions as of 10/16/2015  Medication Sig  . aspirin 81 MG tablet Take 81 mg by mouth daily.  Marland Kitchen atorvastatin (LIPITOR) 40 MG tablet TAKE 1 BY MOUTH DAILY  . cyanocobalamin 500 MCG tablet Take 500 mcg by mouth daily.  . finasteride (PROSCAR) 5 MG tablet   . Lactobacillus (PROBIOTIC ACIDOPHILUS PO)  Take by mouth daily.  Marland Kitchen lisinopril (PRINIVIL,ZESTRIL) 10 MG tablet Take 1 tablet (10 mg total) by mouth daily.  . Multiple Vitamin (MULTI VITAMIN MENS PO) Take 1 tablet by mouth daily.  . Omega-3 Fatty Acids (FISH OIL) 1200 MG CAPS Take 1 capsule by mouth daily.  . Omeprazole Magnesium (PRILOSEC OTC PO) Take by mouth daily.   No facility-administered encounter medications on file as of 10/16/2015.    Review of Systems  Constitutional: Negative for appetite change and unexpected weight change.  HENT: Negative for congestion and sinus pressure.   Respiratory: Negative for cough, chest tightness and shortness of breath.   Cardiovascular: Negative for chest pain, palpitations and leg swelling.  Gastrointestinal: Negative for nausea, vomiting, abdominal pain and diarrhea.  Genitourinary: Negative for dysuria and difficulty urinating.  Musculoskeletal: Negative for back pain and joint swelling.  Skin: Negative for color change and rash.  Neurological: Negative for dizziness, light-headedness and headaches.  Psychiatric/Behavioral: Negative for dysphoric mood and agitation.       Objective:    Physical Exam  Constitutional: He appears well-developed and well-nourished. No distress.  HENT:  Nose: Nose normal.  Mouth/Throat: Oropharynx is clear and  moist.  Neck: Neck supple.  Cardiovascular: Normal rate and regular rhythm.   Pulmonary/Chest: Effort normal and breath sounds normal. No respiratory distress.  Abdominal: Soft. Bowel sounds are normal. There is no tenderness.  Musculoskeletal: He exhibits no edema or tenderness.  Lymphadenopathy:    He has no cervical adenopathy.  Skin: Skin is warm and dry. No rash noted. No erythema.  Psychiatric: He has a normal mood and affect. His behavior is normal.    BP 120/80 mmHg  Pulse 58  Temp(Src) 98.4 F (36.9 C) (Oral)  Resp 18  Ht '5\' 11"'  (1.803 m)  Wt 202 lb 2 oz (91.683 kg)  BMI 28.20 kg/m2  SpO2 97% Wt Readings from Last 3  Encounters:  10/16/15 202 lb 2 oz (91.683 kg)  07/07/15 195 lb 2 oz (88.508 kg)  06/04/15 190 lb (86.183 kg)     Lab Results  Component Value Date   WBC 6.7 05/13/2015   HGB 14.7 05/13/2015   HCT 44.4 05/13/2015   PLT 171.0 05/13/2015   GLUCOSE 97 07/21/2015   CHOL 159 05/13/2015   TRIG 49.0 05/13/2015   HDL 78.30 05/13/2015   LDLCALC 71 05/13/2015   ALT 31 05/28/2015   AST 29 05/28/2015   NA 137 07/21/2015   K 4.0 07/21/2015   CL 101 07/21/2015   CREATININE 0.88 07/21/2015   BUN 15 07/21/2015   CO2 28 07/21/2015   TSH 1.60 01/06/2015   PSA 4.44* 01/06/2015   HGBA1C 5.7 05/13/2015    US Scrotum  06/04/2015  CLINICAL DATA:  Right scrotal pain for 1 day. Right scrotal swelling. EXAM: ULTRASOUND OF SCROTUM TECHNIQUE: Complete ultrasound examination of the testicles, epididymis, and other scrotal structures was performed. COMPARISON:  None. FINDINGS: Right testicle Measurements: 4.3 x 3.2 x 2.6 cm. No mass or microlithiasis visualized. Left testicle Measurements: 3.5 x 2.6 x 1.9 cm. Diffusely heterogeneous. No discrete mass seen. Right epididymis: Diffusely enlarged and heterogeneous. Increased blood flow with color Doppler. Left epididymis: 3 mm cyst in the head of the epididymis. Otherwise, normal. Hydrocele:  Small right hydrocele. Varicocele:  None visualized. IMPRESSION: 1. Enlarged, heterogeneous, hypervascular right epididymis, compatible with epididymitis. 2. Diffusely heterogeneous left testicle. This could be due to infection or infiltrating neoplasm. 3. 3 mm left epididymal cyst. 4. Small right hydrocele. Electronically Signed   By: Claudie Revering M.D.   On: 06/04/2015 16:19   [place] Art/ven Flow Abd Pelv Doppler  06/04/2015  CLINICAL DATA:  Right scrotal pain for 1 day. Right scrotal swelling. EXAM: ULTRASOUND OF SCROTUM TECHNIQUE: Complete ultrasound examination of the testicles, epididymis, and other scrotal structures was performed. COMPARISON:  None. FINDINGS: Right testicle  Measurements: 4.3 x 3.2 x 2.6 cm. No mass or microlithiasis visualized. Left testicle Measurements: 3.5 x 2.6 x 1.9 cm. Diffusely heterogeneous. No discrete mass seen. Right epididymis: Diffusely enlarged and heterogeneous. Increased blood flow with color Doppler. Left epididymis: 3 mm cyst in the head of the epididymis. Otherwise, normal. Hydrocele:  Small right hydrocele. Varicocele:  None visualized. IMPRESSION: 1. Enlarged, heterogeneous, hypervascular right epididymis, compatible with epididymitis. 2. Diffusely heterogeneous left testicle. This could be due to infection or infiltrating neoplasm. 3. 3 mm left epididymal cyst. 4. Small right hydrocele. Electronically Signed   By: Claudie Revering M.D.   On: 06/04/2015 16:19       Assessment & Plan:   Problem List Items Addressed This Visit    Aortic atherosclerosis ([place])    Significant aortic atherosclerosis seen on CT.  Region of aortic ulceration as outlined.  Continue treating cholesterol.  Being followed at [facility].  On lisinopril.        Coronary artery disease    Some underlying coronary artery disease - plaque in the distal RCA.  Asymptomatic.  Walking.  Low cholesterol diet and exercise.  Continue lipitor.        Hypercholesterolemia    On lipitor.  Low cholesterol diet and exercise.  Follow lipid panel and liver function tests.        Relevant Orders   Lipid panel   Hepatic function panel   Hyperglycemia    Low carb diet and exercise.  Follow met b and a1c.       Relevant Orders   Hemoglobin R3U   Basic metabolic panel   Hypertension, essential    On lisinopril.  Blood pressure on outside checks averaging mostly 110-130s/70-80s.  Continue current medication regimen.  Follow metabolic panel.        Thyroid nodule - Primary    Previous biopsy negative.  Seeing Dr Eddie Dibbles.  Recommended f/u appt in two years.  Called their office appt scheduled for 01/01/16 at 1:45.        Relevant Orders   TSH       Einar Pheasant, MD

## 2015-10-19 ENCOUNTER — Encounter: Payer: Self-pay | Admitting: Internal Medicine

## 2015-10-19 ENCOUNTER — Telehealth: Payer: Self-pay | Admitting: Internal Medicine

## 2015-10-19 NOTE — Assessment & Plan Note (Signed)
On lipitor.  Low cholesterol diet and exercise.  Follow lipid panel and liver function tests.   

## 2015-10-19 NOTE — Telephone Encounter (Signed)
I called endocrinology about f/u appt with Dr Eddie Dibbles.  They scheduled f/u appt 01/01/16 at 1:45.  Pt notified via my chart.

## 2015-10-19 NOTE — Assessment & Plan Note (Signed)
On lisinopril.  Blood pressure on outside checks averaging mostly 110-130s/70-80s.  Continue current medication regimen.  Follow metabolic panel.

## 2015-10-19 NOTE — Assessment & Plan Note (Signed)
Previous biopsy negative.  Seeing Dr Eddie Dibbles.  Recommended f/u appt in two years.  Called their office appt scheduled for 01/01/16 at 1:45.

## 2015-10-19 NOTE — Assessment & Plan Note (Signed)
Significant aortic atherosclerosis seen on CT.  Region of aortic ulceration as outlined.  Continue treating cholesterol.  Being followed at [facility].  On lisinopril.

## 2015-10-19 NOTE — Assessment & Plan Note (Signed)
Low carb diet and exercise.  Follow met b and a1c.  

## 2015-10-19 NOTE — Assessment & Plan Note (Signed)
Some underlying coronary artery disease - plaque in the distal RCA.  Asymptomatic.  Walking.  Low cholesterol diet and exercise.  Continue lipitor.

## 2015-10-22 NOTE — Telephone Encounter (Signed)
Unread mychart message mailed to patient 

## 2015-11-14 ENCOUNTER — Other Ambulatory Visit (INDEPENDENT_AMBULATORY_CARE_PROVIDER_SITE_OTHER): Payer: Medicare Other

## 2015-11-14 ENCOUNTER — Other Ambulatory Visit: Payer: Self-pay | Admitting: Internal Medicine

## 2015-11-14 DIAGNOSIS — E7800 Pure hypercholesterolemia, unspecified: Secondary | ICD-10-CM | POA: Diagnosis not present

## 2015-11-14 DIAGNOSIS — R7989 Other specified abnormal findings of blood chemistry: Secondary | ICD-10-CM

## 2015-11-14 DIAGNOSIS — R945 Abnormal results of liver function studies: Secondary | ICD-10-CM

## 2015-11-14 DIAGNOSIS — E041 Nontoxic single thyroid nodule: Secondary | ICD-10-CM | POA: Diagnosis not present

## 2015-11-14 DIAGNOSIS — R739 Hyperglycemia, unspecified: Secondary | ICD-10-CM

## 2015-11-14 LAB — BASIC METABOLIC PANEL
BUN: 14 mg/dL (ref 6–23)
CHLORIDE: 102 meq/L (ref 96–112)
CO2: 31 mEq/L (ref 19–32)
Calcium: 9.8 mg/dL (ref 8.4–10.5)
Creatinine, Ser: 0.99 mg/dL (ref 0.40–1.50)
GFR: 79.16 mL/min (ref >60.00)
Glucose, Bld: 100 mg/dL — ABNORMAL HIGH (ref 70–99)
POTASSIUM: 4.2 meq/L (ref 3.5–5.1)
Sodium: 141 mEq/L (ref 135–145)

## 2015-11-14 LAB — LIPID PANEL
CHOL/HDL RATIO: 2
Cholesterol: 190 mg/dL (ref 0–200)
HDL: 110.4 mg/dL (ref >39.00)
LDL Cholesterol: 69 mg/dL (ref 0–99)
NONHDL: 79.44
Triglycerides: 52 mg/dL (ref 0.0–149.0)
VLDL: 10.4 mg/dL (ref 0.0–40.0)

## 2015-11-14 LAB — HEPATIC FUNCTION PANEL
ALBUMIN: 4.5 g/dL (ref 3.5–5.2)
ALK PHOS: 50 U/L (ref 39–117)
ALT: 47 U/L (ref 0–53)
AST: 51 U/L — ABNORMAL HIGH (ref 0–37)
Bilirubin, Direct: 0.2 mg/dL (ref 0.0–0.3)
Total Bilirubin: 1.1 mg/dL (ref 0.2–1.2)
Total Protein: 7.7 g/dL (ref 6.0–8.3)

## 2015-11-14 LAB — TSH: TSH: 1.26 u[IU]/mL (ref 0.35–4.50)

## 2015-11-14 LAB — HEMOGLOBIN A1C: HEMOGLOBIN A1C: 5.7 % (ref 4.6–6.5)

## 2015-11-28 ENCOUNTER — Other Ambulatory Visit: Payer: Medicare Other

## 2015-12-01 ENCOUNTER — Other Ambulatory Visit (INDEPENDENT_AMBULATORY_CARE_PROVIDER_SITE_OTHER): Payer: Medicare Other

## 2015-12-01 DIAGNOSIS — R7989 Other specified abnormal findings of blood chemistry: Secondary | ICD-10-CM

## 2015-12-01 DIAGNOSIS — R945 Abnormal results of liver function studies: Secondary | ICD-10-CM

## 2015-12-01 LAB — HEPATIC FUNCTION PANEL
ALBUMIN: 4.5 g/dL (ref 3.5–5.2)
ALK PHOS: 49 U/L (ref 39–117)
ALT: 64 U/L — AB (ref 0–53)
AST: 66 U/L — AB (ref 0–37)
Bilirubin, Direct: 0.2 mg/dL (ref 0.0–0.3)
Total Bilirubin: 0.9 mg/dL (ref 0.2–1.2)
Total Protein: 7.5 g/dL (ref 6.0–8.3)

## 2015-12-02 DIAGNOSIS — D485 Neoplasm of uncertain behavior of skin: Secondary | ICD-10-CM | POA: Diagnosis not present

## 2015-12-02 DIAGNOSIS — B079 Viral wart, unspecified: Secondary | ICD-10-CM | POA: Diagnosis not present

## 2015-12-04 ENCOUNTER — Other Ambulatory Visit: Payer: Self-pay | Admitting: Internal Medicine

## 2015-12-04 DIAGNOSIS — R945 Abnormal results of liver function studies: Secondary | ICD-10-CM

## 2015-12-04 DIAGNOSIS — R7989 Other specified abnormal findings of blood chemistry: Secondary | ICD-10-CM

## 2015-12-10 ENCOUNTER — Other Ambulatory Visit (INDEPENDENT_AMBULATORY_CARE_PROVIDER_SITE_OTHER): Payer: Medicare Other

## 2015-12-10 DIAGNOSIS — R7989 Other specified abnormal findings of blood chemistry: Secondary | ICD-10-CM | POA: Diagnosis not present

## 2015-12-10 DIAGNOSIS — R945 Abnormal results of liver function studies: Secondary | ICD-10-CM

## 2015-12-10 LAB — HEPATIC FUNCTION PANEL
ALK PHOS: 49 U/L (ref 39–117)
ALT: 78 U/L — ABNORMAL HIGH (ref 0–53)
AST: 62 U/L — ABNORMAL HIGH (ref 0–37)
Albumin: 4.4 g/dL (ref 3.5–5.2)
BILIRUBIN DIRECT: 0.1 mg/dL (ref 0.0–0.3)
BILIRUBIN TOTAL: 0.7 mg/dL (ref 0.2–1.2)
TOTAL PROTEIN: 7.5 g/dL (ref 6.0–8.3)

## 2015-12-11 ENCOUNTER — Encounter: Payer: Self-pay | Admitting: Internal Medicine

## 2015-12-11 DIAGNOSIS — N138 Other obstructive and reflux uropathy: Secondary | ICD-10-CM | POA: Diagnosis not present

## 2015-12-11 DIAGNOSIS — N411 Chronic prostatitis: Secondary | ICD-10-CM | POA: Diagnosis not present

## 2015-12-11 DIAGNOSIS — R972 Elevated prostate specific antigen [PSA]: Secondary | ICD-10-CM | POA: Diagnosis not present

## 2015-12-11 DIAGNOSIS — N401 Enlarged prostate with lower urinary tract symptoms: Secondary | ICD-10-CM | POA: Diagnosis not present

## 2015-12-11 DIAGNOSIS — R339 Retention of urine, unspecified: Secondary | ICD-10-CM | POA: Diagnosis not present

## 2015-12-11 LAB — HEPATITIS B SURFACE ANTIGEN: HEP B S AG: NEGATIVE

## 2015-12-11 LAB — HEPATITIS C ANTIBODY: HCV AB: NEGATIVE

## 2015-12-11 LAB — HEPATITIS B SURFACE ANTIBODY,QUALITATIVE: Hep B S Ab: NEGATIVE

## 2015-12-11 LAB — HEPATITIS B CORE ANTIBODY, TOTAL: Hep B Core Total Ab: NONREACTIVE

## 2015-12-11 LAB — HEPATITIS B CORE ANTIBODY, IGM: Hep B C IgM: NONREACTIVE

## 2015-12-16 LAB — HEPATITIS B E ANTIGEN: Hepatitis Be Antigen: NONREACTIVE

## 2015-12-19 ENCOUNTER — Other Ambulatory Visit: Payer: Self-pay | Admitting: Internal Medicine

## 2015-12-19 DIAGNOSIS — R945 Abnormal results of liver function studies: Secondary | ICD-10-CM

## 2015-12-19 DIAGNOSIS — R7989 Other specified abnormal findings of blood chemistry: Secondary | ICD-10-CM

## 2015-12-22 DIAGNOSIS — B078 Other viral warts: Secondary | ICD-10-CM | POA: Diagnosis not present

## 2016-01-01 DIAGNOSIS — E041 Nontoxic single thyroid nodule: Secondary | ICD-10-CM | POA: Diagnosis not present

## 2016-01-08 DIAGNOSIS — R748 Abnormal levels of other serum enzymes: Secondary | ICD-10-CM | POA: Diagnosis not present

## 2016-01-08 DIAGNOSIS — K760 Fatty (change of) liver, not elsewhere classified: Secondary | ICD-10-CM | POA: Diagnosis not present

## 2016-01-09 DIAGNOSIS — R748 Abnormal levels of other serum enzymes: Secondary | ICD-10-CM | POA: Diagnosis not present

## 2016-01-09 DIAGNOSIS — K760 Fatty (change of) liver, not elsewhere classified: Secondary | ICD-10-CM | POA: Diagnosis not present

## 2016-01-14 ENCOUNTER — Other Ambulatory Visit: Payer: Self-pay | Admitting: Nurse Practitioner

## 2016-01-14 DIAGNOSIS — K760 Fatty (change of) liver, not elsewhere classified: Secondary | ICD-10-CM

## 2016-01-20 ENCOUNTER — Ambulatory Visit
Admission: RE | Admit: 2016-01-20 | Discharge: 2016-01-20 | Disposition: A | Discharge status: Home / Self Care | Payer: Medicare Other | Source: Ambulatory Visit | Attending: Nurse Practitioner | Admitting: Nurse Practitioner

## 2016-01-20 DIAGNOSIS — K760 Fatty (change of) liver, not elsewhere classified: Secondary | ICD-10-CM | POA: Diagnosis present

## 2016-01-20 DIAGNOSIS — R7989 Other specified abnormal findings of blood chemistry: Secondary | ICD-10-CM | POA: Diagnosis not present

## 2016-01-20 DIAGNOSIS — R932 Abnormal findings on diagnostic imaging of liver and biliary tract: Secondary | ICD-10-CM | POA: Insufficient documentation

## 2016-01-21 ENCOUNTER — Telehealth: Payer: Self-pay | Admitting: Internal Medicine

## 2016-01-21 NOTE — Telephone Encounter (Signed)
Please let him know that since [facility] ordered, they will notify and we will get results from them.  Any problems, let me know.

## 2016-01-21 NOTE — Telephone Encounter (Signed)
Spoke with patient , it was a call for a reminder for upcoming appt. thanks

## 2016-01-21 NOTE — Telephone Encounter (Signed)
Patient had a US done yesterday, but it wasn't ordered by you, please advise if you are willing to give results?

## 2016-01-21 NOTE — Telephone Encounter (Signed)
Pt called regarding u/s results. Thank you!  Call pt @ [phone]

## 2016-01-22 ENCOUNTER — Other Ambulatory Visit: Payer: Self-pay | Admitting: Nurse Practitioner

## 2016-01-22 DIAGNOSIS — R748 Abnormal levels of other serum enzymes: Secondary | ICD-10-CM

## 2016-01-22 DIAGNOSIS — K760 Fatty (change of) liver, not elsewhere classified: Secondary | ICD-10-CM

## 2016-01-24 ENCOUNTER — Ambulatory Visit (INDEPENDENT_AMBULATORY_CARE_PROVIDER_SITE_OTHER): Payer: Medicare Other

## 2016-01-24 DIAGNOSIS — Z23 Encounter for immunization: Secondary | ICD-10-CM | POA: Diagnosis not present

## 2016-01-26 ENCOUNTER — Ambulatory Visit
Admission: RE | Admit: 2016-01-26 | Discharge: 2016-01-26 | Disposition: A | Discharge status: Home / Self Care | Payer: Medicare Other | Source: Ambulatory Visit | Attending: Nurse Practitioner | Admitting: Nurse Practitioner

## 2016-01-26 DIAGNOSIS — R748 Abnormal levels of other serum enzymes: Secondary | ICD-10-CM

## 2016-01-26 DIAGNOSIS — K740 Hepatic fibrosis: Secondary | ICD-10-CM | POA: Insufficient documentation

## 2016-01-26 DIAGNOSIS — K760 Fatty (change of) liver, not elsewhere classified: Secondary | ICD-10-CM

## 2016-02-06 ENCOUNTER — Encounter: Payer: Self-pay | Admitting: Internal Medicine

## 2016-02-06 DIAGNOSIS — K760 Fatty (change of) liver, not elsewhere classified: Secondary | ICD-10-CM | POA: Diagnosis not present

## 2016-02-06 DIAGNOSIS — D696 Thrombocytopenia, unspecified: Secondary | ICD-10-CM | POA: Diagnosis not present

## 2016-02-10 DIAGNOSIS — K760 Fatty (change of) liver, not elsewhere classified: Secondary | ICD-10-CM | POA: Insufficient documentation

## 2016-04-07 ENCOUNTER — Telehealth: Payer: Self-pay | Admitting: Internal Medicine

## 2016-04-07 DIAGNOSIS — R739 Hyperglycemia, unspecified: Secondary | ICD-10-CM

## 2016-04-07 DIAGNOSIS — I10 Essential (primary) hypertension: Secondary | ICD-10-CM

## 2016-04-07 DIAGNOSIS — E041 Nontoxic single thyroid nodule: Secondary | ICD-10-CM

## 2016-04-07 DIAGNOSIS — R972 Elevated prostate specific antigen [PSA]: Secondary | ICD-10-CM

## 2016-04-07 DIAGNOSIS — E7800 Pure hypercholesterolemia, unspecified: Secondary | ICD-10-CM

## 2016-04-07 NOTE — Telephone Encounter (Signed)
Pt called request lab orders before his appt on 04/20/16. Please advise, thank you!

## 2016-04-07 NOTE — Telephone Encounter (Signed)
Can you please place lab orders.

## 2016-04-07 NOTE — Telephone Encounter (Signed)
Orders placed for labs

## 2016-04-08 NOTE — Telephone Encounter (Signed)
Notified patient.

## 2016-04-14 ENCOUNTER — Other Ambulatory Visit (INDEPENDENT_AMBULATORY_CARE_PROVIDER_SITE_OTHER): Payer: Medicare Other

## 2016-04-14 DIAGNOSIS — E7800 Pure hypercholesterolemia, unspecified: Secondary | ICD-10-CM

## 2016-04-14 DIAGNOSIS — R972 Elevated prostate specific antigen [PSA]: Secondary | ICD-10-CM | POA: Diagnosis not present

## 2016-04-14 DIAGNOSIS — I10 Essential (primary) hypertension: Secondary | ICD-10-CM

## 2016-04-14 DIAGNOSIS — R739 Hyperglycemia, unspecified: Secondary | ICD-10-CM | POA: Diagnosis not present

## 2016-04-14 LAB — BASIC METABOLIC PANEL
BUN: 15 mg/dL (ref 6–23)
CALCIUM: 9.3 mg/dL (ref 8.4–10.5)
CHLORIDE: 105 meq/L (ref 96–112)
CO2: 32 mEq/L (ref 19–32)
Creatinine, Ser: 0.98 mg/dL (ref 0.40–1.50)
GFR: 80 mL/min (ref >60.00)
Glucose, Bld: 110 mg/dL — ABNORMAL HIGH (ref 70–99)
Potassium: 4.5 mEq/L (ref 3.5–5.1)
SODIUM: 141 meq/L (ref 135–145)

## 2016-04-14 LAB — CBC WITH DIFFERENTIAL/PLATELET
BASOS PCT: 0.3 % (ref 0.0–3.0)
Basophils Absolute: 0 10*3/uL (ref 0.0–0.1)
EOS PCT: 2.3 % (ref 0.0–5.0)
Eosinophils Absolute: 0.2 10*3/uL (ref 0.0–0.7)
HCT: 42.5 % (ref 39.0–52.0)
Hemoglobin: 14.3 g/dL (ref 13.0–17.0)
LYMPHS ABS: 2.1 10*3/uL (ref 0.7–4.0)
Lymphocytes Relative: 23 % (ref 12.0–46.0)
MCHC: 33.7 g/dL (ref 30.0–36.0)
MCV: 89.3 fl (ref 78.0–100.0)
MONO ABS: 0.7 10*3/uL (ref 0.1–1.0)
Monocytes Relative: 8.3 % (ref 3.0–12.0)
NEUTROS ABS: 5.9 10*3/uL (ref 1.4–7.7)
NEUTROS PCT: 66.1 % (ref 43.0–77.0)
Platelets: 171 10*3/uL (ref 150.0–400.0)
RBC: 4.76 Mil/uL (ref 4.22–5.81)
RDW: 13.1 % (ref 11.5–15.5)
WBC: 9 10*3/uL (ref 4.0–10.5)

## 2016-04-14 LAB — LIPID PANEL
CHOLESTEROL: 219 mg/dL — AB (ref 0–200)
HDL: 46.7 mg/dL (ref >39.00)
LDL Cholesterol: 159 mg/dL — ABNORMAL HIGH (ref 0–99)
NonHDL: 172.47
Total CHOL/HDL Ratio: 5
Triglycerides: 66 mg/dL (ref 0.0–149.0)
VLDL: 13.2 mg/dL (ref 0.0–40.0)

## 2016-04-14 LAB — HEMOGLOBIN A1C: HEMOGLOBIN A1C: 5.8 % (ref 4.6–6.5)

## 2016-04-14 LAB — HEPATIC FUNCTION PANEL
ALBUMIN: 4.2 g/dL (ref 3.5–5.2)
ALT: 13 U/L (ref 0–53)
AST: 15 U/L (ref 0–37)
Alkaline Phosphatase: 53 U/L (ref 39–117)
Bilirubin, Direct: 0.1 mg/dL (ref 0.0–0.3)
Total Bilirubin: 0.6 mg/dL (ref 0.2–1.2)
Total Protein: 6.7 g/dL (ref 6.0–8.3)

## 2016-04-14 LAB — PSA, MEDICARE: PSA: 1.84 ng/ml (ref 0.10–4.00)

## 2016-04-15 ENCOUNTER — Encounter: Payer: Self-pay | Admitting: Internal Medicine

## 2016-04-20 ENCOUNTER — Ambulatory Visit (INDEPENDENT_AMBULATORY_CARE_PROVIDER_SITE_OTHER): Payer: Medicare Other | Admitting: Internal Medicine

## 2016-04-20 ENCOUNTER — Encounter: Payer: Self-pay | Admitting: Internal Medicine

## 2016-04-20 VITALS — BP 130/78 | HR 59 | Temp 98.0°F | Resp 16 | Ht 68.5 in | Wt 181.1 lb

## 2016-04-20 DIAGNOSIS — R739 Hyperglycemia, unspecified: Secondary | ICD-10-CM

## 2016-04-20 DIAGNOSIS — I10 Essential (primary) hypertension: Secondary | ICD-10-CM

## 2016-04-20 DIAGNOSIS — E041 Nontoxic single thyroid nodule: Secondary | ICD-10-CM

## 2016-04-20 DIAGNOSIS — E7800 Pure hypercholesterolemia, unspecified: Secondary | ICD-10-CM | POA: Diagnosis not present

## 2016-04-20 DIAGNOSIS — R972 Elevated prostate specific antigen [PSA]: Secondary | ICD-10-CM

## 2016-04-20 DIAGNOSIS — Z0000 Encounter for general adult medical examination without abnormal findings: Secondary | ICD-10-CM

## 2016-04-20 DIAGNOSIS — I2510 Atherosclerotic heart disease of native coronary artery without angina pectoris: Secondary | ICD-10-CM

## 2016-04-20 DIAGNOSIS — I700 Atherosclerosis of aorta: Secondary | ICD-10-CM

## 2016-04-20 MED ORDER — ATORVASTATIN CALCIUM 10 MG PO TABS: 10.0000 mg | ORAL_TABLET | Freq: Every day | ORAL | 2 refills | Status: DC

## 2016-04-20 NOTE — Progress Notes (Signed)
Pre-visit discussion using our clinic review tool. No additional management support is needed unless otherwise documented below in the visit note.  

## 2016-04-20 NOTE — Assessment & Plan Note (Signed)
Worked up by Dr Eddie Dibbles.  Previous biopsy negative.  Just evaluated 12/2015.  Had f/u ultrasound.  Stable.  Recommended f/u prn.

## 2016-04-20 NOTE — Progress Notes (Signed)
Patient ID: Nathan Watts, male   DOB: 12-Jan-1946, 71 y.o.   MRN: [id number]   Subjective:    Patient ID: Nathan Watts, male    DOB: 1945-07-29, 71 y.o.   MRN: [id number]  HPI  Patient here for his physical exam.  He has adjusted his diet.  Is exercising.  Lost weight.  Feels good.  No chest pain.  No sob.  No acid reflux.  No abdominal pain or cramping.  Bowels stable.  Has stopped EtOH.  Has received two of the three twinrix injections.  Worked up for abnormal liver function tests.  GIs notes reviewed.     Past Medical History:  Diagnosis Date  . Anemia, unspecified   . GERD (gastroesophageal reflux disease)   . Hypercholesterolemia   . Hyperglycemia   . Hypertension   . Thrombocytopenia ([place])   . Thyroid nodule    Past Surgical History:  Procedure Laterality Date  . COLONOSCOPY WITH PROPOFOL N/A 10/02/2014   Procedure: COLONOSCOPY WITH PROPOFOL;  Surgeon: Manya Silvas, MD;  Location: [facility] ENDOSCOPY;  Service: Endoscopy;  Laterality: N/A;  . INGUINAL HERNIA REPAIR    . TONSILLECTOMY     Family History  Problem Relation Age of Onset  . Prostate cancer Father   . Heart disease Father     s/p CABG  . Stroke Mother   . Colon cancer Neg Hx    Social History   Social History  . Marital status: Married    Spouse name: N/A  . Number of children: 4  . Years of education: N/A   Occupational History  . retired    Social History Main Topics  . Smoking status: Former Smoker    Packs/day: 1.00    Years: 30.00    Types: Cigarettes    Quit date: 04/12/1998  . Smokeless tobacco: Never Used  . Alcohol use No  . Drug use: No  . Sexual activity: No   Other Topics Concern  . None   Social History Narrative   He is married. Has four children    Outpatient Encounter Prescriptions as of 04/20/2016  Medication Sig  . aspirin 81 MG tablet Take 81 mg by mouth daily.  . cyanocobalamin 500 MCG tablet Take 500 mcg by mouth daily.  . finasteride (PROSCAR) 5 MG tablet   .  Lactobacillus (PROBIOTIC ACIDOPHILUS PO) Take by mouth daily.  . Multiple Vitamin (MULTI VITAMIN MENS PO) Take 1 tablet by mouth daily.  . Omega-3 Fatty Acids (FISH OIL) 1200 MG CAPS Take 1 capsule by mouth daily.  . Omeprazole Magnesium (PRILOSEC OTC PO) Take by mouth daily.  Marland Kitchen atorvastatin (LIPITOR) 10 MG tablet Take 1 tablet (10 mg total) by mouth daily.  . [DISCONTINUED] atorvastatin (LIPITOR) 40 MG tablet TAKE 1 BY MOUTH DAILY (Patient not taking: Reported on 04/20/2016)  . [DISCONTINUED] lisinopril (PRINIVIL,ZESTRIL) 10 MG tablet Take 1 tablet (10 mg total) by mouth daily. (Patient not taking: Reported on 04/20/2016)   No facility-administered encounter medications on file as of 04/20/2016.     Review of Systems  Constitutional: Negative for appetite change and unexpected weight change.  HENT: Negative for congestion and sinus pressure.   Eyes: Negative for pain and visual disturbance.  Respiratory: Negative for cough, chest tightness and shortness of breath.   Cardiovascular: Negative for chest pain, palpitations and leg swelling.  Gastrointestinal: Negative for abdominal pain, diarrhea, nausea and vomiting.  Genitourinary: Negative for difficulty urinating and dysuria.  Musculoskeletal: Negative for back pain and joint  swelling.  Skin: Negative for color change and rash.  Neurological: Negative for dizziness, light-headedness and headaches.  Hematological: Negative for adenopathy. Does not bruise/bleed easily.  Psychiatric/Behavioral: Negative for agitation and dysphoric mood.       Objective:     Blood pressure rechecked by me:  130/78  Physical Exam  Constitutional: He is oriented to person, place, and time. He appears well-developed and well-nourished. No distress.  HENT:  Head: Normocephalic and atraumatic.  Nose: Nose normal.  Mouth/Throat: Oropharynx is clear and moist. No oropharyngeal exudate.  Eyes: Conjunctivae are normal. Right eye exhibits no discharge. Left eye  exhibits no discharge.  Neck: Neck supple. No thyromegaly present.  Cardiovascular: Normal rate and regular rhythm.   Pulmonary/Chest: Breath sounds normal. No respiratory distress. He has no wheezes.  Abdominal: Soft. Bowel sounds are normal. There is no tenderness.  Genitourinary:  Genitourinary Comments: Rectal - enlarged prostate.  No nodules appreciated.  Heme negative.    Musculoskeletal: He exhibits no edema or tenderness.  Lymphadenopathy:    He has no cervical adenopathy.  Neurological: He is alert and oriented to person, place, and time.  Skin: Skin is warm and dry. No rash noted. No erythema.  Psychiatric: He has a normal mood and affect. His behavior is normal.    BP 130/78   Pulse (!) 59   Temp 98 F (36.7 C) (Oral)   Resp 16   Ht 5' 8.5" (1.74 m)   Wt 181 lb 2 oz (82.2 kg)   SpO2 97%   BMI 27.14 kg/m  Wt Readings from Last 3 Encounters:  04/20/16 181 lb 2 oz (82.2 kg)  10/16/15 202 lb 2 oz (91.7 kg)  07/07/15 195 lb 2 oz (88.5 kg)     Lab Results  Component Value Date   WBC 9.0 04/14/2016   HGB 14.3 04/14/2016   HCT 42.5 04/14/2016   PLT 171.0 04/14/2016   GLUCOSE 110 (H) 04/14/2016   CHOL 219 (H) 04/14/2016   TRIG 66.0 04/14/2016   HDL 46.70 04/14/2016   LDLCALC 159 (H) 04/14/2016   ALT 13 04/14/2016   AST 15 04/14/2016   NA 141 04/14/2016   K 4.5 04/14/2016   CL 105 04/14/2016   CREATININE 0.98 04/14/2016   BUN 15 04/14/2016   CO2 32 04/14/2016   TSH 1.26 11/14/2015   PSA 1.84 04/14/2016   HGBA1C 5.8 04/14/2016    US Liver Elastography W/o Img  Result Date: 01/26/2016 CLINICAL DATA:  Elevated LFTs, fatty liver EXAM: ULTRASOUND HEPATIC ELASTOGRAPHY TECHNIQUE: Ultrasound elastography evaluation of the liver was performed. A region of interest was placed in the right lobe of the liver. Following application of a compressive sonographic pulse, shear waves were detected in the adjacent hepatic tissue and the shear wave velocity was calculated.  Multiple assessments were performed at the selected site. Median shear wave velocity is correlated to a Metavir fibrosis score. COMPARISON:  Right upper quadrant ultrasound dated 01/20/2016 FINDINGS: Device: Siemens Helix VTQ Patient position: Supine Transducer 6C1 Number of measurements: 10 Hepatic segment:  8 Median velocity:   1.44  m/sec IQR: 0.19 IQR/Median velocity ratio: 0.132 Corresponding Metavir fibrosis score:  F2 + some F3 Risk of fibrosis: Moderate Limitations of exam: None Pertinent findings noted on other imaging exams:  None Please note that abnormal shear wave velocities may also be identified in clinical settings other than with hepatic fibrosis, such as: acute hepatitis, elevated right heart and central venous pressures including use of beta blockers, veno-occlusive  disease (Budd-Chiari), infiltrative processes such as mastocytosis/amyloidosis/infiltrative tumor, extrahepatic cholestasis, in the post-prandial state, and liver transplantation. Correlation with patient history, laboratory data, and clinical condition recommended. IMPRESSION: Median hepatic shear wave velocity is calculated at 1.44 m/sec. 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 Risk of fibrosis is Moderate. Follow-up: Additional testing appropriate Electronically Signed   By: Julian Hy M.D.   On: 01/26/2016 09:27       Assessment & Plan:   Problem List Items Addressed This Visit    Aortic atherosclerosis ([place])    Restart lipitor.        Relevant Medications   atorvastatin (LIPITOR) 10 MG tablet   Coronary artery disease    Some underlying CAD - plaque in the distal RCA.  Asymptomatic.  Walking.  Continue risk factor modification.  Restart lipitor - low dose.  Will need to follow liver function tests.        Relevant Medications   atorvastatin (LIPITOR) 10 MG tablet   Elevated PSA    Negative prostate biopsy.  Has seen Dr Jacqlyn Larsen.  PSA 1.84 last check.        Health care maintenance - Primary     Physical today 04/20/16.  Colonoscopy 10/02/14 - internal hemorrhoids, otherwise normal.  Recommended f/u colonoscopy in 10 years.        Hypercholesterolemia    Recently worked up for abnormal liver tests.  Will restart lipitor.  Had problems with crestor.  Low cholesterol diet and exercise.  Follow lipid panel and liver function tests.       Relevant Medications   atorvastatin (LIPITOR) 10 MG tablet   Other Relevant Orders   Hepatic function panel   Hyperglycemia    Has adjusted diet.  Lost weight.  Doing well with diet and exercise.  Follow.        Hypertension, essential    Blood pressure doing well on no medication.  Follow pressures.  Follow metabolic panel.        Relevant Medications   atorvastatin (LIPITOR) 10 MG tablet   Thyroid nodule    Worked up by Dr Eddie Dibbles.  Previous biopsy negative.  Just evaluated 12/2015.  Had f/u ultrasound.  Stable.  Recommended f/u prn.           Einar Pheasant, MD

## 2016-04-20 NOTE — Assessment & Plan Note (Signed)
Physical today 04/20/16.  Colonoscopy 10/02/14 - internal hemorrhoids, otherwise normal.  Recommended f/u colonoscopy in 10 years.

## 2016-04-25 ENCOUNTER — Encounter: Payer: Self-pay | Admitting: Internal Medicine

## 2016-04-25 NOTE — Assessment & Plan Note (Signed)
Recently worked up for abnormal liver tests.  Will restart lipitor.  Had problems with crestor.  Low cholesterol diet and exercise.  Follow lipid panel and liver function tests.

## 2016-04-25 NOTE — Assessment & Plan Note (Signed)
Blood pressure doing well on no medication.  Follow pressures.  Follow metabolic panel.

## 2016-04-25 NOTE — Assessment & Plan Note (Signed)
Has adjusted diet.  Lost weight.  Doing well with diet and exercise.  Follow.

## 2016-04-25 NOTE — Assessment & Plan Note (Signed)
Some underlying CAD - plaque in the distal RCA.  Asymptomatic.  Walking.  Continue risk factor modification.  Restart lipitor - low dose.  Will need to follow liver function tests.

## 2016-04-25 NOTE — Assessment & Plan Note (Signed)
Negative prostate biopsy.  Has seen Dr Jacqlyn Larsen.  PSA 1.84 last check.

## 2016-04-25 NOTE — Assessment & Plan Note (Signed)
Restart lipitor

## 2016-05-04 ENCOUNTER — Other Ambulatory Visit (INDEPENDENT_AMBULATORY_CARE_PROVIDER_SITE_OTHER): Payer: Medicare Other

## 2016-05-04 DIAGNOSIS — E7800 Pure hypercholesterolemia, unspecified: Secondary | ICD-10-CM

## 2016-05-04 LAB — HEPATIC FUNCTION PANEL
ALBUMIN: 4.1 g/dL (ref 3.5–5.2)
ALT: 13 U/L (ref 0–53)
AST: 16 U/L (ref 0–37)
Alkaline Phosphatase: 50 U/L (ref 39–117)
BILIRUBIN DIRECT: 0.1 mg/dL (ref 0.0–0.3)
TOTAL PROTEIN: 7.1 g/dL (ref 6.0–8.3)
Total Bilirubin: 0.6 mg/dL (ref 0.2–1.2)

## 2016-05-05 ENCOUNTER — Other Ambulatory Visit: Payer: Self-pay | Admitting: Internal Medicine

## 2016-05-05 DIAGNOSIS — E7800 Pure hypercholesterolemia, unspecified: Secondary | ICD-10-CM

## 2016-05-05 NOTE — Progress Notes (Signed)
Order placed for f/u liver panel.  

## 2016-05-08 DIAGNOSIS — S61210A Laceration without foreign body of right index finger without damage to nail, initial encounter: Secondary | ICD-10-CM | POA: Diagnosis not present

## 2016-05-21 DIAGNOSIS — H2511 Age-related nuclear cataract, right eye: Secondary | ICD-10-CM | POA: Diagnosis not present

## 2016-05-27 ENCOUNTER — Other Ambulatory Visit (INDEPENDENT_AMBULATORY_CARE_PROVIDER_SITE_OTHER): Payer: Medicare Other

## 2016-05-27 DIAGNOSIS — E7800 Pure hypercholesterolemia, unspecified: Secondary | ICD-10-CM

## 2016-05-27 LAB — HEPATIC FUNCTION PANEL
ALBUMIN: 4.3 g/dL (ref 3.5–5.2)
ALT: 15 U/L (ref 0–53)
AST: 18 U/L (ref 0–37)
Alkaline Phosphatase: 54 U/L (ref 39–117)
BILIRUBIN DIRECT: 0.2 mg/dL (ref 0.0–0.3)
TOTAL PROTEIN: 7.3 g/dL (ref 6.0–8.3)
Total Bilirubin: 0.6 mg/dL (ref 0.2–1.2)

## 2016-05-28 ENCOUNTER — Encounter: Payer: Self-pay | Admitting: Internal Medicine

## 2016-05-28 ENCOUNTER — Other Ambulatory Visit: Payer: Self-pay | Admitting: Internal Medicine

## 2016-05-28 DIAGNOSIS — M7542 Impingement syndrome of left shoulder: Secondary | ICD-10-CM | POA: Diagnosis not present

## 2016-05-28 DIAGNOSIS — M7582 Other shoulder lesions, left shoulder: Secondary | ICD-10-CM | POA: Diagnosis not present

## 2016-05-28 DIAGNOSIS — E7800 Pure hypercholesterolemia, unspecified: Secondary | ICD-10-CM

## 2016-05-28 DIAGNOSIS — M25512 Pain in left shoulder: Secondary | ICD-10-CM | POA: Diagnosis not present

## 2016-05-28 DIAGNOSIS — G8929 Other chronic pain: Secondary | ICD-10-CM | POA: Diagnosis not present

## 2016-05-28 NOTE — Progress Notes (Signed)
Order placed for f/u liver panel check.  

## 2016-06-09 DIAGNOSIS — M7542 Impingement syndrome of left shoulder: Secondary | ICD-10-CM | POA: Diagnosis not present

## 2016-06-09 DIAGNOSIS — M25512 Pain in left shoulder: Secondary | ICD-10-CM | POA: Diagnosis not present

## 2016-06-09 DIAGNOSIS — M6281 Muscle weakness (generalized): Secondary | ICD-10-CM | POA: Diagnosis not present

## 2016-06-09 DIAGNOSIS — M25612 Stiffness of left shoulder, not elsewhere classified: Secondary | ICD-10-CM | POA: Diagnosis not present

## 2016-06-10 DIAGNOSIS — N138 Other obstructive and reflux uropathy: Secondary | ICD-10-CM | POA: Diagnosis not present

## 2016-06-10 DIAGNOSIS — N401 Enlarged prostate with lower urinary tract symptoms: Secondary | ICD-10-CM | POA: Diagnosis not present

## 2016-06-10 DIAGNOSIS — R972 Elevated prostate specific antigen [PSA]: Secondary | ICD-10-CM | POA: Diagnosis not present

## 2016-06-10 DIAGNOSIS — N411 Chronic prostatitis: Secondary | ICD-10-CM | POA: Diagnosis not present

## 2016-06-14 ENCOUNTER — Other Ambulatory Visit: Payer: Self-pay | Admitting: Internal Medicine

## 2016-06-14 DIAGNOSIS — M25612 Stiffness of left shoulder, not elsewhere classified: Secondary | ICD-10-CM | POA: Diagnosis not present

## 2016-06-14 DIAGNOSIS — M6281 Muscle weakness (generalized): Secondary | ICD-10-CM | POA: Diagnosis not present

## 2016-06-14 DIAGNOSIS — M7542 Impingement syndrome of left shoulder: Secondary | ICD-10-CM | POA: Diagnosis not present

## 2016-06-14 DIAGNOSIS — M25512 Pain in left shoulder: Secondary | ICD-10-CM | POA: Diagnosis not present

## 2016-06-18 DIAGNOSIS — L821 Other seborrheic keratosis: Secondary | ICD-10-CM | POA: Diagnosis not present

## 2016-06-18 DIAGNOSIS — L718 Other rosacea: Secondary | ICD-10-CM | POA: Diagnosis not present

## 2016-06-18 DIAGNOSIS — Z08 Encounter for follow-up examination after completed treatment for malignant neoplasm: Secondary | ICD-10-CM | POA: Diagnosis not present

## 2016-06-18 DIAGNOSIS — Z85828 Personal history of other malignant neoplasm of skin: Secondary | ICD-10-CM | POA: Diagnosis not present

## 2016-06-30 DIAGNOSIS — M6281 Muscle weakness (generalized): Secondary | ICD-10-CM | POA: Diagnosis not present

## 2016-06-30 DIAGNOSIS — M25512 Pain in left shoulder: Secondary | ICD-10-CM | POA: Diagnosis not present

## 2016-06-30 DIAGNOSIS — M25612 Stiffness of left shoulder, not elsewhere classified: Secondary | ICD-10-CM | POA: Diagnosis not present

## 2016-06-30 DIAGNOSIS — M7542 Impingement syndrome of left shoulder: Secondary | ICD-10-CM | POA: Diagnosis not present

## 2016-07-09 ENCOUNTER — Encounter: Payer: Self-pay | Admitting: Internal Medicine

## 2016-07-09 DIAGNOSIS — E7800 Pure hypercholesterolemia, unspecified: Secondary | ICD-10-CM

## 2016-07-13 NOTE — Telephone Encounter (Signed)
Orders placed for labs

## 2016-07-14 ENCOUNTER — Other Ambulatory Visit (INDEPENDENT_AMBULATORY_CARE_PROVIDER_SITE_OTHER): Payer: Medicare Other

## 2016-07-14 ENCOUNTER — Encounter: Payer: Self-pay | Admitting: Internal Medicine

## 2016-07-14 DIAGNOSIS — E7800 Pure hypercholesterolemia, unspecified: Secondary | ICD-10-CM

## 2016-07-14 LAB — BASIC METABOLIC PANEL
BUN: 15 mg/dL (ref 6–23)
CALCIUM: 9.3 mg/dL (ref 8.4–10.5)
CO2: 31 mEq/L (ref 19–32)
CREATININE: 0.98 mg/dL (ref 0.40–1.50)
Chloride: 106 mEq/L (ref 96–112)
GFR: 79.94 mL/min (ref >60.00)
GLUCOSE: 98 mg/dL (ref 70–99)
Potassium: 4.5 mEq/L (ref 3.5–5.1)
Sodium: 141 mEq/L (ref 135–145)

## 2016-07-14 LAB — HEPATIC FUNCTION PANEL
ALT: 15 U/L (ref 0–53)
AST: 16 U/L (ref 0–37)
Albumin: 4 g/dL (ref 3.5–5.2)
Alkaline Phosphatase: 47 U/L (ref 39–117)
BILIRUBIN DIRECT: 0.1 mg/dL (ref 0.0–0.3)
BILIRUBIN TOTAL: 0.6 mg/dL (ref 0.2–1.2)
Total Protein: 6.6 g/dL (ref 6.0–8.3)

## 2016-07-14 LAB — LIPID PANEL
CHOLESTEROL: 151 mg/dL (ref 0–200)
HDL: 49.6 mg/dL (ref >39.00)
LDL CALC: 90 mg/dL (ref 0–99)
NonHDL: 101.89
TRIGLYCERIDES: 59 mg/dL (ref 0.0–149.0)
Total CHOL/HDL Ratio: 3
VLDL: 11.8 mg/dL (ref 0.0–40.0)

## 2016-07-15 MED ORDER — ATORVASTATIN CALCIUM 10 MG PO TABS: 10.0000 mg | ORAL_TABLET | Freq: Every day | ORAL | 1 refills | Status: DC

## 2016-07-15 NOTE — Telephone Encounter (Signed)
rx sent in for lipitor 10mg  #90 with one refill.

## 2016-08-02 ENCOUNTER — Other Ambulatory Visit (INDEPENDENT_AMBULATORY_CARE_PROVIDER_SITE_OTHER): Payer: Self-pay | Admitting: Vascular Surgery

## 2016-08-02 DIAGNOSIS — I700 Atherosclerosis of aorta: Secondary | ICD-10-CM

## 2016-08-02 DIAGNOSIS — I708 Atherosclerosis of other arteries: Secondary | ICD-10-CM

## 2016-08-03 ENCOUNTER — Ambulatory Visit (INDEPENDENT_AMBULATORY_CARE_PROVIDER_SITE_OTHER): Payer: Medicare Other

## 2016-08-03 ENCOUNTER — Ambulatory Visit (INDEPENDENT_AMBULATORY_CARE_PROVIDER_SITE_OTHER): Payer: Medicare Other | Admitting: Vascular Surgery

## 2016-08-03 ENCOUNTER — Encounter (INDEPENDENT_AMBULATORY_CARE_PROVIDER_SITE_OTHER): Payer: Self-pay | Admitting: Vascular Surgery

## 2016-08-03 VITALS — BP 114/66 | HR 44 | Resp 16 | Wt 181.0 lb

## 2016-08-03 DIAGNOSIS — I708 Atherosclerosis of other arteries: Secondary | ICD-10-CM | POA: Diagnosis not present

## 2016-08-03 DIAGNOSIS — E7800 Pure hypercholesterolemia, unspecified: Secondary | ICD-10-CM | POA: Diagnosis not present

## 2016-08-03 DIAGNOSIS — I723 Aneurysm of iliac artery: Secondary | ICD-10-CM | POA: Diagnosis not present

## 2016-08-03 DIAGNOSIS — I2510 Atherosclerotic heart disease of native coronary artery without angina pectoris: Secondary | ICD-10-CM | POA: Diagnosis not present

## 2016-08-03 DIAGNOSIS — I700 Atherosclerosis of aorta: Secondary | ICD-10-CM | POA: Diagnosis not present

## 2016-08-03 NOTE — Progress Notes (Signed)
MRN : [id number]  Nathan Watts is a 71 y.o. (1945/05/22) male who presents with chief complaint of  Chief Complaint  Patient presents with  . Follow-up  .  History of Present Illness: Patient returns today in follow up of Bilateral common iliac artery aneurysms. He is doing well today without complaints. He denies any aneurysm related symptoms. Specifically, the patient denies new back or abdominal pain, or signs of peripheral embolization. He does not smoke and he says his blood pressure is under good control. His duplex today shows an ectatic aorta without an aneurysm in stable sizes of his iliac arteries at 1.4 cm on the right and 1.5 cm left.  Current Outpatient Prescriptions  Medication Sig Dispense Refill  . aspirin 81 MG tablet Take 81 mg by mouth daily.    Marland Kitchen atorvastatin (LIPITOR) 10 MG tablet Take 1 tablet (10 mg total) by mouth daily. 90 tablet 1  . cyanocobalamin 500 MCG tablet Take 500 mcg by mouth daily.    . finasteride (PROSCAR) 5 MG tablet   3  . Lactobacillus (PROBIOTIC ACIDOPHILUS PO) Take by mouth daily.    . Multiple Vitamin (MULTI VITAMIN MENS PO) Take 1 tablet by mouth daily.    . Omega-3 Fatty Acids (FISH OIL) 1200 MG CAPS Take 1 capsule by mouth daily.    . Omeprazole Magnesium (PRILOSEC OTC PO) Take by mouth daily.     No current facility-administered medications for this visit.     Past Medical History:  Diagnosis Date  . Anemia, unspecified   . GERD (gastroesophageal reflux disease)   . Hypercholesterolemia   . Hyperglycemia   . Hypertension   . Thrombocytopenia ([place])   . Thyroid nodule     Past Surgical History:  Procedure Laterality Date  . COLONOSCOPY WITH PROPOFOL N/A 10/02/2014   Procedure: COLONOSCOPY WITH PROPOFOL;  Surgeon: Manya Silvas, MD;  Location: [facility] ENDOSCOPY;  Service: Endoscopy;  Laterality: N/A;  . INGUINAL HERNIA REPAIR    . TONSILLECTOMY      Social History Social History  Substance Use Topics  . Smoking status:  Former Smoker    Packs/day: 1.00    Years: 30.00    Types: Cigarettes    Quit date: 04/12/1998  . Smokeless tobacco: Never Used  . Alcohol use No     Family History Family History  Problem Relation Age of Onset  . Prostate cancer Father   . Heart disease Father     s/p CABG  . Stroke Mother   . Colon cancer Neg Hx     Allergies  Allergen Reactions  . Hydrocodone-Chlorpheniramine Other (See Comments)    Unknown, possible causes anxiety  . Crestor [Rosuvastatin] Rash    Causes GI upset and anxiety     REVIEW OF SYSTEMS (Negative unless checked)  Constitutional: [] Weight loss  [] Fever  [] Chills Cardiac: [] Chest pain   [] Chest pressure   [] Palpitations   [] Shortness of breath when laying flat   [] Shortness of breath at rest   [] Shortness of breath with exertion. Vascular:  [] Pain in legs with walking   [] Pain in legs at rest   [] Pain in legs when laying flat   [] Claudication   [] Pain in feet when walking  [] Pain in feet at rest  [] Pain in feet when laying flat   [] History of DVT   [] Phlebitis   [] Swelling in legs   [] Varicose veins   [] Non-healing ulcers Pulmonary:   [] Uses home oxygen   [] Productive cough   []   Hemoptysis   [] Wheeze  [] COPD   [] Asthma Neurologic:  [] Dizziness  [] Blackouts   [] Seizures   [] History of stroke   [] History of TIA  [] Aphasia   [] Temporary blindness   [] Dysphagia   [] Weakness or numbness in arms   [] Weakness or numbness in legs Musculoskeletal:  [] Arthritis   [] Joint swelling   [] Joint pain   [] Low back pain Hematologic:  [] Easy bruising  [] Easy bleeding   [] Hypercoagulable state   [] Anemic   Gastrointestinal:  [] Blood in stool   [] Vomiting blood  [] Gastroesophageal reflux/heartburn   [] Abdominal pain Genitourinary:  [] Chronic kidney disease   [] Difficult urination  [] Frequent urination  [] Burning with urination   [] Hematuria Skin:  [] Rashes   [] Ulcers   [] Wounds Psychological:  [] History of anxiety   []  History of major depression.  Physical  Examination  BP 114/66   Pulse (!) 44   Resp 16   Wt 82.1 kg (181 lb)   BMI 27.12 kg/m  Gen:  WD/WN, NAD. Appears younger than stated age Head: [place]/AT, No temporalis wasting. Ear/Nose/Throat: Hearing grossly intact, nares w/o erythema or drainage, trachea midline Eyes: Conjunctiva clear. Sclera non-icteric Neck: Supple.  No JVD.  Pulmonary:  Good air movement, no use of accessory muscles.  Cardiac: RRR Vascular:  Vessel Right Left  Radial Palpable Palpable                                   Gastrointestinal: soft, non-tender/non-distended. Musculoskeletal: M/S 5/5 throughout.  No deformity or atrophy. No edema. Neurologic: Sensation grossly intact in extremities.  Symmetrical.  Speech is fluent.  Psychiatric: Judgment intact, Mood & affect appropriate for pt's clinical situation. Dermatologic: No rashes or ulcers noted.  No cellulitis or open wounds.       Labs Recent Results (from the past 2160 hour(s))  Hepatic function panel     Status: None   Collection Time: 05/27/16 10:47 AM  Result Value Ref Range   Total Bilirubin 0.6 0.2 - 1.2 mg/dL   Bilirubin, Direct 0.2 0.0 - 0.3 mg/dL   Alkaline Phosphatase 54 39 - 117 U/L   AST 18 0 - 37 U/L   ALT 15 0 - 53 U/L   Total Protein 7.3 6.0 - 8.3 g/dL   Albumin 4.3 3.5 - 5.2 g/dL  Hepatic function panel     Status: None   Collection Time: 07/14/16  8:25 AM  Result Value Ref Range   Total Bilirubin 0.6 0.2 - 1.2 mg/dL   Bilirubin, Direct 0.1 0.0 - 0.3 mg/dL   Alkaline Phosphatase 47 39 - 117 U/L   AST 16 0 - 37 U/L   ALT 15 0 - 53 U/L   Total Protein 6.6 6.0 - 8.3 g/dL   Albumin 4.0 3.5 - 5.2 g/dL  Lipid panel     Status: None   Collection Time: 07/14/16  8:25 AM  Result Value Ref Range   Cholesterol 151 0 - 200 mg/dL    Comment: ATP III Classification       Desirable:  < 200 mg/dL               Borderline High:  200 - 239 mg/dL          High:  > = 240 mg/dL   Triglycerides 59.0 0.0 - 149.0 mg/dL    Comment:  Normal:  <150 mg/dLBorderline High:  150 - 199 mg/dL   HDL 49.60 >39.00  mg/dL   VLDL 11.8 0.0 - 40.0 mg/dL   LDL Cholesterol 90 0 - 99 mg/dL   Total CHOL/HDL Ratio 3     Comment:                Men          Women1/2 Average Risk     3.4          3.3Average Risk          5.0          4.42X Average Risk          9.6          7.13X Average Risk          15.0          11.0                       NonHDL 101.89     Comment: NOTE:  Non-HDL goal should be 30 mg/dL higher than patient's LDL goal (i.e. LDL goal of < 70 mg/dL, would have non-HDL goal of < 100 mg/dL)  Basic metabolic panel     Status: None   Collection Time: 07/14/16  8:25 AM  Result Value Ref Range   Sodium 141 135 - 145 mEq/L   Potassium 4.5 3.5 - 5.1 mEq/L   Chloride 106 96 - 112 mEq/L   CO2 31 19 - 32 mEq/L   Glucose, Bld 98 70 - 99 mg/dL   BUN 15 6 - 23 mg/dL   Creatinine, Ser 0.98 0.40 - 1.50 mg/dL   Calcium 9.3 8.4 - 10.5 mg/dL   GFR 79.94 >60.00 mL/min    Radiology No results found.    Assessment/Plan  Hypercholesterolemia lipid control important in reducing the progression of atherosclerotic disease. Continue statin therapy   Aortic atherosclerosis (HCC) His duplex today shows an ectatic aorta without an aneurysm in stable sizes of his iliac arteries at 1.4 cm on the right and 1.5 cm left.  Common iliac aneurysm (HCC) His duplex today shows an ectatic aorta without an aneurysm in stable sizes of his iliac arteries at 1.4 cm on the right and 1.5 cm left. This is stable from his study last year. This is well below the size for prophylactic repair. We will continue to monitor this on an annual basis. He should remain abstinent from tobacco and ensure that his blood pressure remains generally in a normal range.    Leotis Pain, MD  08/03/2016 9:13 AM    This note was created with Dragon medical transcription system.  Any errors from dictation are purely unintentional

## 2016-08-03 NOTE — Assessment & Plan Note (Signed)
His duplex today shows an ectatic aorta without an aneurysm in stable sizes of his iliac arteries at 1.4 cm on the right and 1.5 cm left. This is stable from his study last year. This is well below the size for prophylactic repair. We will continue to monitor this on an annual basis. He should remain abstinent from tobacco and ensure that his blood pressure remains generally in a normal range.

## 2016-08-03 NOTE — Assessment & Plan Note (Signed)
His duplex today shows an ectatic aorta without an aneurysm in stable sizes of his iliac arteries at 1.4 cm on the right and 1.5 cm left.

## 2016-08-03 NOTE — Assessment & Plan Note (Signed)
lipid control important in reducing the progression of atherosclerotic disease. Continue statin therapy  

## 2016-08-17 ENCOUNTER — Encounter: Payer: Self-pay | Admitting: Family Medicine

## 2016-08-17 ENCOUNTER — Ambulatory Visit (INDEPENDENT_AMBULATORY_CARE_PROVIDER_SITE_OTHER): Payer: Medicare Other | Admitting: Family Medicine

## 2016-08-17 DIAGNOSIS — S91109A Unspecified open wound of unspecified toe(s) without damage to nail, initial encounter: Secondary | ICD-10-CM

## 2016-08-17 DIAGNOSIS — B078 Other viral warts: Secondary | ICD-10-CM | POA: Diagnosis not present

## 2016-08-17 DIAGNOSIS — L538 Other specified erythematous conditions: Secondary | ICD-10-CM | POA: Diagnosis not present

## 2016-08-17 MED ORDER — MUPIROCIN 2 % EX OINT
1.0000 | TOPICAL_OINTMENT | Freq: Every day | CUTANEOUS | 0 refills | Status: DC
Start: 2016-08-17 — End: 2016-10-27

## 2016-08-17 MED ORDER — TRIAMCINOLONE ACETONIDE 0.1 % EX CREA: TOPICAL_CREAM | CUTANEOUS | 0 refills | Status: DC

## 2016-08-17 NOTE — Patient Instructions (Signed)
Use the triamcinolone to the dry areas.  You can use the other throughout.  Take care  Dr. Lacinda Axon

## 2016-08-17 NOTE — Progress Notes (Signed)
Pre visit review using our clinic review tool, if applicable. No additional management support is needed unless otherwise documented below in the visit note. 

## 2016-08-17 NOTE — Progress Notes (Signed)
Subjective:  Patient ID: Nathan Watts, male    DOB: 1945/05/24  Age: 71 y.o. MRN: [id number]  CC: Wound, Right 5th toe  HPI:  71 year old male with a history of CAD, hypertension, hyperlipidemia presents for the above complaint.  Patient reports a one-week history of a dry, irritated area of his right fifth toe. Patient is not diabetic. He states that he felt like it was a corn and applied some desiccant without sniffing improvement. The area is dry and slightly tender, particularly with pressure. No drainage from the area. No known relieving factors. No recent trauma. No other associated symptoms. No other complaints of concerns at this time.  Social Hx   Social History   Social History  . Marital status: Married    Spouse name: N/A  . Number of children: 4  . Years of education: N/A   Occupational History  . retired    Social History Main Topics  . Smoking status: Former Smoker    Packs/day: 1.00    Years: 30.00    Types: Cigarettes    Quit date: 04/12/1998  . Smokeless tobacco: Never Used  . Alcohol use No  . Drug use: No  . Sexual activity: No   Other Topics Concern  . None   Social History Narrative   He is married. Has four children    Review of Systems  Constitutional: Negative.   Skin: Positive for wound.   Objective:  BP (!) 144/78   Pulse (!) 47   Temp 97.5 F (36.4 C) (Oral)   Wt 180 lb 2 oz (81.7 kg)   SpO2 99%   BMI 26.99 kg/m   BP/Weight 08/17/2016 10/01/2977 11/18/2117  Systolic BP 417 408 144  Diastolic BP 78 66 78  Wt. (Lbs) 180.13 181 181.13  BMI 26.99 27.12 27.14   Physical Exam  Constitutional: He is oriented to person, place, and time. He appears well-developed. No distress.  Pulmonary/Chest: Effort normal.  Neurological: He is alert and oriented to person, place, and time.  Skin:  Right foot - fifth toe with surrounding dry, cracked skin and a central superficial wound (approximately 3 mm). Minimal surrounding erythema. Slightly tender  to palpation. No fluctuance.  Psychiatric: He has a normal mood and affect.  Vitals reviewed.   Lab Results  Component Value Date   WBC 9.0 04/14/2016   HGB 14.3 04/14/2016   HCT 42.5 04/14/2016   PLT 171.0 04/14/2016   GLUCOSE 98 07/14/2016   CHOL 151 07/14/2016   TRIG 59.0 07/14/2016   HDL 49.60 07/14/2016   LDLCALC 90 07/14/2016   ALT 15 07/14/2016   AST 16 07/14/2016   NA 141 07/14/2016   K 4.5 07/14/2016   CL 106 07/14/2016   CREATININE 0.98 07/14/2016   BUN 15 07/14/2016   CO2 31 07/14/2016   TSH 1.26 11/14/2015   PSA 1.84 04/14/2016   HGBA1C 5.8 04/14/2016    Assessment & Plan:   Problem List Items Addressed This Visit    Wound, open, toe    New problem. Appears to be from friction/rubbing. No evidence of infection at this time. Appears to be healing well excluding the surrounding dry/cracked skin. Treating with me prior sent to prevent infection and triamcinolone for the dry/cracked skin.         Meds ordered this encounter  Medications  . triamcinolone cream (KENALOG) 0.1 %    Sig: Apply to affected area daily.    Dispense:  30 g    Refill:  0  . mupirocin ointment (BACTROBAN) 2 %    Sig: Apply 1 application topically daily.    Dispense:  22 g    Refill:  0   Follow-up: PRN  [place]

## 2016-08-17 NOTE — Assessment & Plan Note (Signed)
New problem. Appears to be from friction/rubbing. No evidence of infection at this time. Appears to be healing well excluding the surrounding dry/cracked skin. Treating with me prior sent to prevent infection and triamcinolone for the dry/cracked skin.

## 2016-10-07 ENCOUNTER — Ambulatory Visit (INDEPENDENT_AMBULATORY_CARE_PROVIDER_SITE_OTHER): Payer: Medicare Other

## 2016-10-07 ENCOUNTER — Ambulatory Visit
Admission: EM | Admit: 2016-10-07 | Discharge: 2016-10-07 | Disposition: A | Discharge status: Home / Self Care | Payer: Medicare Other | Attending: Internal Medicine | Admitting: Internal Medicine

## 2016-10-07 DIAGNOSIS — S61212A Laceration without foreign body of right middle finger without damage to nail, initial encounter: Secondary | ICD-10-CM

## 2016-10-07 DIAGNOSIS — W312XXA Contact with powered woodworking and forming machines, initial encounter: Secondary | ICD-10-CM

## 2016-10-07 MED ORDER — MUPIROCIN CALCIUM 2 % EX CREA: 1.0000 "application " | TOPICAL_CREAM | Freq: Two times a day (BID) | CUTANEOUS | 0 refills | Status: DC

## 2016-10-07 MED ORDER — CEPHALEXIN 250 MG PO CAPS: 250.0000 mg | ORAL_CAPSULE | Freq: Four times a day (QID) | ORAL | 0 refills | Status: DC

## 2016-10-07 NOTE — ED Triage Notes (Signed)
Patient states he cut his right middle finger on a table saw this morning. Patient states he doesn't thinkn it will need stitches but couldn't get it to stop bleeding.

## 2016-10-07 NOTE — ED Provider Notes (Signed)
CSN: [id number]     Arrival date & time 10/07/16  1124 History   First MD Initiated Contact with Patient 10/07/16 1237     Chief Complaint  Patient presents with  . Extremity Laceration   (Consider location/radiation/quality/duration/timing/severity/associated sxs/prior Treatment) Patient is a 71 year old male who presents complaining of laceration to his right middle finger after cutting it on a table saw this morning. Patient reports he didn't think it needed sutures but was having trouble getting it to stop bleeding.      Past Medical History:  Diagnosis Date  . Anemia, unspecified   . GERD (gastroesophageal reflux disease)   . Hypercholesterolemia   . Hyperglycemia   . Hypertension   . Thrombocytopenia ([place])   . Thyroid nodule    Past Surgical History:  Procedure Laterality Date  . COLONOSCOPY WITH PROPOFOL N/A 10/02/2014   Procedure: COLONOSCOPY WITH PROPOFOL;  Surgeon: Manya Silvas, MD;  Location: [facility] ENDOSCOPY;  Service: Endoscopy;  Laterality: N/A;  . INGUINAL HERNIA REPAIR    . TONSILLECTOMY     Family History  Problem Relation Age of Onset  . Prostate cancer Father   . Heart disease Father        s/p CABG  . Stroke Mother   . Colon cancer Neg Hx    Social History  Substance Use Topics  . Smoking status: Former Smoker    Packs/day: 1.00    Years: 30.00    Types: Cigarettes    Quit date: 04/12/1998  . Smokeless tobacco: Former Systems developer    Quit date: 04/12/1999  . Alcohol use Not on file    Review of Systems  Skin: Positive for wound.  As noted in history of present illness. Other systems reviewed and found to be negative  Allergies  Hydrocodone-chlorpheniramine and Crestor [rosuvastatin]  Home Medications   Prior to Admission medications   Medication Sig Start Date End Date Taking? Authorizing Provider  aspirin 81 MG tablet Take 81 mg by mouth daily.    [provider]  atorvastatin (LIPITOR) 10 MG tablet Take 1 tablet (10 mg total) by  mouth daily. 07/15/16   Einar Pheasant, MD  cephALEXin (KEFLEX) 250 MG capsule Take 1 capsule (250 mg total) by mouth 4 (four) times daily. 10/07/16   Luvenia Redden, PA-C  cyanocobalamin 500 MCG tablet Take 500 mcg by mouth daily.    [provider]  finasteride (PROSCAR) 5 MG tablet  05/30/15   [provider]  Lactobacillus (PROBIOTIC ACIDOPHILUS PO) Take by mouth daily.    [provider]  Multiple Vitamin (MULTI VITAMIN MENS PO) Take 1 tablet by mouth daily.    [provider]  mupirocin cream (BACTROBAN) 2 % Apply 1 application topically 2 (two) times daily. 10/07/16   Luvenia Redden, PA-C  mupirocin ointment (BACTROBAN) 2 % Apply 1 application topically daily. 08/17/16   Coral Spikes, DO  Omega-3 Fatty Acids (FISH OIL) 1200 MG CAPS Take 1 capsule by mouth daily.    [provider]  Omeprazole Magnesium (PRILOSEC OTC PO) Take by mouth daily.    [provider]  triamcinolone cream (KENALOG) 0.1 % Apply to affected area daily. 08/17/16   Coral Spikes, DO   Meds Ordered and Administered this Visit  Medications - No data to display  BP 121/70 (BP Location: Left Arm)   Pulse 91   Temp 98.2 F (36.8 C) (Oral)   Resp 16   Ht 5\' 10"  (1.778 m)   Wt  183 lb (83 kg)   SpO2 98%   BMI 26.26 kg/m  No data found.   Physical Exam  Constitutional: He is oriented to person, place, and time. He appears well-developed and well-nourished.  HENT:  Head: Normocephalic.  Eyes: EOM are normal. Pupils are equal, round, and reactive to light.  Neck: Normal range of motion. Neck supple.  Cardiovascular: Normal rate and regular rhythm.   Musculoskeletal:       Hands: Neurological: He is alert and oriented to person, place, and time.    Urgent Care Course     Procedures (including critical care time)  Labs Review Labs Reviewed - No data to display  Imaging Review Dg Finger Middle Right  Result Date: 10/07/2016 CLINICAL DATA:  Finger  laceration this morning. Injury to the region of the distal EXAM: RIGHT MIDDLE FINGER 2+V COMPARISON:  Down FINDINGS: There is no evidence of fracture or dislocation. There is no evidence of arthropathy or other focal bone abnormality. Soft tissue defect noted distal finger. No evidence for underlying retained radiopaque soft tissue foreign body. IMPRESSION: No acute bony findings. Electronically Signed   By: Misty Stanley M.D.   On: 10/07/2016 12:33     MDM   1. Laceration of right middle finger without foreign body without damage to nail, initial encounter    Discharge Medication List as of 10/07/2016 12:52 PM    START taking these medications   Details  cephALEXin (KEFLEX) 250 MG capsule Take 1 capsule (250 mg total) by mouth 4 (four) times daily., Starting Thu 10/07/2016, Normal    mupirocin cream (BACTROBAN) 2 % Apply 1 application topically 2 (two) times daily., Starting Thu 10/07/2016, Normal       Patient presents with laceration to the right middle finger pad after cutting with a table saw. Wound is superficial and jagged. No real site noted that would be adequate for sutures. No bleeding at time of exam. Recommend dressing changes twice a day and keeping clean with warm water. Instructions given. I'll give a seven-day course of Keflex as well as Bactroban to place during dressing changes. Patient reports doesn't know exact date but believes his tetanus shot is within the last 5 years. Patient advised return should he develop any signs of infection.  Luvenia Redden, PA-C      Luvenia Redden, PA-C 10/07/16 1300

## 2016-10-07 NOTE — Discharge Instructions (Signed)
-  Keflex: every six hours for 7 days -Bactroban: apply twice a day with bandage change -changed bandages twice a day. Keep clean with warm soapy water. -Tylenol for ibuprofen for pain -return to clinic if have signs of infection.

## 2016-10-27 ENCOUNTER — Ambulatory Visit (INDEPENDENT_AMBULATORY_CARE_PROVIDER_SITE_OTHER): Payer: Medicare Other | Admitting: Internal Medicine

## 2016-10-27 ENCOUNTER — Encounter: Payer: Self-pay | Admitting: Internal Medicine

## 2016-10-27 VITALS — BP 126/72 | HR 81 | Temp 98.6°F | Resp 14 | Ht 70.0 in | Wt 178.0 lb

## 2016-10-27 DIAGNOSIS — E7800 Pure hypercholesterolemia, unspecified: Secondary | ICD-10-CM

## 2016-10-27 DIAGNOSIS — R739 Hyperglycemia, unspecified: Secondary | ICD-10-CM | POA: Diagnosis not present

## 2016-10-27 DIAGNOSIS — E041 Nontoxic single thyroid nodule: Secondary | ICD-10-CM

## 2016-10-27 DIAGNOSIS — Z23 Encounter for immunization: Secondary | ICD-10-CM

## 2016-10-27 DIAGNOSIS — I10 Essential (primary) hypertension: Secondary | ICD-10-CM | POA: Diagnosis not present

## 2016-10-27 DIAGNOSIS — I2510 Atherosclerotic heart disease of native coronary artery without angina pectoris: Secondary | ICD-10-CM

## 2016-10-27 DIAGNOSIS — R972 Elevated prostate specific antigen [PSA]: Secondary | ICD-10-CM

## 2016-10-27 DIAGNOSIS — I700 Atherosclerosis of aorta: Secondary | ICD-10-CM

## 2016-10-27 DIAGNOSIS — I723 Aneurysm of iliac artery: Secondary | ICD-10-CM

## 2016-10-27 LAB — CBC WITH DIFFERENTIAL/PLATELET
BASOS PCT: 0.7 % (ref 0.0–3.0)
Basophils Absolute: 0.1 10*3/uL (ref 0.0–0.1)
EOS ABS: 0.2 10*3/uL (ref 0.0–0.7)
Eosinophils Relative: 2.4 % (ref 0.0–5.0)
HEMATOCRIT: 42.9 % (ref 39.0–52.0)
HEMOGLOBIN: 14.3 g/dL (ref 13.0–17.0)
LYMPHS PCT: 30.5 % (ref 12.0–46.0)
Lymphs Abs: 2.3 10*3/uL (ref 0.7–4.0)
MCHC: 33.3 g/dL (ref 30.0–36.0)
MCV: 90.8 fl (ref 78.0–100.0)
MONOS PCT: 8.5 % (ref 3.0–12.0)
Monocytes Absolute: 0.6 10*3/uL (ref 0.1–1.0)
Neutro Abs: 4.3 10*3/uL (ref 1.4–7.7)
Neutrophils Relative %: 57.9 % (ref 43.0–77.0)
Platelets: 174 10*3/uL (ref 150.0–400.0)
RBC: 4.72 Mil/uL (ref 4.22–5.81)
RDW: 13.3 % (ref 11.5–15.5)
WBC: 7.4 10*3/uL (ref 4.0–10.5)

## 2016-10-27 LAB — HEPATIC FUNCTION PANEL
ALBUMIN: 4.4 g/dL (ref 3.5–5.2)
ALT: 15 U/L (ref 0–53)
AST: 17 U/L (ref 0–37)
Alkaline Phosphatase: 51 U/L (ref 39–117)
BILIRUBIN TOTAL: 0.7 mg/dL (ref 0.2–1.2)
Bilirubin, Direct: 0.1 mg/dL (ref 0.0–0.3)
TOTAL PROTEIN: 7.2 g/dL (ref 6.0–8.3)

## 2016-10-27 LAB — LIPID PANEL
CHOLESTEROL: 152 mg/dL (ref 0–200)
HDL: 55.3 mg/dL (ref >39.00)
LDL CALC: 83 mg/dL (ref 0–99)
NonHDL: 96.53
Total CHOL/HDL Ratio: 3
Triglycerides: 66 mg/dL (ref 0.0–149.0)
VLDL: 13.2 mg/dL (ref 0.0–40.0)

## 2016-10-27 LAB — BASIC METABOLIC PANEL
BUN: 20 mg/dL (ref 6–23)
CHLORIDE: 105 meq/L (ref 96–112)
CO2: 28 mEq/L (ref 19–32)
Calcium: 9.9 mg/dL (ref 8.4–10.5)
Creatinine, Ser: 1.11 mg/dL (ref 0.40–1.50)
GFR: 69.18 mL/min (ref >60.00)
GLUCOSE: 96 mg/dL (ref 70–99)
POTASSIUM: 4.4 meq/L (ref 3.5–5.1)
Sodium: 139 mEq/L (ref 135–145)

## 2016-10-27 LAB — TSH: TSH: 1.09 u[IU]/mL (ref 0.35–4.50)

## 2016-10-27 LAB — HEMOGLOBIN A1C: Hgb A1c MFr Bld: 5.8 % (ref 4.6–6.5)

## 2016-10-27 MED ORDER — TETANUS-DIPHTH-ACELL PERTUSSIS 5-2.5-18.5 LF-MCG/0.5 IM SUSP: 0.5000 mL | Freq: Once | INTRAMUSCULAR | 0 refills | Status: AC

## 2016-10-27 MED ORDER — ZOSTER VAC RECOMB ADJUVANTED 50 MCG/0.5ML IM SUSR: 0.5000 mL | Freq: Once | INTRAMUSCULAR | 0 refills | Status: AC

## 2016-10-27 NOTE — Assessment & Plan Note (Signed)
On lipitor.  Low cholesterol diet and continue exercise.  Follow lipid panel and liver function tests.   Lab Results  Component Value Date   CHOL 152 10/27/2016   HDL 55.30 10/27/2016   LDLCALC 83 10/27/2016   TRIG 66.0 10/27/2016   CHOLHDL 3 10/27/2016

## 2016-10-27 NOTE — Assessment & Plan Note (Signed)
On finasteride.  Seeing Dr Jacqlyn Larsen.  Last psa stable. Note reviewed.  Continue f/u with Dr Jacqlyn Larsen.

## 2016-10-27 NOTE — Assessment & Plan Note (Signed)
Low carb diet and exercise.  Follow met b and a1c.   

## 2016-10-27 NOTE — Assessment & Plan Note (Signed)
Continue lipitor  ?

## 2016-10-27 NOTE — Assessment & Plan Note (Signed)
Continue risk factor modification.  Tolerating low dose atorvastatin.  Had increased LFTs previously.  Recheck today.  Currently doing well.  Asymptomatic.

## 2016-10-27 NOTE — Assessment & Plan Note (Signed)
Seeing vascular surgery.  Last evaluation - stable.  Recommended continuing to monitor on an annual basis.

## 2016-10-27 NOTE — Assessment & Plan Note (Signed)
Worked up by Dr Eddie Dibbles.  Previous biopsy negative.  Last evaluated 12/2015.  Had f/u ultrasound.  Stable.  Recommended f/u prn.

## 2016-10-27 NOTE — Assessment & Plan Note (Signed)
Blood pressure under good control.  Continue same medication regimen.  Follow pressures.  Follow metabolic panel.   

## 2016-10-27 NOTE — Progress Notes (Signed)
Pre-visit discussion using our clinic review tool. No additional management support is needed unless otherwise documented below in the visit note.  

## 2016-10-27 NOTE — Progress Notes (Signed)
Patient ID: Nathan Watts, male   DOB: 1946/04/05, 71 y.o.   MRN: [id number]   Subjective:    Patient ID: Nathan Watts, male    DOB: December 31, 1945, 71 y.o.   MRN: [id number]  HPI  Patient here for a scheduled follow up.  He reports he is doing well. Staying active.  Exercising.  No chest pain.  No sob.  No acid reflux.  No abdominal pain.  Bowels moving.  Blood pressures doing well.  No headache.  No dizziness or light headedness.  Overall feels good.     Past Medical History:  Diagnosis Date  . Anemia, unspecified   . GERD (gastroesophageal reflux disease)   . Hypercholesterolemia   . Hyperglycemia   . Hypertension   . Thrombocytopenia ([place])   . Thyroid nodule    Past Surgical History:  Procedure Laterality Date  . COLONOSCOPY WITH PROPOFOL N/A 10/02/2014   Procedure: COLONOSCOPY WITH PROPOFOL;  Surgeon: Manya Silvas, MD;  Location: [facility] ENDOSCOPY;  Service: Endoscopy;  Laterality: N/A;  . INGUINAL HERNIA REPAIR    . TONSILLECTOMY     Family History  Problem Relation Age of Onset  . Prostate cancer Father   . Heart disease Father        s/p CABG  . Stroke Mother   . Colon cancer Neg Hx    Social History   Social History  . Marital status: Married    Spouse name: N/A  . Number of children: 4  . Years of education: N/A   Occupational History  . retired    Social History Main Topics  . Smoking status: Former Smoker    Packs/day: 1.00    Years: 30.00    Types: Cigarettes    Quit date: 04/12/1998  . Smokeless tobacco: Former Systems developer    Quit date: 04/12/1999  . Alcohol use None  . Drug use: Unknown  . Sexual activity: No   Other Topics Concern  . None   Social History Narrative   He is married. Has four children    Outpatient Encounter Prescriptions as of 10/27/2016  Medication Sig  . aspirin 81 MG tablet Take 81 mg by mouth daily.  Marland Kitchen atorvastatin (LIPITOR) 10 MG tablet Take 1 tablet (10 mg total) by mouth daily.  . cyanocobalamin 500 MCG tablet Take 500 mcg by  mouth daily.  . finasteride (PROSCAR) 5 MG tablet   . Lactobacillus (PROBIOTIC ACIDOPHILUS PO) Take by mouth daily.  . Omega-3 Fatty Acids (FISH OIL) 1200 MG CAPS Take 1 capsule by mouth daily.  . Omeprazole Magnesium (PRILOSEC OTC PO) Take by mouth daily.  . Multiple Vitamin (MULTI VITAMIN MENS PO) Take 1 tablet by mouth daily.  . Tdap (BOOSTRIX) 5-2.5-18.5 LF-MCG/0.5 injection Inject 0.5 mLs into the muscle once.  . Zoster Vac Recomb Adjuvanted [facility]) injection Inject 0.5 mLs into the muscle once.  . [DISCONTINUED] cephALEXin (KEFLEX) 250 MG capsule Take 1 capsule (250 mg total) by mouth 4 (four) times daily.  . [DISCONTINUED] mupirocin cream (BACTROBAN) 2 % Apply 1 application topically 2 (two) times daily.  . [DISCONTINUED] mupirocin ointment (BACTROBAN) 2 % Apply 1 application topically daily.  . [DISCONTINUED] triamcinolone cream (KENALOG) 0.1 % Apply to affected area daily.   No facility-administered encounter medications on file as of 10/27/2016.     Review of Systems  Constitutional: Negative for appetite change and unexpected weight change.  HENT: Negative for congestion and sinus pressure.   Respiratory: Negative for cough, chest tightness and shortness of  breath.   Cardiovascular: Negative for chest pain, palpitations and leg swelling.  Gastrointestinal: Negative for abdominal pain, diarrhea, nausea and vomiting.  Genitourinary: Negative for difficulty urinating and dysuria.  Musculoskeletal: Negative for back pain and joint swelling.  Skin: Negative for color change and rash.  Neurological: Negative for dizziness, light-headedness and headaches.  Psychiatric/Behavioral: Negative for agitation and dysphoric mood.       Objective:    Physical Exam  Constitutional: He appears well-developed and well-nourished. No distress.  HENT:  Nose: Nose normal.  Mouth/Throat: Oropharynx is clear and moist.  Neck: Neck supple.  Increased thyroid fullness - right lobe thyroid.     Cardiovascular: Normal rate and regular rhythm.   Pulmonary/Chest: Effort normal and breath sounds normal. No respiratory distress.  Abdominal: Soft. Bowel sounds are normal. There is no tenderness.  Musculoskeletal: He exhibits no edema or tenderness.  Skin: No rash noted. No erythema.  Psychiatric: He has a normal mood and affect. His behavior is normal.    BP 126/72 (BP Location: Left Arm, Patient Position: Sitting, Cuff Size: Normal)   Pulse 81   Temp 98.6 F (37 C) (Oral)   Resp 14   Ht _0  (1.778 m)   Wt 178 lb (80.7 kg)   SpO2 96%   BMI 25.54 kg/m  Wt Readings from Last 3 Encounters:  10/27/16 178 lb (80.7 kg)  10/07/16 183 lb (83 kg)  08/17/16 180 lb 2 oz (81.7 kg)     Lab Results  Component Value Date   WBC 7.4 10/27/2016   HGB 14.3 10/27/2016   HCT 42.9 10/27/2016   PLT 174.0 10/27/2016   GLUCOSE 96 10/27/2016   CHOL 152 10/27/2016   TRIG 66.0 10/27/2016   HDL 55.30 10/27/2016   LDLCALC 83 10/27/2016   ALT 15 10/27/2016   AST 17 10/27/2016   NA 139 10/27/2016   K 4.4 10/27/2016   CL 105 10/27/2016   CREATININE 1.11 10/27/2016   BUN 20 10/27/2016   CO2 28 10/27/2016   TSH 1.09 10/27/2016   PSA 1.84 04/14/2016   HGBA1C 5.8 10/27/2016    Dg Finger Middle Right  Result Date: 10/07/2016 CLINICAL DATA:  Finger laceration this morning. Injury to the region of the distal EXAM: RIGHT MIDDLE FINGER 2+V COMPARISON:  Down FINDINGS: There is no evidence of fracture or dislocation. There is no evidence of arthropathy or other focal bone abnormality. Soft tissue defect noted distal finger. No evidence for underlying retained radiopaque soft tissue foreign body. IMPRESSION: No acute bony findings. Electronically Signed   By: Misty Stanley M.D.   On: 10/07/2016 12:33       Assessment & Plan:   Problem List Items Addressed This Visit    Aortic atherosclerosis ([place])    Continue lipitor.        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  Last  evaluation - stable.  Recommended continuing to monitor on an annual basis.        Coronary artery disease    Continue risk factor modification.  Tolerating low dose atorvastatin.  Had increased LFTs previously.  Recheck today.  Currently doing well.  Asymptomatic.        Elevated PSA    On finasteride.  Seeing Dr Jacqlyn Larsen.  Last psa stable. Note reviewed.  Continue f/u with Dr Jacqlyn Larsen.       Hypercholesterolemia    On lipitor.  Low cholesterol diet and continue exercise.  Follow lipid panel and liver  function tests.   Lab Results  Component Value Date   CHOL 152 10/27/2016   HDL 55.30 10/27/2016   LDLCALC 83 10/27/2016   TRIG 66.0 10/27/2016   CHOLHDL 3 10/27/2016        Relevant Orders   Hepatic function panel (Completed)   Lipid panel (Completed)   Hyperglycemia    Low carb diet and exercise.  Follow met b and a1c.        Relevant Orders   Hemoglobin A1c (Completed)   Hypertension, essential    Blood pressure under good control.  Continue same medication regimen.  Follow pressures.  Follow metabolic panel.        Relevant Orders   CBC with Differential/Platelet (Completed)   Basic metabolic panel (Completed)   Thyroid nodule    Worked up by Dr Eddie Dibbles.  Previous biopsy negative.  Last evaluated 12/2015.  Had f/u ultrasound.  Stable.  Recommended f/u prn.        Relevant Orders   TSH (Completed)    Other Visit Diagnoses    Need for shingles vaccine    -  Primary   Relevant Medications   Zoster Vac Recomb Adjuvanted [facility]) injection   Need for Tdap vaccination           Einar Pheasant, MD

## 2016-10-28 ENCOUNTER — Encounter: Payer: Self-pay | Admitting: Internal Medicine

## 2017-02-04 ENCOUNTER — Ambulatory Visit (INDEPENDENT_AMBULATORY_CARE_PROVIDER_SITE_OTHER): Payer: Medicare Other

## 2017-02-04 DIAGNOSIS — Z23 Encounter for immunization: Secondary | ICD-10-CM

## 2017-03-24 ENCOUNTER — Other Ambulatory Visit: Payer: Self-pay | Admitting: Internal Medicine

## 2017-03-24 DIAGNOSIS — R972 Elevated prostate specific antigen [PSA]: Secondary | ICD-10-CM | POA: Diagnosis not present

## 2017-03-24 DIAGNOSIS — N138 Other obstructive and reflux uropathy: Secondary | ICD-10-CM | POA: Diagnosis not present

## 2017-03-24 DIAGNOSIS — N401 Enlarged prostate with lower urinary tract symptoms: Secondary | ICD-10-CM | POA: Diagnosis not present

## 2017-03-24 DIAGNOSIS — R339 Retention of urine, unspecified: Secondary | ICD-10-CM | POA: Diagnosis not present

## 2017-05-03 ENCOUNTER — Ambulatory Visit (INDEPENDENT_AMBULATORY_CARE_PROVIDER_SITE_OTHER): Payer: Medicare Other | Admitting: Internal Medicine

## 2017-05-03 ENCOUNTER — Encounter: Payer: Self-pay | Admitting: Internal Medicine

## 2017-05-03 VITALS — BP 114/78 | HR 53 | Temp 97.5°F | Resp 16 | Wt 188.0 lb

## 2017-05-03 DIAGNOSIS — Z0000 Encounter for general adult medical examination without abnormal findings: Secondary | ICD-10-CM

## 2017-05-03 DIAGNOSIS — I700 Atherosclerosis of aorta: Secondary | ICD-10-CM | POA: Diagnosis not present

## 2017-05-03 DIAGNOSIS — R972 Elevated prostate specific antigen [PSA]: Secondary | ICD-10-CM

## 2017-05-03 DIAGNOSIS — M79644 Pain in right finger(s): Secondary | ICD-10-CM | POA: Diagnosis not present

## 2017-05-03 DIAGNOSIS — I723 Aneurysm of iliac artery: Secondary | ICD-10-CM | POA: Diagnosis not present

## 2017-05-03 DIAGNOSIS — I10 Essential (primary) hypertension: Secondary | ICD-10-CM

## 2017-05-03 DIAGNOSIS — I2510 Atherosclerotic heart disease of native coronary artery without angina pectoris: Secondary | ICD-10-CM

## 2017-05-03 DIAGNOSIS — R739 Hyperglycemia, unspecified: Secondary | ICD-10-CM

## 2017-05-03 DIAGNOSIS — E7800 Pure hypercholesterolemia, unspecified: Secondary | ICD-10-CM

## 2017-05-03 DIAGNOSIS — E041 Nontoxic single thyroid nodule: Secondary | ICD-10-CM | POA: Diagnosis not present

## 2017-05-03 LAB — HEPATIC FUNCTION PANEL
ALK PHOS: 64 U/L (ref 39–117)
ALT: 14 U/L (ref 0–53)
AST: 18 U/L (ref 0–37)
Albumin: 4.4 g/dL (ref 3.5–5.2)
Bilirubin, Direct: 0.1 mg/dL (ref 0.0–0.3)
TOTAL PROTEIN: 7.4 g/dL (ref 6.0–8.3)
Total Bilirubin: 0.7 mg/dL (ref 0.2–1.2)

## 2017-05-03 LAB — LIPID PANEL
CHOLESTEROL: 158 mg/dL (ref 0–200)
HDL: 44.1 mg/dL (ref >39.00)
LDL Cholesterol: 95 mg/dL (ref 0–99)
NonHDL: 113.95
TRIGLYCERIDES: 94 mg/dL (ref 0.0–149.0)
Total CHOL/HDL Ratio: 4
VLDL: 18.8 mg/dL (ref 0.0–40.0)

## 2017-05-03 LAB — TSH: TSH: 0.95 u[IU]/mL (ref 0.35–4.50)

## 2017-05-03 LAB — BASIC METABOLIC PANEL
BUN: 13 mg/dL (ref 6–23)
CALCIUM: 9.5 mg/dL (ref 8.4–10.5)
CO2: 32 mEq/L (ref 19–32)
CREATININE: 1.01 mg/dL (ref 0.40–1.50)
Chloride: 101 mEq/L (ref 96–112)
GFR: 77.04 mL/min (ref >60.00)
Glucose, Bld: 99 mg/dL (ref 70–99)
POTASSIUM: 4.7 meq/L (ref 3.5–5.1)
Sodium: 136 mEq/L (ref 135–145)

## 2017-05-03 LAB — HEMOGLOBIN A1C: Hgb A1c MFr Bld: 5.8 % (ref 4.6–6.5)

## 2017-05-03 NOTE — Assessment & Plan Note (Signed)
Physical today 05/03/17.  psa and prostate followed by Dr Jacqlyn Larsen.  Colonoscopy 09/2014 - internal hemorrhoids - otherwise normal.  Recommended f/u in 10 years.

## 2017-05-03 NOTE — Progress Notes (Signed)
Patient ID: Nathan Watts, male   DOB: 01-27-1946, 72 y.o.   MRN: [id number]   Subjective:    Patient ID: Nathan Watts, male    DOB: 1946-02-28, 72 y.o.   MRN: [id number]  HPI  Patient here for his physical exam.  States he is doing well.  Feels good.  Has had some nasal congestion and drainage.  Took mucinex and using nasal spray.  Is better.  Feels better.  No chest congestion.  No chest pain.  Breathing stable.  No acid reflux.  No abdominal pain.  Bowels moving.  Some pain - right thumb.  Request ortho referral.  Seeing urology. On finasteride.  Dong well.  Not drinking.    Past Medical History:  Diagnosis Date  . Anemia, unspecified   . GERD (gastroesophageal reflux disease)   . Hypercholesterolemia   . Hyperglycemia   . Hypertension   . Thrombocytopenia ([place])   . Thyroid nodule    Past Surgical History:  Procedure Laterality Date  . COLONOSCOPY WITH PROPOFOL N/A 10/02/2014   Procedure: COLONOSCOPY WITH PROPOFOL;  Surgeon: Manya Silvas, MD;  Location: [facility] ENDOSCOPY;  Service: Endoscopy;  Laterality: N/A;  . INGUINAL HERNIA REPAIR    . TONSILLECTOMY     Family History  Problem Relation Age of Onset  . Prostate cancer Father   . Heart disease Father        s/p CABG  . Stroke Mother   . Colon cancer Neg Hx    Social History   Socioeconomic History  . Marital status: Married    Spouse name: None  . Number of children: 4  . Years of education: None  . Highest education level: None  Social Needs  . Financial resource strain: None  . Food insecurity - worry: None  . Food insecurity - inability: None  . Transportation needs - medical: None  . Transportation needs - non-medical: None  Occupational History  . Occupation: retired  Tobacco Use  . Smoking status: Former Smoker    Packs/day: 1.00    Years: 30.00    Pack years: 30.00    Types: Cigarettes    Last attempt to quit: 04/12/1998    Years since quitting: 19.0  . Smokeless tobacco: Former Systems developer    Quit date:  04/12/1999  Substance and Sexual Activity  . Alcohol use: None  . Drug use: None  . Sexual activity: No  Other Topics Concern  . None  Social History Narrative   He is married. Has four children    Outpatient Encounter Medications as of 05/03/2017  Medication Sig  . aspirin 81 MG tablet Take 81 mg by mouth daily.  Marland Kitchen atorvastatin (LIPITOR) 10 MG tablet Take 1 tablet (10 mg total) by mouth daily.  Marland Kitchen atorvastatin (LIPITOR) 10 MG tablet TAKE ONE TABLET BY MOUTH EVERY DAY  . cyanocobalamin 500 MCG tablet Take 500 mcg by mouth daily.  . finasteride (PROSCAR) 5 MG tablet   . Lactobacillus (PROBIOTIC ACIDOPHILUS PO) Take by mouth daily.  . Multiple Vitamin (MULTI VITAMIN MENS PO) Take 1 tablet by mouth daily.  . Omega-3 Fatty Acids (FISH OIL) 1200 MG CAPS Take 1 capsule by mouth daily.  . Omeprazole Magnesium (PRILOSEC OTC PO) Take by mouth daily.   No facility-administered encounter medications on file as of 05/03/2017.     Review of Systems  Constitutional: Negative for appetite change and unexpected weight change.  HENT: Negative for congestion.        Previous post nasal  drainage and congestion.   Eyes: Negative for pain and visual disturbance.  Respiratory: Negative for cough, chest tightness and shortness of breath.   Cardiovascular: Negative for chest pain, palpitations and leg swelling.  Gastrointestinal: Negative for abdominal pain, diarrhea, nausea and vomiting.  Genitourinary: Negative for difficulty urinating and dysuria.  Musculoskeletal: Negative for back pain and myalgias.  Skin: Negative for color change and rash.  Neurological: Negative for dizziness, light-headedness and headaches.  Hematological: Negative for adenopathy. Does not bruise/bleed easily.  Psychiatric/Behavioral: Negative for agitation and dysphoric mood.       Objective:     Blood pressure rechecked by me:  124/78  Physical Exam  Constitutional: He is oriented to person, place, and time. He  appears well-developed and well-nourished. No distress.  HENT:  Head: Normocephalic and atraumatic.  Nose: Nose normal.  Mouth/Throat: Oropharynx is clear and moist. No oropharyngeal exudate.  Eyes: Conjunctivae are normal. Right eye exhibits no discharge. Left eye exhibits no discharge.  Neck: Neck supple. No thyromegaly present.  Cardiovascular: Normal rate and regular rhythm.  Pulmonary/Chest: Breath sounds normal. No respiratory distress. He has no wheezes.  Abdominal: Soft. Bowel sounds are normal. There is no tenderness.  Genitourinary:  Genitourinary Comments: Sees urology.   Musculoskeletal: He exhibits no edema or tenderness.  Lymphadenopathy:    He has no cervical adenopathy.  Neurological: He is alert and oriented to person, place, and time.  Skin: Skin is warm and dry. No rash noted. No erythema.  Psychiatric: He has a normal mood and affect. His behavior is normal.    BP 114/78 (BP Location: Left Arm, Patient Position: Sitting, Cuff Size: Large)   Pulse (!) 53   Temp (!) 97.5 F (36.4 C) (Oral)   Resp 16   Wt 188 lb (85.3 kg)   SpO2 96%   BMI 26.98 kg/m  Wt Readings from Last 3 Encounters:  05/03/17 188 lb (85.3 kg)  10/27/16 178 lb (80.7 kg)  10/07/16 183 lb (83 kg)     Lab Results  Component Value Date   WBC 7.4 10/27/2016   HGB 14.3 10/27/2016   HCT 42.9 10/27/2016   PLT 174.0 10/27/2016   GLUCOSE 99 05/03/2017   CHOL 158 05/03/2017   TRIG 94.0 05/03/2017   HDL 44.10 05/03/2017   LDLCALC 95 05/03/2017   ALT 14 05/03/2017   AST 18 05/03/2017   NA 136 05/03/2017   K 4.7 05/03/2017   CL 101 05/03/2017   CREATININE 1.01 05/03/2017   BUN 13 05/03/2017   CO2 32 05/03/2017   TSH 0.95 05/03/2017   PSA 1.84 04/14/2016   HGBA1C 5.8 05/03/2017    Dg Finger Middle Right  Result Date: 10/07/2016 CLINICAL DATA:  Finger laceration this morning. Injury to the region of the distal EXAM: RIGHT MIDDLE FINGER 2+V COMPARISON:  Down FINDINGS: There is no  evidence of fracture or dislocation. There is no evidence of arthropathy or other focal bone abnormality. Soft tissue defect noted distal finger. No evidence for underlying retained radiopaque soft tissue foreign body. IMPRESSION: No acute bony findings. Electronically Signed   By: Misty Stanley M.D.   On: 10/07/2016 12:33       Assessment & Plan:   Problem List Items Addressed This Visit    Aortic atherosclerosis ([place])    Continue lipitor.       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  Last evaluation stable.  Recommended f/u in one year.  Coronary artery disease    Currently asymptomatic.  Continue risk factor modification.        Elevated prostate specific antigen (PSA)    Followed by urology.  On finasteride. Doing well.       Health care maintenance    Physical today 05/03/17.  psa and prostate followed by Dr Jacqlyn Larsen.  Colonoscopy 09/2014 - internal hemorrhoids - otherwise normal.  Recommended f/u in 10 years.        Hypercholesterolemia    Low cholesterol diet and exercise.  Follow lipid panel and liver function tests.        Relevant Orders   Lipid panel (Completed)   Hepatic function panel (Completed)   Hyperglycemia    Low carb diet and exercise.  Follow met b and a1c.       Relevant Orders   Hemoglobin A1c (Completed)   Hypertension, essential    Blood pressure under good control.  Continue same medication regimen.  Follow pressures.  Follow metabolic panel.        Relevant Orders   Basic metabolic panel (Completed)   Thyroid nodule   Relevant Orders   TSH (Completed)    Other Visit Diagnoses    Routine general medical examination at a health care facility    -  Primary   Pain of right thumb       persistent.  refer to ortho.    Relevant Orders   Ambulatory referral to Orthopedic Surgery       Einar Pheasant, MD

## 2017-05-06 ENCOUNTER — Encounter: Payer: Self-pay | Admitting: Internal Medicine

## 2017-05-06 NOTE — Assessment & Plan Note (Signed)
Low cholesterol diet and exercise.  Follow lipid panel and liver function tests.  

## 2017-05-06 NOTE — Assessment & Plan Note (Signed)
Continue lipitor  ?

## 2017-05-06 NOTE — Assessment & Plan Note (Signed)
Low carb diet and exercise.  Follow met b and a1c.  

## 2017-05-06 NOTE — Assessment & Plan Note (Signed)
Followed by urology.  On finasteride. Doing well.

## 2017-05-06 NOTE — Assessment & Plan Note (Signed)
Blood pressure under good control.  Continue same medication regimen.  Follow pressures.  Follow metabolic panel.   

## 2017-05-06 NOTE — Assessment & Plan Note (Signed)
Seeing vascular surgery.  Last evaluation stable.  Recommended f/u in one year.

## 2017-05-06 NOTE — Assessment & Plan Note (Signed)
Currently asymptomatic.  Continue risk factor modification.  

## 2017-05-18 DIAGNOSIS — M1811 Unilateral primary osteoarthritis of first carpometacarpal joint, right hand: Secondary | ICD-10-CM | POA: Insufficient documentation

## 2017-05-18 DIAGNOSIS — M79644 Pain in right finger(s): Secondary | ICD-10-CM | POA: Diagnosis not present

## 2017-06-17 DIAGNOSIS — Z85828 Personal history of other malignant neoplasm of skin: Secondary | ICD-10-CM | POA: Diagnosis not present

## 2017-06-17 DIAGNOSIS — Z08 Encounter for follow-up examination after completed treatment for malignant neoplasm: Secondary | ICD-10-CM | POA: Diagnosis not present

## 2017-06-17 DIAGNOSIS — L821 Other seborrheic keratosis: Secondary | ICD-10-CM | POA: Diagnosis not present

## 2017-06-17 DIAGNOSIS — L718 Other rosacea: Secondary | ICD-10-CM | POA: Diagnosis not present

## 2017-07-06 DIAGNOSIS — H2513 Age-related nuclear cataract, bilateral: Secondary | ICD-10-CM | POA: Diagnosis not present

## 2017-07-18 ENCOUNTER — Other Ambulatory Visit: Payer: Self-pay | Admitting: Internal Medicine

## 2017-08-09 ENCOUNTER — Ambulatory Visit (INDEPENDENT_AMBULATORY_CARE_PROVIDER_SITE_OTHER): Payer: Medicare Other | Admitting: Vascular Surgery

## 2017-08-09 ENCOUNTER — Encounter (INDEPENDENT_AMBULATORY_CARE_PROVIDER_SITE_OTHER): Payer: Self-pay | Admitting: Vascular Surgery

## 2017-08-09 ENCOUNTER — Ambulatory Visit (INDEPENDENT_AMBULATORY_CARE_PROVIDER_SITE_OTHER): Payer: Medicare Other

## 2017-08-09 VITALS — BP 125/78 | HR 50 | Resp 16 | Ht 71.0 in | Wt 191.4 lb

## 2017-08-09 DIAGNOSIS — I10 Essential (primary) hypertension: Secondary | ICD-10-CM

## 2017-08-09 DIAGNOSIS — I700 Atherosclerosis of aorta: Secondary | ICD-10-CM

## 2017-08-09 DIAGNOSIS — I723 Aneurysm of iliac artery: Secondary | ICD-10-CM

## 2017-08-09 NOTE — Progress Notes (Signed)
MRN : [id number]  Nathan Watts is a 72 y.o. (11-27-1945) male who presents with chief complaint of  Chief Complaint  Patient presents with  . Follow-up    42yr aorta iliac  .  History of Present Illness: Patient returns today in follow up of iliac artery aneurysms.  He is doing well.  He has no specific complaints today.  He denies any aneurysm related symptoms. Specifically, the patient denies new back or abdominal pain, or signs of peripheral embolization His duplex today shows an ectatic aorta without an aneurysm in stable sizes of his iliac arteries at 1.3 cm on the right and 1.5 cm left. This is stable from his study last year.   Current Outpatient Prescriptions  Medication Sig Dispense Refill  . aspirin 81 MG tablet Take 81 mg by mouth daily.    Marland Kitchen atorvastatin (LIPITOR) 10 MG tablet Take 1 tablet (10 mg total) by mouth daily. 90 tablet 1  . cyanocobalamin 500 MCG tablet Take 500 mcg by mouth daily.    . finasteride (PROSCAR) 5 MG tablet   3  . Lactobacillus (PROBIOTIC ACIDOPHILUS PO) Take by mouth daily.    . Multiple Vitamin (MULTI VITAMIN MENS PO) Take 1 tablet by mouth daily.    . Omega-3 Fatty Acids (FISH OIL) 1200 MG CAPS Take 1 capsule by mouth daily.    . Omeprazole Magnesium (PRILOSEC OTC PO) Take by mouth daily.     No current facility-administered medications for this visit.         Past Medical History:  Diagnosis Date  . Anemia, unspecified   . GERD (gastroesophageal reflux disease)   . Hypercholesterolemia   . Hyperglycemia   . Hypertension   . Thrombocytopenia ([place])   . Thyroid nodule          Past Surgical History:  Procedure Laterality Date  . COLONOSCOPY WITH PROPOFOL N/A 10/02/2014   Procedure: COLONOSCOPY WITH PROPOFOL;  Surgeon: Manya Silvas, MD;  Location: [facility] ENDOSCOPY;  Service: Endoscopy;  Laterality: N/A;  . INGUINAL HERNIA REPAIR    . TONSILLECTOMY      Social History      Social History    Substance Use Topics  . Smoking status: Former Smoker    Packs/day: 1.00    Years: 30.00    Types: Cigarettes    Quit date: 04/12/1998  . Smokeless tobacco: Never Used  . Alcohol use No     Family History       Family History  Problem Relation Age of Onset  . Prostate cancer Father   . Heart disease Father     s/p CABG  . Stroke Mother   . Colon cancer Neg Hx          Allergies  Allergen Reactions  . Hydrocodone-Chlorpheniramine Other (See Comments)    Unknown, possible causes anxiety  . Crestor [Rosuvastatin] Rash    Causes GI upset and anxiety     REVIEW OF SYSTEMS (Negative unless checked)  Constitutional: [] Weight loss  [] Fever  [] Chills Cardiac: [] Chest pain   [] Chest pressure   [] Palpitations   [] Shortness of breath when laying flat   [] Shortness of breath at rest   [] Shortness of breath with exertion. Vascular:  [] Pain in legs with walking   [] Pain in legs at rest   [] Pain in legs when laying flat   [] Claudication   [] Pain in feet when walking  [] Pain in feet at rest  [] Pain in feet when laying flat   []   History of DVT   [] Phlebitis   [] Swelling in legs   [] Varicose veins   [] Non-healing ulcers Pulmonary:   [] Uses home oxygen   [] Productive cough   [] Hemoptysis   [] Wheeze  [] COPD   [] Asthma Neurologic:  [] Dizziness  [] Blackouts   [] Seizures   [] History of stroke   [] History of TIA  [] Aphasia   [] Temporary blindness   [] Dysphagia   [] Weakness or numbness in arms   [] Weakness or numbness in legs Musculoskeletal:  [] Arthritis   [] Joint swelling   [] Joint pain   [] Low back pain Hematologic:  [] Easy bruising  [] Easy bleeding   [] Hypercoagulable state   [] Anemic   Gastrointestinal:  [] Blood in stool   [] Vomiting blood  [] Gastroesophageal reflux/heartburn   [] Abdominal pain Genitourinary:  [] Chronic kidney disease   [] Difficult urination  [] Frequent urination  [] Burning with urination   [] Hematuria Skin:  [] Rashes   [] Ulcers   [] Wounds Psychological:   [] History of anxiety   []  History of major depression.    Physical Examination  BP 125/78 (BP Location: Left Arm)   Pulse (!) 50   Resp 16   Ht 5\' 11"  (1.803 m)   Wt 86.8 kg (191 lb 6.4 oz)   BMI 26.69 kg/m  Gen:  WD/WN, NAD Head: /AT, No temporalis wasting. Ear/Nose/Throat: Hearing grossly intact, nares w/o erythema or drainage Eyes: Conjunctiva clear. Sclera non-icteric Neck: Supple.  Trachea midline Pulmonary:  Good air movement, no use of accessory muscles.  Cardiac: RRR, no JVD Vascular:  Vessel Right Left  Radial Palpable Palpable                          PT Palpable Palpable  DP Palpable Palpable   Gastrointestinal: soft, non-tender/non-distended. No guarding/reflex.  No increased aortic impulse Musculoskeletal: M/S 5/5 throughout.  No deformity or atrophy.  Neurologic: Sensation grossly intact in extremities.  Symmetrical.  Speech is fluent.  Psychiatric: Judgment intact, Mood & affect appropriate for pt's clinical situation. Dermatologic: No rashes or ulcers noted.  No cellulitis or open wounds.       Labs No results found for this or any previous visit (from the past 2160 hour(s)).  Radiology No results found.  Assessment/Plan Hypercholesterolemia lipid control important in reducing the progression of atherosclerotic disease. Continue statin therapy   Aortic atherosclerosis ([facility]) His duplex today shows an ectatic aorta without an aneurysm in stable sizes of his iliac arteries at 1.3 cm on the right and 1.5 cm left.  Common iliac aneurysm ([facility]) His duplex today shows an ectatic aorta without an aneurysm in stable sizes of his iliac arteries at 1.3 cm on the right and 1.5 cm left. This is stable from his study last year. This is well below the size for prophylactic repair. We will continue to monitor this on an annual basis. He should remain abstinent from tobacco and ensure that his blood pressure remains generally in a normal  range.      Leotis Pain, MD  08/09/2017 8:41 AM    This note was created with Dragon medical transcription system.  Any errors from dictation are purely unintentional

## 2017-08-11 DIAGNOSIS — S2242XA Multiple fractures of ribs, left side, initial encounter for closed fracture: Secondary | ICD-10-CM | POA: Diagnosis not present

## 2017-08-11 DIAGNOSIS — R0781 Pleurodynia: Secondary | ICD-10-CM | POA: Diagnosis not present

## 2017-08-11 DIAGNOSIS — W01198A Fall on same level from slipping, tripping and stumbling with subsequent striking against other object, initial encounter: Secondary | ICD-10-CM | POA: Diagnosis not present

## 2017-10-22 ENCOUNTER — Other Ambulatory Visit: Payer: Self-pay | Admitting: Internal Medicine

## 2017-10-31 ENCOUNTER — Ambulatory Visit: Payer: Medicare Other | Admitting: Internal Medicine

## 2017-10-31 ENCOUNTER — Encounter: Payer: Self-pay | Admitting: Internal Medicine

## 2017-10-31 VITALS — BP 128/84 | HR 51 | Temp 98.1°F | Resp 18 | Wt 183.6 lb

## 2017-10-31 DIAGNOSIS — Z0000 Encounter for general adult medical examination without abnormal findings: Secondary | ICD-10-CM | POA: Diagnosis not present

## 2017-10-31 DIAGNOSIS — R739 Hyperglycemia, unspecified: Secondary | ICD-10-CM | POA: Diagnosis not present

## 2017-10-31 DIAGNOSIS — E7800 Pure hypercholesterolemia, unspecified: Secondary | ICD-10-CM

## 2017-10-31 DIAGNOSIS — I2510 Atherosclerotic heart disease of native coronary artery without angina pectoris: Secondary | ICD-10-CM | POA: Diagnosis not present

## 2017-10-31 DIAGNOSIS — E041 Nontoxic single thyroid nodule: Secondary | ICD-10-CM

## 2017-10-31 DIAGNOSIS — I723 Aneurysm of iliac artery: Secondary | ICD-10-CM | POA: Diagnosis not present

## 2017-10-31 DIAGNOSIS — R972 Elevated prostate specific antigen [PSA]: Secondary | ICD-10-CM

## 2017-10-31 DIAGNOSIS — R0781 Pleurodynia: Secondary | ICD-10-CM | POA: Insufficient documentation

## 2017-10-31 DIAGNOSIS — I10 Essential (primary) hypertension: Secondary | ICD-10-CM | POA: Diagnosis not present

## 2017-10-31 DIAGNOSIS — I700 Atherosclerosis of aorta: Secondary | ICD-10-CM | POA: Diagnosis not present

## 2017-10-31 LAB — LIPID PANEL
Cholesterol: 158 mg/dL (ref 0–200)
HDL: 48.9 mg/dL (ref >39.00)
LDL Cholesterol: 92 mg/dL (ref 0–99)
NONHDL: 109.41
TRIGLYCERIDES: 85 mg/dL (ref 0.0–149.0)
Total CHOL/HDL Ratio: 3
VLDL: 17 mg/dL (ref 0.0–40.0)

## 2017-10-31 LAB — CBC WITH DIFFERENTIAL/PLATELET
BASOS ABS: 0.1 10*3/uL (ref 0.0–0.1)
Basophils Relative: 0.7 % (ref 0.0–3.0)
EOS PCT: 2.5 % (ref 0.0–5.0)
Eosinophils Absolute: 0.2 10*3/uL (ref 0.0–0.7)
HEMATOCRIT: 39.9 % (ref 39.0–52.0)
Hemoglobin: 13.5 g/dL (ref 13.0–17.0)
LYMPHS ABS: 1.7 10*3/uL (ref 0.7–4.0)
LYMPHS PCT: 24 % (ref 12.0–46.0)
MCHC: 33.8 g/dL (ref 30.0–36.0)
MCV: 89.4 fl (ref 78.0–100.0)
MONOS PCT: 8.3 % (ref 3.0–12.0)
Monocytes Absolute: 0.6 10*3/uL (ref 0.1–1.0)
NEUTROS ABS: 4.5 10*3/uL (ref 1.4–7.7)
Neutrophils Relative %: 64.5 % (ref 43.0–77.0)
PLATELETS: 162 10*3/uL (ref 150.0–400.0)
RBC: 4.46 Mil/uL (ref 4.22–5.81)
RDW: 14.2 % (ref 11.5–15.5)
WBC: 6.9 10*3/uL (ref 4.0–10.5)

## 2017-10-31 LAB — PSA, MEDICARE: PSA: 1.87 ng/mL (ref 0.10–4.00)

## 2017-10-31 LAB — BASIC METABOLIC PANEL
BUN: 17 mg/dL (ref 6–23)
CHLORIDE: 104 meq/L (ref 96–112)
CO2: 29 mEq/L (ref 19–32)
Calcium: 9.2 mg/dL (ref 8.4–10.5)
Creatinine, Ser: 1.08 mg/dL (ref 0.40–1.50)
GFR: 71.21 mL/min (ref >60.00)
Glucose, Bld: 98 mg/dL (ref 70–99)
POTASSIUM: 4.8 meq/L (ref 3.5–5.1)
SODIUM: 139 meq/L (ref 135–145)

## 2017-10-31 LAB — HEPATIC FUNCTION PANEL
ALBUMIN: 4.1 g/dL (ref 3.5–5.2)
ALT: 12 U/L (ref 0–53)
AST: 13 U/L (ref 0–37)
Alkaline Phosphatase: 72 U/L (ref 39–117)
BILIRUBIN DIRECT: 0.1 mg/dL (ref 0.0–0.3)
TOTAL PROTEIN: 7.1 g/dL (ref 6.0–8.3)
Total Bilirubin: 0.6 mg/dL (ref 0.2–1.2)

## 2017-10-31 LAB — HEMOGLOBIN A1C: Hgb A1c MFr Bld: 5.9 % (ref 4.6–6.5)

## 2017-10-31 LAB — TSH: TSH: 1.31 u[IU]/mL (ref 0.35–4.50)

## 2017-10-31 MED ORDER — NYSTATIN 100000 UNIT/GM EX CREA: 1.0000 "application " | TOPICAL_CREAM | Freq: Two times a day (BID) | CUTANEOUS | 0 refills | Status: DC

## 2017-10-31 NOTE — Assessment & Plan Note (Signed)
Last physical 05/03/17.  Will schedule physical when due.

## 2017-10-31 NOTE — Progress Notes (Signed)
Patient ID: Boden Stucky, male   DOB: 1945-05-12, 72 y.o.   MRN: [id number]   Subjective:    Patient ID: [name], male    DOB: 1945-12-20, 72 y.o.   MRN: [id number]  HPI  Patient here for a scheduled follow up.  Was evaluated at acute care 08/11/17 after slipping and falling while washing his car.  Hit his ribs against the car.  Evaluated for persistent left anterolateral rib pain.  Xray - no acute bony process.  Felt likely a rib contusion. Pain resolved after 8 weeks.  He reports that two weeks ago, he was reaching around and hit the same area.  Has had pain since.  Pain mostly with palpation.  Does also notice with deep breathing.  No sob.  No chest pain.  Exercising.  No acid reflux.  No abdominal pain.  Bowels moving.  No increased cough or congestion.  Hurts to lie flat.     Past Medical History:  Diagnosis Date  . Anemia, unspecified   . GERD (gastroesophageal reflux disease)   . Hypercholesterolemia   . Hyperglycemia   . Hypertension   . Thrombocytopenia ([place])   . Thyroid nodule    Past Surgical History:  Procedure Laterality Date  . COLONOSCOPY WITH PROPOFOL N/A 10/02/2014   Procedure: COLONOSCOPY WITH PROPOFOL;  Surgeon: Manya Silvas, MD;  [facility]: [facility] ENDOSCOPY;  Service: Endoscopy;  Laterality: N/A;  . INGUINAL HERNIA REPAIR    . TONSILLECTOMY     Family History  Problem Relation Age of Onset  . Prostate cancer Father   . Heart disease Father        s/p CABG  . Stroke Mother   . Colon cancer Neg Hx    Social History   Socioeconomic History  . Marital status: Married    Spouse name: Not on file  . Number of children: 4  . Years of education: Not on file  . Highest education level: Not on file  Occupational History  . Occupation: retired  Scientific laboratory technician  . Financial resource strain: Not on file  . Food insecurity:    Worry: Not on file    Inability: Not on file  . Transportation needs:    Medical: Not on file    Non-medical: Not on file  Tobacco Use    . Smoking status: Former Smoker    Packs/day: 1.00    Years: 30.00    Pack years: 30.00    Types: Cigarettes    Last attempt to quit: 04/12/1998    Years since quitting: 19.5  . Smokeless tobacco: Former Systems developer    Quit date: 04/12/1999  Substance and Sexual Activity  . Alcohol use: Not on file  . Drug use: Not on file  . Sexual activity: Never  Lifestyle  . Physical activity:    Days per week: Not on file    Minutes per session: Not on file  . Stress: Not on file  Relationships  . Social connections:    Talks on phone: Not on file    Gets together: Not on file    Attends religious service: Not on file    Active member of club or organization: Not on file    Attends meetings of clubs or organizations: Not on file    Relationship status: Not on file  Other Topics Concern  . Not on file  Social History Narrative   He is married. Has four children    Outpatient Encounter Medications as of 10/31/2017  Medication Sig  . aspirin 81 MG tablet Take 81 mg by mouth daily.  Marland Kitchen atorvastatin (LIPITOR) 10 MG tablet TAKE ONE TABLET EVERY DAY  . cyanocobalamin 500 MCG tablet Take 500 mcg by mouth daily.  . finasteride (PROSCAR) 5 MG tablet   . Lactobacillus (PROBIOTIC ACIDOPHILUS PO) Take by mouth daily.  . Multiple Vitamin (MULTI VITAMIN MENS PO) Take 1 tablet by mouth daily.  Marland Kitchen nystatin cream (MYCOSTATIN) Apply 1 application topically 2 (two) times daily.  . Omega-3 Fatty Acids (FISH OIL) 1200 MG CAPS Take 1 capsule by mouth daily.  . Omeprazole Magnesium (PRILOSEC OTC PO) Take by mouth daily.  . [DISCONTINUED] atorvastatin (LIPITOR) 10 MG tablet Take 1 tablet (10 mg total) by mouth daily.   No facility-administered encounter medications on file as of 10/31/2017.     Review of Systems  Constitutional: Negative for appetite change and unexpected weight change.  HENT: Negative for congestion and sinus pressure.   Respiratory: Negative for cough, chest tightness and shortness of breath.         Anterior left lower rib pain to palpation.   Cardiovascular: Negative for chest pain, palpitations and leg swelling.  Gastrointestinal: Negative for abdominal pain, diarrhea, nausea and vomiting.  Genitourinary: Negative for difficulty urinating and dysuria.  Musculoskeletal: Negative for back pain and joint swelling.  Skin: Negative for color change and rash.  Neurological: Negative for dizziness, light-headedness and headaches.  Psychiatric/Behavioral: Negative for agitation and dysphoric mood.       Objective:     Blood pressure rechecked by me:  126/78  Physical Exam  Constitutional: He appears well-developed and well-nourished. No distress.  HENT:  Nose: Nose normal.  Mouth/Throat: Oropharynx is clear and moist.  Neck: Neck supple.  Cardiovascular: Normal rate and regular rhythm.  Pulmonary/Chest: Effort normal and breath sounds normal. No respiratory distress.  Pain to palpation over left lower anterior ribs.  Good breath sounds bilaterally.    Abdominal: Soft. Bowel sounds are normal. There is no tenderness.  Musculoskeletal: He exhibits no edema or tenderness.  Lymphadenopathy:    He has no cervical adenopathy.  Skin: No rash noted. No erythema.  Psychiatric: He has a normal mood and affect. His behavior is normal.    BP 128/84 (BP Location: Left Arm, Patient Position: Sitting, Cuff Size: Normal)   Pulse (!) 51   Temp 98.1 F (36.7 C) (Oral)   Resp 18   Wt 183 lb 9.6 oz (83.3 kg)   SpO2 98%   BMI 25.61 kg/m  Wt Readings from Last 3 Encounters:  10/31/17 183 lb 9.6 oz (83.3 kg)  08/09/17 191 lb 6.4 oz (86.8 kg)  05/03/17 188 lb (85.3 kg)     Lab Results  Component Value Date   WBC 6.9 10/31/2017   HGB 13.5 10/31/2017   HCT 39.9 10/31/2017   PLT 162.0 10/31/2017   GLUCOSE 98 10/31/2017   CHOL 158 10/31/2017   TRIG 85.0 10/31/2017   HDL 48.90 10/31/2017   LDLCALC 92 10/31/2017   ALT 12 10/31/2017   AST 13 10/31/2017   NA 139 10/31/2017   K 4.8  10/31/2017   CL 104 10/31/2017   CREATININE 1.08 10/31/2017   BUN 17 10/31/2017   CO2 29 10/31/2017   TSH 1.31 10/31/2017   PSA 1.87 10/31/2017   HGBA1C 5.9 10/31/2017    Dg Finger Middle Right  Result Date: 10/07/2016 CLINICAL DATA:  Finger laceration this morning. Injury to the region of the distal EXAM: RIGHT MIDDLE  FINGER 2+V COMPARISON:  Down FINDINGS: There is no evidence of fracture or dislocation. There is no evidence of arthropathy or other focal bone abnormality. Soft tissue defect noted distal finger. No evidence for underlying retained radiopaque soft tissue foreign body. IMPRESSION: No acute bony findings. Electronically Signed   By: Misty Stanley M.D.   On: 10/07/2016 12:33       Assessment & Plan:   Problem List Items Addressed This Visit    Aortic atherosclerosis ([place])    On lipitor.        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  Has f/u scheduled.  Last check stable.       Coronary artery disease    Currently asymptomatic.  Continue risk factor modification.        Elevated prostate specific antigen (PSA)    Has been followed by urology.  On finasteride.        Relevant Orders   PSA, Medicare (Completed)   Health care maintenance    Last physical 05/03/17.  Will schedule physical when due.        Hypercholesterolemia    On lipitor.  Low cholesterol diet and exercise.  Follow lipid panel and liver function tests.        Relevant Orders   Hepatic function panel (Completed)   Lipid panel (Completed)   Hyperglycemia - Primary    Low carb diet and exercise.  Follow met b and a1c.        Relevant Orders   Hemoglobin A1c (Completed)   Hypertension, essential    Blood pressure under good control.  Continue same medication regimen.  Follow pressures.  Follow metabolic panel.        Relevant Orders   CBC with Differential/Platelet (Completed)   Basic metabolic panel (Completed)   Rib pain    Lower anterior rib pain as outlined.   Previous injury.  Aggravated recently.  Discussed further evaluation.  He declines.  Wants to follow.  Will notify me if persistent.  Call with update.        Thyroid nodule    Worked up by Dr Eddie Dibbles.  Previous biopsy negative.  Last evaluated 12/2015.  Stable.  Recommended f/u prn.        Relevant Orders   TSH (Completed)       Einar Pheasant, MD

## 2017-10-31 NOTE — Assessment & Plan Note (Signed)
Lower anterior rib pain as outlined.  Previous injury.  Aggravated recently.  Discussed further evaluation.  He declines.  Wants to follow.  Will notify me if persistent.  Call with update.

## 2017-10-31 NOTE — Assessment & Plan Note (Signed)
On lipitor.  Low cholesterol diet and exercise.  Follow lipid panel and liver function tests.   

## 2017-10-31 NOTE — Assessment & Plan Note (Signed)
Currently asymptomatic.  Continue risk factor modification.  

## 2017-10-31 NOTE — Assessment & Plan Note (Signed)
Blood pressure under good control.  Continue same medication regimen.  Follow pressures.  Follow metabolic panel.   

## 2017-10-31 NOTE — Assessment & Plan Note (Signed)
Worked up by Dr Eddie Dibbles.  Previous biopsy negative.  Last evaluated 12/2015.  Stable.  Recommended f/u prn.

## 2017-10-31 NOTE — Assessment & Plan Note (Signed)
On lipitor

## 2017-10-31 NOTE — Assessment & Plan Note (Signed)
Low carb diet and exercise.  Follow met b and a1c.   

## 2017-10-31 NOTE — Assessment & Plan Note (Signed)
Followed by vascular surgery.  Has f/u scheduled.  Last check stable.

## 2017-10-31 NOTE — Assessment & Plan Note (Signed)
Has been followed by urology.  On finasteride.

## 2017-11-01 ENCOUNTER — Encounter: Payer: Self-pay | Admitting: Internal Medicine

## 2017-11-09 ENCOUNTER — Other Ambulatory Visit: Payer: Self-pay | Admitting: Internal Medicine

## 2017-11-09 DIAGNOSIS — R05 Cough: Secondary | ICD-10-CM

## 2017-11-09 DIAGNOSIS — R053 Chronic cough: Secondary | ICD-10-CM

## 2017-11-09 NOTE — Progress Notes (Signed)
Order placed for cxr.  

## 2017-11-14 ENCOUNTER — Ambulatory Visit (INDEPENDENT_AMBULATORY_CARE_PROVIDER_SITE_OTHER): Payer: Medicare Other

## 2017-11-14 DIAGNOSIS — R05 Cough: Secondary | ICD-10-CM | POA: Diagnosis not present

## 2017-11-14 DIAGNOSIS — R053 Chronic cough: Secondary | ICD-10-CM

## 2017-11-16 ENCOUNTER — Ambulatory Visit: Payer: Medicare Other | Admitting: Internal Medicine

## 2017-11-16 DIAGNOSIS — R05 Cough: Secondary | ICD-10-CM

## 2017-11-16 DIAGNOSIS — E7800 Pure hypercholesterolemia, unspecified: Secondary | ICD-10-CM | POA: Diagnosis not present

## 2017-11-16 DIAGNOSIS — R059 Cough, unspecified: Secondary | ICD-10-CM

## 2017-11-16 DIAGNOSIS — Z87891 Personal history of nicotine dependence: Secondary | ICD-10-CM | POA: Diagnosis not present

## 2017-11-16 DIAGNOSIS — I10 Essential (primary) hypertension: Secondary | ICD-10-CM

## 2017-11-16 MED ORDER — AZITHROMYCIN 250 MG PO TABS: ORAL_TABLET | ORAL | 0 refills | Status: DC

## 2017-11-16 NOTE — Progress Notes (Signed)
Patient ID: Nathan Watts, male   DOB: [birth year], 72 y.o.   MRN: [id number]   Subjective:    Patient ID: Nathan Watts, male    DOB: 07-26-1945, 72 y.o.   MRN: [id number]  HPI  Patient here as a work in with concerns regarding persistent cough.  His wife had reported noticing a persistent cough.  Asked to come in for cxr.  Chest xray revealed mild interstitial prominence bilaterally with was felt to possibly reflect acute on chronic bronchitis.  He states he has noticed over the last few days some increased congestion.  Head more stopped up.  Taking mucinex and using nasal spray.  Cough is productive at times.  No fever.  Does not feel bad.  No sob.  No nausea or vomiting.  No diarrhea.  Quit smoking in late 1990s.     Past Medical History:  Diagnosis Date  . Anemia, unspecified   . GERD (gastroesophageal reflux disease)   . Hypercholesterolemia   . Hyperglycemia   . Hypertension   . Thrombocytopenia ([place])   . Thyroid nodule    Past Surgical History:  Procedure Laterality Date  . COLONOSCOPY WITH PROPOFOL N/A 10/02/2014   Procedure: COLONOSCOPY WITH PROPOFOL;  Surgeon: Manya Silvas, MD;  Location: [facility] ENDOSCOPY;  Service: Endoscopy;  Laterality: N/A;  . INGUINAL HERNIA REPAIR    . TONSILLECTOMY     Family History  Problem Relation Age of Onset  . Prostate cancer Father   . Heart disease Father        s/p CABG  . Stroke Mother   . Colon cancer Neg Hx    Social History   Socioeconomic History  . Marital status: Married    Spouse name: Not on file  . Number of children: 4  . Years of education: Not on file  . Highest education level: Not on file  Occupational History  . Occupation: retired  Scientific laboratory technician  . Financial resource strain: Not on file  . Food insecurity:    Worry: Not on file    Inability: Not on file  . Transportation needs:    Medical: Not on file    Non-medical: Not on file  Tobacco Use  . Smoking status: Former Smoker    Packs/day: 1.00    Years:  30.00    Pack years: 30.00    Types: Cigarettes    Last attempt to quit: 04/12/1998    Years since quitting: 19.6  . Smokeless tobacco: Former Systems developer    Quit date: 04/12/1999  Substance and Sexual Activity  . Alcohol use: Not on file  . Drug use: Not on file  . Sexual activity: Never  Lifestyle  . Physical activity:    Days per week: Not on file    Minutes per session: Not on file  . Stress: Not on file  Relationships  . Social connections:    Talks on phone: Not on file    Gets together: Not on file    Attends religious service: Not on file    Active member of club or organization: Not on file    Attends meetings of clubs or organizations: Not on file    Relationship status: Not on file  Other Topics Concern  . Not on file  Social History Narrative   He is married. Has four children    Outpatient Encounter Medications as of 11/16/2017  Medication Sig  . aspirin 81 MG tablet Take 81 mg by mouth daily.  Marland Kitchen atorvastatin (LIPITOR)  10 MG tablet TAKE ONE TABLET EVERY DAY  . cyanocobalamin 500 MCG tablet Take 500 mcg by mouth daily.  . finasteride (PROSCAR) 5 MG tablet   . Lactobacillus (PROBIOTIC ACIDOPHILUS PO) Take by mouth daily.  . Multiple Vitamin (MULTI VITAMIN MENS PO) Take 1 tablet by mouth daily.  Marland Kitchen nystatin cream (MYCOSTATIN) Apply 1 application topically 2 (two) times daily.  . Omega-3 Fatty Acids (FISH OIL) 1200 MG CAPS Take 1 capsule by mouth daily.  . Omeprazole Magnesium (PRILOSEC OTC PO) Take by mouth daily.  Marland Kitchen azithromycin (ZITHROMAX) 250 MG tablet Take 2 tablets x 1 day and then one per day for four more days.   No facility-administered encounter medications on file as of 11/16/2017.     Review of Systems  Constitutional: Negative for appetite change and unexpected weight change.  HENT: Positive for congestion and sinus pressure.   Respiratory: Positive for cough. Negative for chest tightness and shortness of breath.   Cardiovascular: Negative for chest pain and  leg swelling.  Gastrointestinal: Negative for diarrhea, nausea and vomiting.  Musculoskeletal: Negative for joint swelling and myalgias.  Skin: Negative for rash.  Neurological: Negative for dizziness, light-headedness and headaches.       Objective:    Physical Exam  Constitutional: He appears well-developed and well-nourished. No distress.  HENT:  Nose: Nose normal.  Mouth/Throat: Oropharynx is clear and moist.  Neck: Neck supple.  Cardiovascular: Normal rate and regular rhythm.  Pulmonary/Chest: Effort normal and breath sounds normal. No respiratory distress.  Abdominal: Soft. Bowel sounds are normal. There is no tenderness.  Musculoskeletal: He exhibits no edema or tenderness.  Skin: No rash noted. No erythema.  Psychiatric: He has a normal mood and affect. His behavior is normal.    BP 126/78 (BP Location: Left Arm, Patient Position: Sitting, Cuff Size: Normal)   Pulse (!) 51   Temp 98.1 F (36.7 C) (Oral)   Resp 18   Wt 183 lb 9.6 oz (83.3 kg)   SpO2 98%   BMI 25.61 kg/m  Wt Readings from Last 3 Encounters:  11/16/17 183 lb 9.6 oz (83.3 kg)  10/31/17 183 lb 9.6 oz (83.3 kg)  08/09/17 191 lb 6.4 oz (86.8 kg)     Lab Results  Component Value Date   WBC 6.9 10/31/2017   HGB 13.5 10/31/2017   HCT 39.9 10/31/2017   PLT 162.0 10/31/2017   GLUCOSE 98 10/31/2017   CHOL 158 10/31/2017   TRIG 85.0 10/31/2017   HDL 48.90 10/31/2017   LDLCALC 92 10/31/2017   ALT 12 10/31/2017   AST 13 10/31/2017   NA 139 10/31/2017   K 4.8 10/31/2017   CL 104 10/31/2017   CREATININE 1.08 10/31/2017   BUN 17 10/31/2017   CO2 29 10/31/2017   TSH 1.31 10/31/2017   PSA 1.87 10/31/2017   HGBA1C 5.9 10/31/2017       Assessment & Plan:   Problem List Items Addressed This Visit    Cough    Cough and cxr as outlined.  Treat with zpak as directed.  Saline nasal spray and sterioid nasal spray.  Follow.  Given smoking history, obtain screening CT.        History of tobacco abuse      Previously smoked one pack per day for 30+ years.  Has quit.  Quit around 2000.  Given 30 pack year history of smoking, will see if can arrange CTchest -for lung cancer screening.  Hypercholesterolemia    On lipitor.  Low cholesterol diet and exercise.  Follow lipid panel and liver function tests.        Hypertension, essential    Blood pressure under good control.  Continue same medication regimen.  Follow pressures.  Follow metabolic panel.            Einar Pheasant, MD

## 2017-11-20 ENCOUNTER — Encounter: Payer: Self-pay | Admitting: Internal Medicine

## 2017-11-20 DIAGNOSIS — R053 Chronic cough: Secondary | ICD-10-CM | POA: Insufficient documentation

## 2017-11-20 DIAGNOSIS — R059 Cough, unspecified: Secondary | ICD-10-CM | POA: Insufficient documentation

## 2017-11-20 DIAGNOSIS — R05 Cough: Secondary | ICD-10-CM | POA: Insufficient documentation

## 2017-11-20 NOTE — Assessment & Plan Note (Signed)
On lipitor.  Low cholesterol diet and exercise.  Follow lipid panel and liver function tests.   

## 2017-11-20 NOTE — Assessment & Plan Note (Signed)
Blood pressure under good control.  Continue same medication regimen.  Follow pressures.  Follow metabolic panel.   

## 2017-11-20 NOTE — Assessment & Plan Note (Signed)
Cough and cxr as outlined.  Treat with zpak as directed.  Saline nasal spray and sterioid nasal spray.  Follow.  Given smoking history, obtain screening CT.

## 2017-11-20 NOTE — Assessment & Plan Note (Signed)
Previously smoked one pack per day for 30+ years.  Has quit.  Quit around 2000.  Given 30 pack year history of smoking, will see if can arrange CTchest -for lung cancer screening.

## 2017-11-21 ENCOUNTER — Other Ambulatory Visit: Payer: Self-pay | Admitting: Internal Medicine

## 2017-11-21 ENCOUNTER — Telehealth: Payer: Self-pay | Admitting: Internal Medicine

## 2017-11-21 DIAGNOSIS — R9389 Abnormal findings on diagnostic imaging of other specified body structures: Secondary | ICD-10-CM

## 2017-11-21 NOTE — Progress Notes (Signed)
  Order placed for f/u abnormal cxr.

## 2017-11-21 NOTE — Telephone Encounter (Signed)
Please call and notify pt that we are unable to do the screening CT secondary to more than 15 years stopped.  See attached message.  I gave him abx when here.  Make sure doing ok and notify him to call us with update. I would like to repeat cxr in 8 weeks.

## 2017-11-21 NOTE — Telephone Encounter (Signed)
-----   Message from Lieutenant Diego, RN sent at 11/21/2017  7:07 AM EDT ----- Regarding: RE: screening CT Unfortunately after 15 years of being quit, we can not do a lung screening scan. Sorry, but the belief is that after 15 years the risk goes down and I've not heard of any recommendations for screening beyond that .  Thank you so much for considering lung screening though. Have a great week. ----- Message ----- From: Einar Pheasant, MD Sent: 11/20/2017   2:26 PM EDT To: Lieutenant Diego, RN Subject: screening CT                                   This pt has a previous history of smoking.  Smoked 30+ years - one pack per day.  Quit in 2000.  Can we schedule a screening CT.  Let me know if I need to do anything.    Thanks.   Einar Pheasant

## 2017-11-21 NOTE — Telephone Encounter (Signed)
Patient is aware and will come in about 8 weeks to repeat cxr

## 2017-12-15 ENCOUNTER — Encounter: Payer: Self-pay | Admitting: Internal Medicine

## 2017-12-16 ENCOUNTER — Encounter: Payer: Self-pay | Admitting: Internal Medicine

## 2017-12-16 DIAGNOSIS — M5440 Lumbago with sciatica, unspecified side: Secondary | ICD-10-CM

## 2017-12-16 NOTE — Telephone Encounter (Signed)
Order placed for l-s spine xray.  Pt notified via my chart.

## 2017-12-19 ENCOUNTER — Ambulatory Visit: Payer: Medicare Other

## 2017-12-19 ENCOUNTER — Ambulatory Visit (INDEPENDENT_AMBULATORY_CARE_PROVIDER_SITE_OTHER): Payer: Medicare Other

## 2017-12-19 DIAGNOSIS — M5440 Lumbago with sciatica, unspecified side: Secondary | ICD-10-CM | POA: Diagnosis not present

## 2017-12-19 DIAGNOSIS — M47816 Spondylosis without myelopathy or radiculopathy, lumbar region: Secondary | ICD-10-CM | POA: Diagnosis not present

## 2017-12-20 ENCOUNTER — Other Ambulatory Visit: Payer: Self-pay | Admitting: Internal Medicine

## 2017-12-20 DIAGNOSIS — M545 Low back pain: Secondary | ICD-10-CM

## 2017-12-20 NOTE — Progress Notes (Signed)
Order placed for referral to physical therapy.  ([place])

## 2017-12-28 DIAGNOSIS — M545 Low back pain: Secondary | ICD-10-CM | POA: Diagnosis not present

## 2018-01-02 DIAGNOSIS — M545 Low back pain: Secondary | ICD-10-CM | POA: Diagnosis not present

## 2018-01-05 DIAGNOSIS — M545 Low back pain: Secondary | ICD-10-CM | POA: Diagnosis not present

## 2018-01-10 ENCOUNTER — Encounter: Payer: Self-pay | Admitting: Internal Medicine

## 2018-01-10 DIAGNOSIS — M545 Low back pain: Secondary | ICD-10-CM | POA: Diagnosis not present

## 2018-01-12 DIAGNOSIS — M545 Low back pain: Secondary | ICD-10-CM | POA: Diagnosis not present

## 2018-01-16 ENCOUNTER — Ambulatory Visit (INDEPENDENT_AMBULATORY_CARE_PROVIDER_SITE_OTHER): Payer: Medicare Other

## 2018-01-16 DIAGNOSIS — Z23 Encounter for immunization: Secondary | ICD-10-CM | POA: Diagnosis not present

## 2018-03-15 DIAGNOSIS — M7989 Other specified soft tissue disorders: Secondary | ICD-10-CM | POA: Diagnosis not present

## 2018-03-15 DIAGNOSIS — M79644 Pain in right finger(s): Secondary | ICD-10-CM | POA: Diagnosis not present

## 2018-03-27 ENCOUNTER — Encounter: Payer: Self-pay | Admitting: Urology

## 2018-03-27 ENCOUNTER — Ambulatory Visit: Payer: Medicare Other | Admitting: Urology

## 2018-03-27 VITALS — BP 149/81 | HR 52 | Ht 71.0 in | Wt 186.6 lb

## 2018-03-27 DIAGNOSIS — N400 Enlarged prostate without lower urinary tract symptoms: Secondary | ICD-10-CM | POA: Diagnosis not present

## 2018-03-27 DIAGNOSIS — Z87898 Personal history of other specified conditions: Secondary | ICD-10-CM | POA: Diagnosis not present

## 2018-03-27 DIAGNOSIS — N401 Enlarged prostate with lower urinary tract symptoms: Secondary | ICD-10-CM | POA: Diagnosis not present

## 2018-03-27 DIAGNOSIS — N138 Other obstructive and reflux uropathy: Secondary | ICD-10-CM | POA: Insufficient documentation

## 2018-03-27 LAB — URINALYSIS, COMPLETE
Bilirubin, UA: NEGATIVE
Glucose, UA: NEGATIVE
Ketones, UA: NEGATIVE
Leukocytes, UA: NEGATIVE
Nitrite, UA: NEGATIVE
Protein, UA: NEGATIVE
Specific Gravity, UA: 1.02 (ref 1.005–1.030)
Urobilinogen, Ur: 0.2 mg/dL (ref 0.2–1.0)
pH, UA: 6 (ref 5.0–7.5)

## 2018-03-27 LAB — MICROSCOPIC EXAMINATION
EPITHELIAL CELLS (NON RENAL): NONE SEEN /HPF (ref 0–10)
WBC, UA: NONE SEEN /hpf (ref 0–5)

## 2018-03-27 LAB — BLADDER SCAN AMB NON-IMAGING: Scan Result: 68

## 2018-03-27 MED ORDER — FINASTERIDE 5 MG PO TABS: 5.0000 mg | ORAL_TABLET | Freq: Every day | ORAL | 3 refills | Status: DC

## 2018-03-27 NOTE — Progress Notes (Signed)
03/27/2018 9:01 AM   Nathan Watts July 28, 1945 [id number]  Referring provider: Einar Pheasant, [address] [place], [place] [address]  Chief Complaint  Patient presents with  . Establish Care  . Benign Prostatic Hypertrophy    Urologic History 1. Benign Prostatic Hypertrophy  - On finasteride  2. History of Elevated PSA  - One prior PA slightly elevated, returned to baseline  - Prostate Biopsy (04/2015); PSA 4.25; 97 gram prostate; benign pathology  HPI: Nathan Watts is a 72 y.o. male that presents today to establish care for his BPH  - Reports nocturia X 2-3  - Denies dysuria, gross hematuria or flank/abdominal/pelvic/scrotal pain  - uncorrected PSA 1.87 (07/22/209); UA today was bland; PVR 85ml; IPSS 15/2  IPSS    Row Name 03/27/18 0800         International Prostate Symptom Score   How often have you had the sensation of not emptying your bladder?  Less than 1 in 5     How often have you had to urinate less than every two hours?  Less than half the time     How often have you found you stopped and started again several times when you urinated?  More than half the time     How often have you found it difficult to postpone urination?  Less than 1 in 5 times     How often have you had a weak urinary stream?  Less than half the time     How often have you had to strain to start urination?  Less than half the time     How many times did you typically get up at night to urinate?  3 Times     Total IPSS Score  15       Quality of Life due to urinary symptoms   If you were to spend the rest of your life with your urinary condition just the way it is now how would you feel about that?  Mostly Satisfied         PMH: Past Medical History:  Diagnosis Date  . Anemia, unspecified   . GERD (gastroesophageal reflux disease)   . Hypercholesterolemia   . Hyperglycemia   . Hypertension   . Thrombocytopenia ([place])   . Thyroid nodule     Surgical  History: Past Surgical History:  Procedure Laterality Date  . COLONOSCOPY WITH PROPOFOL N/A 10/02/2014   Procedure: COLONOSCOPY WITH PROPOFOL;  Surgeon: Manya Silvas, MD;  Location: [facility] ENDOSCOPY;  Service: Endoscopy;  Laterality: N/A;  . INGUINAL HERNIA REPAIR    . TONSILLECTOMY      Home Medications:  Allergies as of 03/27/2018      Reactions   Hydrocodone-chlorpheniramine Other (See Comments)   Unknown, possible causes anxiety  possible causes anxiety Unknown, possible causes anxiety   Crestor [rosuvastatin] Rash   Causes GI upset and anxiety      Medication List       Accurate as of March 27, 2018  9:01 AM. Always use your most recent med list.        aspirin 81 MG tablet Take 81 mg by mouth daily.   atorvastatin 10 MG tablet Commonly known as:  LIPITOR TAKE ONE TABLET EVERY DAY   finasteride 5 MG tablet Commonly known as:  PROSCAR   Fish Oil 1200 MG Caps Take 1 capsule by mouth daily.   MULTI VITAMIN MENS PO Take 1 tablet by mouth daily.   PRILOSEC OTC  PO Take by mouth daily.   PROBIOTIC ACIDOPHILUS PO Take by mouth daily.       Allergies:  Allergies  Allergen Reactions  . Hydrocodone-Chlorpheniramine Other (See Comments)    Unknown, possible causes anxiety  possible causes anxiety Unknown, possible causes anxiety  . Crestor [Rosuvastatin] Rash    Causes GI upset and anxiety    Family History: Family History  Problem Relation Age of Onset  . Prostate cancer Father   . Heart disease Father        s/p CABG  . Stroke Mother   . Colon cancer Neg Hx     Social History:  reports that he quit smoking about 19 years ago. His smoking use included cigarettes. He has a 30.00 pack-year smoking history. He quit smokeless tobacco use about 18 years ago. No history on file for alcohol and drug.  ROS: UROLOGY Frequent Urination?: Yes Hard to postpone urination?: No Burning/pain with urination?: No Get up at night to urinate?: Yes Leakage of  urine?: No Urine stream starts and stops?: Yes Trouble starting stream?: No Do you have to strain to urinate?: No Blood in urine?: No Urinary tract infection?: No Sexually transmitted disease?: No Injury to kidneys or bladder?: No Painful intercourse?: No Weak stream?: No Erection problems?: No Penile pain?: No  Gastrointestinal Nausea?: No Vomiting?: No Indigestion/heartburn?: No Diarrhea?: No Constipation?: No  Constitutional Fever: No Night sweats?: No Fatigue?: No  Skin Skin rash/lesions?: No Itching?: No  Eyes Blurred vision?: No Double vision?: No  Ears/Nose/Throat Sore throat?: No Sinus problems?: No  Hematologic/Lymphatic Swollen glands?: No Easy bruising?: No  Cardiovascular Leg swelling?: No Chest pain?: No  Respiratory Cough?: No Shortness of breath?: No  Endocrine Excessive thirst?: No  Musculoskeletal Back pain?: No Joint pain?: No  Neurological Headaches?: No Dizziness?: No  Psychologic Depression?: No Anxiety?: No  Physical Exam: BP (!) 149/81 (BP Location: Left Arm, Patient Position: Sitting, Cuff Size: Large)   Pulse (!) 52   Ht 5\' 11"  (1.803 m)   Wt 186 lb 9.6 oz (84.6 kg)   BMI 26.03 kg/m   Constitutional:  Alert and oriented, No acute distress. Respiratory: Normal respiratory effort, no increased work of breathing. GU: No CVA tenderness Rectal: Prostate is 60+ grams. No nodules appreciated Lymph: No cervical or inguinal lymphadenopathy. Skin: No rashes, bruises or suspicious lesions. Neurologic: Grossly intact, no focal deficits, moving all 4 extremities. Psychiatric: Normal mood and affect.  Laboratory Data: Lab Results  Component Value Date   WBC 6.9 10/31/2017   HGB 13.5 10/31/2017   HCT 39.9 10/31/2017   MCV 89.4 10/31/2017   PLT 162.0 10/31/2017   Lab Results  Component Value Date   CREATININE 1.08 10/31/2017   Lab Results  Component Value Date   PSA 1.87 10/31/2017   PSA 1.84 04/14/2016   PSA  4.44 (H) 01/06/2015   Lab Results  Component Value Date   HGBA1C 5.9 10/31/2017   Pertinent Imaging: Results for DUILIO, HERITAGE (MRN [id number]) as of 03/27/2018 08:52  Ref. Range 12/09/2010 13:44 03/27/2018 08:38  Scan Result Unknown  68 ml    Assessment & Plan:    1. BPH  - PSA is stable (1.87; 10/31/2017)  - UA today was bland; PVR 19ml; IPSS 15/2  - Denies lower urinary tract symptoms   - Continue finasteride; refilled  - Follow up in 1 year  Return in about 1 year (around 03/28/2019) for IPSS, PVR and exam.   Bernardo Heater, Ronda Fairly, MD  [facility] [address], [place],  [address] [phone]  I, Temidayo Atanda-[place] , am acting as a scribe for [employer], MD  I, Abbie Sons, MD, have reviewed all documentation for this visit. The documentation on 03/27/18 for the exam, diagnosis, procedures, and orders are all accurate and complete.

## 2018-04-26 ENCOUNTER — Other Ambulatory Visit: Payer: Self-pay | Admitting: Internal Medicine

## 2018-05-10 ENCOUNTER — Encounter: Payer: Self-pay | Admitting: Internal Medicine

## 2018-05-10 ENCOUNTER — Ambulatory Visit (INDEPENDENT_AMBULATORY_CARE_PROVIDER_SITE_OTHER): Payer: Medicare Other | Admitting: Internal Medicine

## 2018-05-10 VITALS — BP 126/70 | HR 52 | Temp 97.8°F | Resp 16 | Ht 71.0 in | Wt 180.8 lb

## 2018-05-10 DIAGNOSIS — I723 Aneurysm of iliac artery: Secondary | ICD-10-CM

## 2018-05-10 DIAGNOSIS — I10 Essential (primary) hypertension: Secondary | ICD-10-CM | POA: Diagnosis not present

## 2018-05-10 DIAGNOSIS — E7800 Pure hypercholesterolemia, unspecified: Secondary | ICD-10-CM

## 2018-05-10 DIAGNOSIS — N401 Enlarged prostate with lower urinary tract symptoms: Secondary | ICD-10-CM

## 2018-05-10 DIAGNOSIS — R739 Hyperglycemia, unspecified: Secondary | ICD-10-CM

## 2018-05-10 DIAGNOSIS — I2510 Atherosclerotic heart disease of native coronary artery without angina pectoris: Secondary | ICD-10-CM

## 2018-05-10 DIAGNOSIS — Z0000 Encounter for general adult medical examination without abnormal findings: Secondary | ICD-10-CM | POA: Diagnosis not present

## 2018-05-10 DIAGNOSIS — E041 Nontoxic single thyroid nodule: Secondary | ICD-10-CM | POA: Diagnosis not present

## 2018-05-10 DIAGNOSIS — N138 Other obstructive and reflux uropathy: Secondary | ICD-10-CM

## 2018-05-10 DIAGNOSIS — I700 Atherosclerosis of aorta: Secondary | ICD-10-CM

## 2018-05-10 LAB — BASIC METABOLIC PANEL
BUN: 14 mg/dL (ref 6–23)
CO2: 28 mEq/L (ref 19–32)
CREATININE: 1.05 mg/dL (ref 0.40–1.50)
Calcium: 9.7 mg/dL (ref 8.4–10.5)
Chloride: 102 mEq/L (ref 96–112)
GFR: 69.11 mL/min (ref >60.00)
Glucose, Bld: 99 mg/dL (ref 70–99)
Potassium: 4.5 mEq/L (ref 3.5–5.1)
Sodium: 138 mEq/L (ref 135–145)

## 2018-05-10 LAB — HEPATIC FUNCTION PANEL
ALT: 14 U/L (ref 0–53)
AST: 17 U/L (ref 0–37)
Albumin: 4.3 g/dL (ref 3.5–5.2)
Alkaline Phosphatase: 57 U/L (ref 39–117)
Bilirubin, Direct: 0.1 mg/dL (ref 0.0–0.3)
Total Bilirubin: 0.7 mg/dL (ref 0.2–1.2)
Total Protein: 7 g/dL (ref 6.0–8.3)

## 2018-05-10 LAB — LIPID PANEL
Cholesterol: 145 mg/dL (ref 0–200)
HDL: 46.5 mg/dL (ref >39.00)
LDL Cholesterol: 83 mg/dL (ref 0–99)
NonHDL: 98.39
Total CHOL/HDL Ratio: 3
Triglycerides: 75 mg/dL (ref 0.0–149.0)
VLDL: 15 mg/dL (ref 0.0–40.0)

## 2018-05-10 LAB — HEMOGLOBIN A1C: Hgb A1c MFr Bld: 5.7 % (ref 4.6–6.5)

## 2018-05-10 LAB — TSH: TSH: 0.95 u[IU]/mL (ref 0.35–4.50)

## 2018-05-10 NOTE — Progress Notes (Signed)
Patient ID: Nathan Watts, male   DOB: 08-22-1945, 73 y.o.   MRN: [id number]   Subjective:    Patient ID: Nathan Watts, male    DOB: Feb 02, 1946, 73 y.o.   MRN: [id number]  HPI  Patient here for his physical exam.  He reports he is doing well.  Feels good.  Exercising.  No chest pain.  No sob.  No acid reflux.  No abdominal pain.  Bowels moving.  No urine change.  Saw urology 03/27/18.  On finasteride.  Doing well.  Did not qualify for screening CT chest.  No longer smoking and has been quit for years.  Blood pressures have been doing well.     Past Medical History:  Diagnosis Date  . Anemia, unspecified   . GERD (gastroesophageal reflux disease)   . Hypercholesterolemia   . Hyperglycemia   . Hypertension   . Thrombocytopenia ([place])   . Thyroid nodule    Past Surgical History:  Procedure Laterality Date  . COLONOSCOPY WITH PROPOFOL N/A 10/02/2014   Procedure: COLONOSCOPY WITH PROPOFOL;  Surgeon: Manya Silvas, MD;  Location: [facility] ENDOSCOPY;  Service: Endoscopy;  Laterality: N/A;  . INGUINAL HERNIA REPAIR    . TONSILLECTOMY     Family History  Problem Relation Age of Onset  . Prostate cancer Father   . Heart disease Father        s/p CABG  . Stroke Mother   . Colon cancer Neg Hx    Social History   Socioeconomic History  . Marital status: Married    Spouse name: Not on file  . Number of children: 4  . Years of education: Not on file  . Highest education level: Not on file  Occupational History  . Occupation: retired  Scientific laboratory technician  . Financial resource strain: Not on file  . Food insecurity:    Worry: Not on file    Inability: Not on file  . Transportation needs:    Medical: Not on file    Non-medical: Not on file  Tobacco Use  . Smoking status: Former Smoker    Packs/day: 1.00    Years: 30.00    Pack years: 30.00    Types: Cigarettes    Last attempt to quit: 04/12/1998    Years since quitting: 20.0  . Smokeless tobacco: Former Systems developer    Quit date: 04/12/1999    Substance and Sexual Activity  . Alcohol use: Not on file  . Drug use: Not on file  . Sexual activity: Never  Lifestyle  . Physical activity:    Days per week: Not on file    Minutes per session: Not on file  . Stress: Not on file  Relationships  . Social connections:    Talks on phone: Not on file    Gets together: Not on file    Attends religious service: Not on file    Active member of club or organization: Not on file    Attends meetings of clubs or organizations: Not on file    Relationship status: Not on file  Other Topics Concern  . Not on file  Social History Narrative   He is married. Has four children    Outpatient Encounter Medications as of 05/10/2018  Medication Sig  . aspirin 81 MG tablet Take 81 mg by mouth daily.  Marland Kitchen atorvastatin (LIPITOR) 10 MG tablet TAKE ONE TABLET EVERY DAY  . finasteride (PROSCAR) 5 MG tablet Take 1 tablet (5 mg total) by mouth daily.  Marland Kitchen  Lactobacillus (PROBIOTIC ACIDOPHILUS PO) Take by mouth daily.  . Multiple Vitamin (MULTI VITAMIN MENS PO) Take 1 tablet by mouth daily.  . Omega-3 Fatty Acids (FISH OIL) 1200 MG CAPS Take 1 capsule by mouth daily.  . Omeprazole Magnesium (PRILOSEC OTC PO) Take by mouth daily.   No facility-administered encounter medications on file as of 05/10/2018.     Review of Systems  Constitutional: Negative for appetite change and unexpected weight change.  HENT: Negative for congestion and sinus pressure.   Eyes: Negative for pain and visual disturbance.  Respiratory: Negative for cough, chest tightness and shortness of breath.   Cardiovascular: Negative for chest pain, palpitations and leg swelling.  Gastrointestinal: Negative for abdominal pain, diarrhea, nausea and vomiting.  Genitourinary: Negative for difficulty urinating and dysuria.  Musculoskeletal: Negative for joint swelling and myalgias.  Skin: Negative for color change and rash.  Neurological: Negative for dizziness, light-headedness and headaches.   Hematological: Negative for adenopathy. Does not bruise/bleed easily.  Psychiatric/Behavioral: Negative for agitation and dysphoric mood.       Objective:    Physical Exam Constitutional:      General: He is not in acute distress.    Appearance: Normal appearance. He is well-developed.  HENT:     Head: Normocephalic and atraumatic.     Nose: Nose normal. No congestion.     Mouth/Throat:     Pharynx: No oropharyngeal exudate or posterior oropharyngeal erythema.  Eyes:     General:        Right eye: No discharge.        Left eye: No discharge.     Conjunctiva/sclera: Conjunctivae normal.  Neck:     Musculoskeletal: Neck supple. No muscular tenderness.     Thyroid: No thyromegaly.  Cardiovascular:     Rate and Rhythm: Normal rate and regular rhythm.  Pulmonary:     Effort: No respiratory distress.     Breath sounds: Normal breath sounds. No wheezing.  Abdominal:     General: Bowel sounds are normal.     Palpations: Abdomen is soft.     Tenderness: There is no abdominal tenderness.  Genitourinary:    Comments: Not performed.   Musculoskeletal:        General: No swelling or tenderness.  Lymphadenopathy:     Cervical: No cervical adenopathy.  Skin:    General: Skin is warm and dry.     Findings: No rash.  Neurological:     Mental Status: He is alert and oriented to person, place, and time.  Psychiatric:        Mood and Affect: Mood normal.        Behavior: Behavior normal.     BP 126/70 (BP Location: Left Arm, Patient Position: Sitting, Cuff Size: Normal)   Pulse (!) 52   Temp 97.8 F (36.6 C) (Oral)   Resp 16   Ht '5\' 11"'  (1.803 m)   Wt 180 lb 12.8 oz (82 kg)   SpO2 96%   BMI 25.22 kg/m  Wt Readings from Last 3 Encounters:  05/10/18 180 lb 12.8 oz (82 kg)  03/27/18 186 lb 9.6 oz (84.6 kg)  11/16/17 183 lb 9.6 oz (83.3 kg)     Lab Results  Component Value Date   WBC 6.9 10/31/2017   HGB 13.5 10/31/2017   HCT 39.9 10/31/2017   PLT 162.0 10/31/2017    GLUCOSE 99 05/10/2018   CHOL 145 05/10/2018   TRIG 75.0 05/10/2018   HDL 46.50 05/10/2018  LDLCALC 83 05/10/2018   ALT 14 05/10/2018   AST 17 05/10/2018   NA 138 05/10/2018   K 4.5 05/10/2018   CL 102 05/10/2018   CREATININE 1.05 05/10/2018   BUN 14 05/10/2018   CO2 28 05/10/2018   TSH 0.95 05/10/2018   PSA 1.87 10/31/2017   HGBA1C 5.7 05/10/2018    Dg Finger Middle Right  Result Date: 10/07/2016 CLINICAL DATA:  Finger laceration this morning. Injury to the region of the distal EXAM: RIGHT MIDDLE FINGER 2+V COMPARISON:  Down FINDINGS: There is no evidence of fracture or dislocation. There is no evidence of arthropathy or other focal bone abnormality. Soft tissue defect noted distal finger. No evidence for underlying retained radiopaque soft tissue foreign body. IMPRESSION: No acute bony findings. Electronically Signed   By: Misty Stanley M.D.   On: 10/07/2016 12:33       Assessment & Plan:   Problem List Items Addressed This Visit    Aortic atherosclerosis ([place])    On lipitor.        BPH with obstruction/lower urinary tract symptoms    Followed by urology.  On finasteride.        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        Coronary artery disease    Currently asymptomatic.  Continue risk factor modification.        Health care maintenance    Physical today 05/10/18.  Colonoscopy 09/2014 - internal hemorrhoids - otherwise normal.  Recommended f/u colonoscopy in 10 years.  Prostate followed by urology.        Hypercholesterolemia    On lipitor.  Low cholesterol diet and exercise.  Follow lipid panel and liver function tests.        Relevant Orders   Hepatic function panel (Completed)   Lipid panel (Completed)   Hyperglycemia    Low carb diet and exercise.  Follow met b and a1c.        Relevant Orders   Hemoglobin A1c (Completed)   Hypertension, essential    Blood pressure under good control.  Continue same medication regimen.   Follow pressures.  Follow metabolic panel.        Relevant Orders   Basic metabolic panel (Completed)   Thyroid nodule    Worked up by Dr Eddie Dibbles.  Previous biopsy negative.  Last check stable.  Recommended f/u prn.        Relevant Orders   TSH (Completed)    Other Visit Diagnoses    Routine general medical examination at a health care facility    -  Primary       Einar Pheasant, MD

## 2018-05-13 ENCOUNTER — Encounter: Payer: Self-pay | Admitting: Internal Medicine

## 2018-05-13 NOTE — Assessment & Plan Note (Signed)
Low carb diet and exercise.  Follow met b and a1c.   

## 2018-05-13 NOTE — Assessment & Plan Note (Signed)
Worked up by Dr Eddie Dibbles.  Previous biopsy negative.  Last check stable.  Recommended f/u prn.

## 2018-05-13 NOTE — Assessment & Plan Note (Signed)
On lipitor

## 2018-05-13 NOTE — Assessment & Plan Note (Signed)
Has been followed by vascular surgery.  

## 2018-05-13 NOTE — Assessment & Plan Note (Signed)
Blood pressure under good control.  Continue same medication regimen.  Follow pressures.  Follow metabolic panel.   

## 2018-05-13 NOTE — Assessment & Plan Note (Signed)
Currently asymptomatic.  Continue risk factor modification.  

## 2018-05-13 NOTE — Assessment & Plan Note (Signed)
Followed by urology.  On finasteride.

## 2018-05-13 NOTE — Assessment & Plan Note (Signed)
On lipitor.  Low cholesterol diet and exercise.  Follow lipid panel and liver function tests.   

## 2018-05-13 NOTE — Assessment & Plan Note (Signed)
Physical today 05/10/18.  Colonoscopy 09/2014 - internal hemorrhoids - otherwise normal.  Recommended f/u colonoscopy in 10 years.  Prostate followed by urology.

## 2018-05-15 ENCOUNTER — Telehealth: Payer: Self-pay | Admitting: Internal Medicine

## 2018-05-15 NOTE — Telephone Encounter (Signed)
-----   Message from Lieutenant Diego, RN sent at 05/15/2018 10:25 AM EST ----- Regarding: RE: CT chest It's no problem at all. Colletta Maryland said that if he was uninsured the fee would be cut in half, however, if he is insured, which is probably the case, and just wanting the scan that isn't really covered by insurance he would have to pay the whole amount I believe.   The best way to know up front would be for him to call billing at [phone] and tell them that you want to order a CT chest #[id number] and that they would be able to give him the cost to him.   I'm sorry that I don't have the exact amount but Colletta Maryland said that would be the best way to know for sure.  Shawn ----- Message ----- From: Einar Pheasant, MD Sent: 05/15/2018   9:09 AM EST To: Lieutenant Diego, RN Subject: RE: CT chest                                   Thank you for your help.  I did not mean for you to have to call someone else and spend more time on this.  I can contact her and see what the cost would be for him.  Thank you again.    Einar Pheasant ----- Message ----- From: Lieutenant Diego, RN Sent: 05/15/2018   8:49 AM EST To: Einar Pheasant, MD Subject: RE: CT chest                                   Hey Dr. Nicki Reaper, The out of pocket cost for lung screening is around $300-500. But with his smoking history I don't think they would do that scan even for self pay. I think it would have to be ordered as a low dose CT chest without contrast (not lung screening) and I don't know what it would cost. I'll contact Lenox Ponds at the [facility] to find out though. Shawn ----- Message ----- From: Einar Pheasant, MD Sent: 05/13/2018   7:08 PM EST To: Lieutenant Diego, RN Subject: CT chest                                       This pt has a remote history of smoking.  Quit years ago (20 years ago).  He is interested in paying for the screening CT chest.  Do you have any idea how much it would be for him to pay out of  pocket (or know who I would talk to - to find out the cost).    Thank you for your help.  Einar Pheasant

## 2018-06-22 DIAGNOSIS — D225 Melanocytic nevi of trunk: Secondary | ICD-10-CM | POA: Diagnosis not present

## 2018-06-22 DIAGNOSIS — D2262 Melanocytic nevi of left upper limb, including shoulder: Secondary | ICD-10-CM | POA: Diagnosis not present

## 2018-06-22 DIAGNOSIS — D2261 Melanocytic nevi of right upper limb, including shoulder: Secondary | ICD-10-CM | POA: Diagnosis not present

## 2018-06-22 DIAGNOSIS — D2272 Melanocytic nevi of left lower limb, including hip: Secondary | ICD-10-CM | POA: Diagnosis not present

## 2018-08-11 ENCOUNTER — Ambulatory Visit (INDEPENDENT_AMBULATORY_CARE_PROVIDER_SITE_OTHER): Payer: Medicare Other | Admitting: Vascular Surgery

## 2018-08-11 ENCOUNTER — Encounter (INDEPENDENT_AMBULATORY_CARE_PROVIDER_SITE_OTHER): Payer: Medicare Other

## 2018-09-12 ENCOUNTER — Other Ambulatory Visit: Payer: Self-pay

## 2018-09-12 ENCOUNTER — Ambulatory Visit (INDEPENDENT_AMBULATORY_CARE_PROVIDER_SITE_OTHER): Payer: Medicare Other

## 2018-09-12 ENCOUNTER — Encounter (INDEPENDENT_AMBULATORY_CARE_PROVIDER_SITE_OTHER): Payer: Self-pay | Admitting: Vascular Surgery

## 2018-09-12 ENCOUNTER — Ambulatory Visit (INDEPENDENT_AMBULATORY_CARE_PROVIDER_SITE_OTHER): Payer: Medicare Other | Admitting: Vascular Surgery

## 2018-09-12 VITALS — BP 128/79 | HR 50 | Resp 16 | Ht 71.0 in | Wt 190.8 lb

## 2018-09-12 DIAGNOSIS — I723 Aneurysm of iliac artery: Secondary | ICD-10-CM

## 2018-09-12 DIAGNOSIS — Z87891 Personal history of nicotine dependence: Secondary | ICD-10-CM

## 2018-09-12 DIAGNOSIS — I10 Essential (primary) hypertension: Secondary | ICD-10-CM | POA: Diagnosis not present

## 2018-09-12 DIAGNOSIS — I700 Atherosclerosis of aorta: Secondary | ICD-10-CM

## 2018-09-12 DIAGNOSIS — E7800 Pure hypercholesterolemia, unspecified: Secondary | ICD-10-CM | POA: Diagnosis not present

## 2018-09-12 DIAGNOSIS — Z79899 Other long term (current) drug therapy: Secondary | ICD-10-CM

## 2018-09-12 NOTE — Assessment & Plan Note (Signed)
Duplex today shows a maximal diameter of the aorta of 2.8 cm with a maximal diameter of the right common iliac artery at 1.4 cm and of the left common iliac artery at 1.5 cm.  This is unchanged from his study last year or the year before. At this point, with small iliac artery aneurysms I think we can go safely to an every other year follow-up.  He will contact our office with problems in the interim.

## 2018-09-12 NOTE — Progress Notes (Signed)
MRN : [id number]  Nathan Watts is a 73 y.o. (06/15/1945) male who presents with chief complaint of  Chief Complaint  Patient presents with  . Follow-up    ultrasound follow up  .  History of Present Illness: Patient returns today in follow up of his iliac artery aneurysms.  Doing well today without complaints.  No new problems or issues since his last visit.  No aneurysm related symptoms. Specifically, the patient denies new back or abdominal pain, or signs of peripheral embolization.  Duplex today shows a maximal diameter of the aorta of 2.8 cm with a maximal diameter of the right common iliac artery at 1.4 cm and of the left common iliac artery at 1.5 cm.  This is unchanged from his study last year or the year before.  Current Outpatient Medications  Medication Sig Dispense Refill  . aspirin 81 MG tablet Take 81 mg by mouth daily.    Marland Kitchen atorvastatin (LIPITOR) 10 MG tablet TAKE ONE TABLET EVERY DAY 90 tablet 1  . finasteride (PROSCAR) 5 MG tablet Take 1 tablet (5 mg total) by mouth daily. 90 tablet 3  . Lactobacillus (PROBIOTIC ACIDOPHILUS PO) Take by mouth daily.    . Multiple Vitamin (MULTI VITAMIN MENS PO) Take 1 tablet by mouth daily.    . Omega-3 Fatty Acids (FISH OIL) 1200 MG CAPS Take 1 capsule by mouth daily.    . Omeprazole Magnesium (PRILOSEC OTC PO) Take by mouth daily.    . vitamin B-12 (CYANOCOBALAMIN) 500 MCG tablet Take 500 mcg by mouth daily.     No current facility-administered medications for this visit.     Past Medical History:  Diagnosis Date  . Anemia, unspecified   . GERD (gastroesophageal reflux disease)   . Hypercholesterolemia   . Hyperglycemia   . Hypertension   . Thrombocytopenia ([place])   . Thyroid nodule     Past Surgical History:  Procedure Laterality Date  . COLONOSCOPY WITH PROPOFOL N/A 10/02/2014   Procedure: COLONOSCOPY WITH PROPOFOL;  Surgeon: Manya Silvas, MD;  Location: [facility] ENDOSCOPY;  Service: Endoscopy;  Laterality: N/A;  . INGUINAL  HERNIA REPAIR    . TONSILLECTOMY      Social History  Substance Use Topics  . Smoking status: Former Smoker    Packs/day: 1.00    Years: 30.00    Types: Cigarettes    Quit date: 04/12/1998  . Smokeless tobacco: Never Used  . Alcohol use No    Family History       Family History  Problem Relation Age of Onset  . Prostate cancer Father   . Heart disease Father     s/p CABG  . Stroke Mother   . Colon cancer Neg Hx          Allergies  Allergen Reactions  . Hydrocodone-Chlorpheniramine Other (See Comments)    Unknown, possible causes anxiety  . Crestor [Rosuvastatin] Rash    Causes GI upset and anxiety     REVIEW OF SYSTEMS(Negative unless checked)  Constitutional: [] ?Weight loss[] ?Fever[] ?Chills Cardiac:[] ?Chest pain[] ?Chest pressure[] ?Palpitations [] ?Shortness of breath when laying flat [] ?Shortness of breath at rest [] ?Shortness of breath with exertion. Vascular: [] ?Pain in legs with walking[] ?Pain in legsat rest[] ?Pain in legs when laying flat [] ?Claudication [] ?Pain in feet when walking [] ?Pain in feet at rest [] ?Pain in feet when laying flat [] ?History of DVT [] ?Phlebitis [] ?Swelling in legs [] ?Varicose veins [] ?Non-healing ulcers Pulmonary: [] ?Uses home oxygen [] ?Productive cough[] ?Hemoptysis [] ?Wheeze [] ?COPD [] ?Asthma Neurologic: [] ?Dizziness [] ?Blackouts [] ?Seizures [] ?History of stroke [] ?History of  TIA[] ?Aphasia [] ?Temporary blindness[] ?Dysphagia [] ?Weaknessor numbness in arms [] ?Weakness or numbnessin legs Musculoskeletal: [] ?Arthritis [] ?Joint swelling [] ?Joint pain [] ?Low back pain Hematologic:[] ?Easy bruising[] ?Easy bleeding [] ?Hypercoagulable state [] ?Anemic  Gastrointestinal:[] ?Blood in stool[] ?Vomiting blood[] ?Gastroesophageal reflux/heartburn[] ?Abdominal pain Genitourinary: [] ?Chronic kidney disease [] ?Difficulturination  [] ?Frequenturination [] ?Burning with urination[] ?Hematuria Skin: [] ?Rashes [] ?Ulcers [] ?Wounds Psychological: [] ?History of anxiety[] ?History of major depression.    Physical Examination  BP 128/79 (BP Location: Right Arm)   Pulse (!) 50   Resp 16   Ht 5\' 11"  (1.803 m)   Wt 190 lb 12.8 oz (86.5 kg)   BMI 26.61 kg/m  Gen:  WD/WN, NAD. Appears younger than stated age. Head: [place]/AT, No temporalis wasting. Ear/Nose/Throat: Hearing grossly intact, nares w/o erythema or drainage Eyes: Conjunctiva clear. Sclera non-icteric Neck: Supple.  Trachea midline Pulmonary:  Good air movement, no use of accessory muscles.  Cardiac: RRR, no JVD Vascular:  Vessel Right Left  Radial Palpable Palpable                                   Gastrointestinal: soft, non-tender/non-distended. No guarding/reflex.  Musculoskeletal: M/S 5/5 throughout.  No deformity or atrophy. No edema. Neurologic: Sensation grossly intact in extremities.  Symmetrical.  Speech is fluent.  Psychiatric: Judgment intact, Mood & affect appropriate for pt's clinical situation. Dermatologic: No rashes or ulcers noted.  No cellulitis or open wounds.       Labs No results found for this or any previous visit (from the past 2160 hour(s)).  Radiology No results found.  Assessment/Plan  Hypertension, essential blood pressure control important in reducing the progression of atherosclerotic disease and aneurysmal disease   Hypercholesterolemia lipid control important in reducing the progression of atherosclerotic disease. Continue statin therapy   Common iliac aneurysm (H[facility]) Duplex today shows a maximal diameter of the aorta of 2.8 cm with a maximal diameter of the right common iliac artery at 1.4 cm and of the left common iliac artery at 1.5 cm.  This is unchanged from his study last year or the year before. At this point, with small iliac artery aneurysms I think we can go safely to an every other  year follow-up.  He will contact our office with problems in the interim.    Leotis Pain, MD  09/12/2018 9:57 AM    This note was created with Dragon medical transcription system.  Any errors from dictation are purely unintentional

## 2018-09-12 NOTE — Assessment & Plan Note (Signed)
blood pressure control important in reducing the progression of atherosclerotic disease and aneurysmal disease

## 2018-09-12 NOTE — Assessment & Plan Note (Signed)
lipid control important in reducing the progression of atherosclerotic disease. Continue statin therapy  

## 2018-11-08 ENCOUNTER — Other Ambulatory Visit: Payer: Self-pay

## 2018-11-08 ENCOUNTER — Encounter: Payer: Self-pay | Admitting: Internal Medicine

## 2018-11-08 ENCOUNTER — Ambulatory Visit (INDEPENDENT_AMBULATORY_CARE_PROVIDER_SITE_OTHER): Payer: Medicare Other | Admitting: Internal Medicine

## 2018-11-08 DIAGNOSIS — I2510 Atherosclerotic heart disease of native coronary artery without angina pectoris: Secondary | ICD-10-CM

## 2018-11-08 DIAGNOSIS — I700 Atherosclerosis of aorta: Secondary | ICD-10-CM

## 2018-11-08 DIAGNOSIS — N401 Enlarged prostate with lower urinary tract symptoms: Secondary | ICD-10-CM

## 2018-11-08 DIAGNOSIS — I723 Aneurysm of iliac artery: Secondary | ICD-10-CM | POA: Diagnosis not present

## 2018-11-08 DIAGNOSIS — R739 Hyperglycemia, unspecified: Secondary | ICD-10-CM

## 2018-11-08 DIAGNOSIS — R319 Hematuria, unspecified: Secondary | ICD-10-CM | POA: Diagnosis not present

## 2018-11-08 DIAGNOSIS — N138 Other obstructive and reflux uropathy: Secondary | ICD-10-CM

## 2018-11-08 DIAGNOSIS — E7800 Pure hypercholesterolemia, unspecified: Secondary | ICD-10-CM

## 2018-11-08 DIAGNOSIS — Z125 Encounter for screening for malignant neoplasm of prostate: Secondary | ICD-10-CM

## 2018-11-08 NOTE — Assessment & Plan Note (Signed)
On finasteride.  Followed by urology.  Stable.

## 2018-11-08 NOTE — Assessment & Plan Note (Signed)
Evaluated by Dr Lucky Cowboy 09/12/18.  Stable.  Recommended f/u every other year.

## 2018-11-08 NOTE — Progress Notes (Signed)
Patient ID: Nathan Watts, male   DOB: 15-Feb-1946, 73 y.o.   MRN: [id number]   Virtual Visit via video Note  This visit type was conducted due to national recommendations for restrictions regarding the COVID-19 pandemic (e.g. social distancing).  This format is felt to be most appropriate for this patient at this time.  All issues noted in this document were discussed and addressed.  No physical exam was performed (except for noted visual exam findings with Video Visits).   I connected with Nathan Watts by a video enabled telemedicine application or telephone and verified that I am speaking with the correct person using two identifiers. Location patient: home Location provider: work  Persons participating in the virtual visit: patient, provider  I discussed the limitations, risks, security and privacy concerns of performing an evaluation and management service by video and the availability of in person appointments.  The patient expressed understanding and agreed to proceed.  Interactive audio and video telecommunications were attempted between this provider and patient, however failed, due to technical difficulties.  We converted the visit to a telephone visit.  We continued and completed visit with audio only.   Reason for visit: scheduled follow up.   HPI: Here for scheduled follow up.  States he is doing well.  Feels good.  Staying active.  Walking 1.5 hours/day.  No chest pain.  No sob.  Eating.  No acid reflux, nausea or vomiting.  Bowels moving.  Saw Dr Lucky Cowboy 09/12/18 for f/u common iliac aneurysm.  Stable.  Recommended f/u every other year.  Followed by urology for BPH.  On finasteride.  Stable.     ROS: See pertinent positives and negatives per HPI.  Past Medical History:  Diagnosis Date  . Anemia, unspecified   . GERD (gastroesophageal reflux disease)   . Hypercholesterolemia   . Hyperglycemia   . Hypertension   . Thrombocytopenia ([place])   . Thyroid nodule     Past Surgical  History:  Procedure Laterality Date  . COLONOSCOPY WITH PROPOFOL N/A 10/02/2014   Procedure: COLONOSCOPY WITH PROPOFOL;  Surgeon: Manya Silvas, MD;  Location: [facility] ENDOSCOPY;  Service: Endoscopy;  Laterality: N/A;  . INGUINAL HERNIA REPAIR    . TONSILLECTOMY      Family History  Problem Relation Age of Onset  . Prostate cancer Father   . Heart disease Father        s/p CABG  . Stroke Mother   . Colon cancer Neg Hx     SOCIAL HX: reviewed   Current Outpatient Medications:  .  aspirin 81 MG tablet, Take 81 mg by mouth daily., Disp: , Rfl:  .  atorvastatin (LIPITOR) 10 MG tablet, TAKE ONE TABLET EVERY DAY, Disp: 90 tablet, Rfl: 1 .  finasteride (PROSCAR) 5 MG tablet, Take 1 tablet (5 mg total) by mouth daily., Disp: 90 tablet, Rfl: 3 .  Lactobacillus (PROBIOTIC ACIDOPHILUS PO), Take by mouth daily., Disp: , Rfl:  .  Multiple Vitamin (MULTI VITAMIN MENS PO), Take 1 tablet by mouth daily., Disp: , Rfl:  .  Omega-3 Fatty Acids (FISH OIL) 1200 MG CAPS, Take 1 capsule by mouth daily., Disp: , Rfl:  .  Omeprazole Magnesium (PRILOSEC OTC PO), Take by mouth daily., Disp: , Rfl:  .  vitamin B-12 (CYANOCOBALAMIN) 500 MCG tablet, Take 500 mcg by mouth daily., Disp: , Rfl:   EXAM:  VITALS per patient if applicable: 062/69, weight 179 pounds  GENERAL: alert.  Sounds to be in no acute distress.  Answering questions appropriately.    PSYCH/NEURO: pleasant and cooperative, no obvious depression or anxiety, speech and thought processing grossly intact  ASSESSMENT AND PLAN:  Discussed the following assessment and plan:  Aortic atherosclerosis (HCC) On lipitor.   BPH with obstruction/lower urinary tract symptoms On finasteride.  Followed by urology.  Stable.   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  Stable.  Recommended f/u every other year.    Coronary artery disease Very active.  No chest pain with increased activity or exertion.  Continue risk factor modification.     Hypercholesterolemia Low cholesterol diet and exercise.  On lipitor.  Schedule for fasting lipid panel and liver panel.    Hyperglycemia Low carb diet and exercise.  Follow met b and a1c.      I discussed the assessment and treatment plan with the patient. The patient was provided an opportunity to ask questions and all were answered. The patient agreed with the plan and demonstrated an understanding of the instructions.   The patient was advised to call back or seek an in-person evaluation if the symptoms worsen or if the condition fails to improve as anticipated.  I provided 11 minutes of non-face-to-face time during this encounter.   Einar Pheasant, MD

## 2018-11-08 NOTE — Assessment & Plan Note (Signed)
Low cholesterol diet and exercise.  On lipitor.  Schedule for fasting lipid panel and liver panel.

## 2018-11-08 NOTE — Assessment & Plan Note (Signed)
Very active.  No chest pain with increased activity or exertion.  Continue risk factor modification.

## 2018-11-08 NOTE — Assessment & Plan Note (Signed)
Low carb diet and exercise.  Follow met b and a1c.   

## 2018-11-08 NOTE — Assessment & Plan Note (Signed)
On lipitor

## 2018-11-22 ENCOUNTER — Encounter: Payer: Self-pay | Admitting: Internal Medicine

## 2018-11-22 ENCOUNTER — Other Ambulatory Visit (INDEPENDENT_AMBULATORY_CARE_PROVIDER_SITE_OTHER): Payer: Medicare Other

## 2018-11-22 ENCOUNTER — Other Ambulatory Visit: Payer: Self-pay

## 2018-11-22 DIAGNOSIS — E7800 Pure hypercholesterolemia, unspecified: Secondary | ICD-10-CM

## 2018-11-22 DIAGNOSIS — R739 Hyperglycemia, unspecified: Secondary | ICD-10-CM | POA: Diagnosis not present

## 2018-11-22 DIAGNOSIS — Z125 Encounter for screening for malignant neoplasm of prostate: Secondary | ICD-10-CM

## 2018-11-22 DIAGNOSIS — R319 Hematuria, unspecified: Secondary | ICD-10-CM

## 2018-11-22 LAB — CBC WITH DIFFERENTIAL/PLATELET
Basophils Absolute: 0 10*3/uL (ref 0.0–0.1)
Basophils Relative: 0.6 % (ref 0.0–3.0)
Eosinophils Absolute: 0.2 10*3/uL (ref 0.0–0.7)
Eosinophils Relative: 3.4 % (ref 0.0–5.0)
HCT: 40.1 % (ref 39.0–52.0)
Hemoglobin: 13.2 g/dL (ref 13.0–17.0)
Lymphocytes Relative: 31.2 % (ref 12.0–46.0)
Lymphs Abs: 2.1 10*3/uL (ref 0.7–4.0)
MCHC: 33 g/dL (ref 30.0–36.0)
MCV: 89.9 fl (ref 78.0–100.0)
Monocytes Absolute: 0.6 10*3/uL (ref 0.1–1.0)
Monocytes Relative: 8.8 % (ref 3.0–12.0)
Neutro Abs: 3.8 10*3/uL (ref 1.4–7.7)
Neutrophils Relative %: 56 % (ref 43.0–77.0)
Platelets: 154 10*3/uL (ref 150.0–400.0)
RBC: 4.45 Mil/uL (ref 4.22–5.81)
RDW: 13.9 % (ref 11.5–15.5)
WBC: 6.7 10*3/uL (ref 4.0–10.5)

## 2018-11-22 LAB — URINALYSIS, ROUTINE W REFLEX MICROSCOPIC
Bilirubin Urine: NEGATIVE
Hgb urine dipstick: NEGATIVE
Ketones, ur: NEGATIVE
Nitrite: NEGATIVE
Specific Gravity, Urine: >=1.03 — AB (ref 1.000–1.030)
Total Protein, Urine: NEGATIVE
Urine Glucose: NEGATIVE
Urobilinogen, UA: 0.2 (ref 0.0–1.0)
pH: 5.5 (ref 5.0–8.0)

## 2018-11-22 LAB — BASIC METABOLIC PANEL
BUN: 15 mg/dL (ref 6–23)
CO2: 28 mEq/L (ref 19–32)
Calcium: 9.2 mg/dL (ref 8.4–10.5)
Chloride: 106 mEq/L (ref 96–112)
Creatinine, Ser: 0.99 mg/dL (ref 0.40–1.50)
GFR: 73.85 mL/min (ref >60.00)
Glucose, Bld: 97 mg/dL (ref 70–99)
Potassium: 4.4 mEq/L (ref 3.5–5.1)
Sodium: 140 mEq/L (ref 135–145)

## 2018-11-22 LAB — HEPATIC FUNCTION PANEL
ALT: 13 U/L (ref 0–53)
AST: 17 U/L (ref 0–37)
Albumin: 4 g/dL (ref 3.5–5.2)
Alkaline Phosphatase: 48 U/L (ref 39–117)
Bilirubin, Direct: 0.2 mg/dL (ref 0.0–0.3)
Total Bilirubin: 0.7 mg/dL (ref 0.2–1.2)
Total Protein: 6.4 g/dL (ref 6.0–8.3)

## 2018-11-22 LAB — LIPID PANEL
Cholesterol: 138 mg/dL (ref 0–200)
HDL: 48 mg/dL (ref >39.00)
LDL Cholesterol: 78 mg/dL (ref 0–99)
NonHDL: 89.91
Total CHOL/HDL Ratio: 3
Triglycerides: 62 mg/dL (ref 0.0–149.0)
VLDL: 12.4 mg/dL (ref 0.0–40.0)

## 2018-11-22 LAB — PSA, MEDICARE: PSA: 2.6 ng/ml (ref 0.10–4.00)

## 2018-11-22 LAB — HEMOGLOBIN A1C: Hgb A1c MFr Bld: 5.8 % (ref 4.6–6.5)

## 2018-12-12 ENCOUNTER — Other Ambulatory Visit: Payer: Self-pay

## 2018-12-12 ENCOUNTER — Ambulatory Visit (INDEPENDENT_AMBULATORY_CARE_PROVIDER_SITE_OTHER): Payer: Medicare Other

## 2018-12-12 DIAGNOSIS — Z23 Encounter for immunization: Secondary | ICD-10-CM

## 2018-12-13 ENCOUNTER — Ambulatory Visit: Payer: Medicare Other | Admitting: Internal Medicine

## 2019-02-01 ENCOUNTER — Other Ambulatory Visit: Payer: Self-pay

## 2019-02-01 ENCOUNTER — Ambulatory Visit (INDEPENDENT_AMBULATORY_CARE_PROVIDER_SITE_OTHER): Payer: Medicare Other

## 2019-02-01 DIAGNOSIS — Z0000 Encounter for general adult medical examination without abnormal findings: Secondary | ICD-10-CM

## 2019-02-01 NOTE — Patient Instructions (Addendum)
  Nathan Watts , Thank you for taking time to come for your Medicare Wellness Visit. I appreciate your ongoing commitment to your health goals. Please review the following plan we discussed and let me know if I can assist you in the future.   These are the goals we discussed: Goals    . Follow up with Primary Care Provider     As needed       This is a list of the screening recommended for you and due dates:  Health Maintenance  Topic Date Due  . Tetanus Vaccine  09/09/1963  . Colon Cancer Screening  10/01/2024  . Flu Shot  Completed  .  Hepatitis C: One time screening is recommended by Center for Disease Control  (CDC) for  adults born from [birth year] through 1965.   Completed  . Pneumonia vaccines  Completed

## 2019-02-01 NOTE — Progress Notes (Addendum)
Subjective:   Nathan Watts is a 73 y.o. male who presents for an Initial Medicare Annual Wellness Visit.  Review of Systems  No ROS.  Medicare Wellness Virtual Visit.  Visual/audio telehealth visit, UTA vital signs.   See social history for additional risk factors.   Cardiac Risk Factors include: advanced age (>83men, >44 women);hypertension    Objective:    Today's Vitals   There is no height or weight on file to calculate BMI.  Advanced Directives 02/01/2019 08/03/2016 04/20/2016 06/04/2015  Does Patient Have a Medical Advance Directive? Yes Yes Yes No  Type of Paramedic of [place];Living will [place];Living will Living will -  Does patient want to make changes to medical advance directive? No - Patient declined - No - Patient declined -  Copy of [place] in Chart? No - copy requested - - -  Would patient like information on creating a medical advance directive? - - - No - patient declined information    Current Medications (verified) Outpatient Encounter Medications as of 02/01/2019  Medication Sig  . aspirin 81 MG tablet Take 81 mg by mouth daily.  Marland Kitchen atorvastatin (LIPITOR) 10 MG tablet TAKE ONE TABLET EVERY DAY  . finasteride (PROSCAR) 5 MG tablet Take 1 tablet (5 mg total) by mouth daily.  . Lactobacillus (PROBIOTIC ACIDOPHILUS PO) Take by mouth daily.  . Multiple Vitamin (MULTI VITAMIN MENS PO) Take 1 tablet by mouth daily.  . Omega-3 Fatty Acids (FISH OIL) 1200 MG CAPS Take 1 capsule by mouth daily.  . Omeprazole Magnesium (PRILOSEC OTC PO) Take by mouth daily.  . vitamin B-12 (CYANOCOBALAMIN) 500 MCG tablet Take 500 mcg by mouth daily.   No facility-administered encounter medications on file as of 02/01/2019.     Allergies (verified) Hydrocodone-chlorpheniramine and Crestor [rosuvastatin]   History: Past Medical History:  Diagnosis Date  . Anemia, unspecified   . GERD (gastroesophageal reflux  disease)   . Hypercholesterolemia   . Hyperglycemia   . Hypertension   . Thrombocytopenia ([place])   . Thyroid nodule    Past Surgical History:  Procedure Laterality Date  . COLONOSCOPY WITH PROPOFOL N/A 10/02/2014   Procedure: COLONOSCOPY WITH PROPOFOL;  Surgeon: Manya Silvas, MD;  Location: [facility] ENDOSCOPY;  Service: Endoscopy;  Laterality: N/A;  . INGUINAL HERNIA REPAIR    . TONSILLECTOMY     Family History  Problem Relation Age of Onset  . Prostate cancer Father   . Heart disease Father        s/p CABG  . Stroke Mother   . Colon cancer Neg Hx    Social History   Socioeconomic History  . Marital status: Married    Spouse name: Not on file  . Number of children: 4  . Years of education: Not on file  . Highest education level: Not on file  Occupational History  . Occupation: retired  Scientific laboratory technician  . Financial resource strain: Not hard at all  . Food insecurity    Worry: Never true    Inability: Never true  . Transportation needs    Medical: No    Non-medical: No  Tobacco Use  . Smoking status: Former Smoker    Packs/day: 1.00    Years: 30.00    Pack years: 30.00    Types: Cigarettes    Quit date: 04/12/1998    Years since quitting: 20.8  . Smokeless tobacco: Former Systems developer    Quit date: 04/12/1999  Substance  and Sexual Activity  . Alcohol use: Yes    Alcohol/week: 0.0 standard drinks    Comment: OCC  . Drug use: Not on file  . Sexual activity: Never  Lifestyle  . Physical activity    Days per week: 7 days    Minutes per session: 120 min  . Stress: Not at all  Relationships  . Social Herbalist on phone: Not on file    Gets together: Not on file    Attends religious service: Not on file    Active member of club or organization: Not on file    Attends meetings of clubs or organizations: Not on file    Relationship status: Not on file  Other Topics Concern  . Not on file  Social History Narrative   He is married. Has four children   Tobacco  Counseling Counseling given: Not Answered   Clinical Intake:  Pre-visit preparation completed: Yes        Diabetes: No  How often do you need to have someone help you when you read instructions, pamphlets, or other written materials from your doctor or pharmacy?: 1 - Never  Interpreter Needed?: No     Activities of Daily Living In your present state of health, do you have any difficulty performing the following activities: 02/01/2019  Hearing? N  Vision? N  Difficulty concentrating or making decisions? N  Walking or climbing stairs? N  Dressing or bathing? N  Doing errands, shopping? N  Preparing Food and eating ? N  Using the Toilet? N  In the past six months, have you accidently leaked urine? N  Do you have problems with loss of bowel control? N  Managing your Medications? N  Managing your Finances? N  Housekeeping or managing your Housekeeping? N  Some recent data might be hidden     Immunizations and Health Maintenance Immunization History  Administered Date(s) Administered  . Fluad Quad(high Dose 65+) 12/12/2018  . Influenza Split 01/28/2012  . Influenza, High Dose Seasonal PF 01/24/2016, 02/04/2017, 01/16/2018  . Influenza,inj,Quad PF,6+ Mos 12/28/2012, 01/05/2014, 01/06/2015  . Pneumococcal Conjugate-13 01/23/2014  . Pneumococcal Polysaccharide-23 03/14/2012  . Zoster Recombinat (Shingrix) 11/02/2016, 03/09/2017   Health Maintenance Due  Topic Date Due  . Samul Dada  09/09/1964    Patient Care Team: Einar Pheasant, MD as PCP - General (Internal Medicine)  Indicate any recent Medical Services you may have received from other than [facility] providers in the past year (date may be approximate).    Assessment:   This is a routine wellness examination for [place].  Nurse connected with patient 02/01/19 at  8:30 AM EDT by a phone enabled telemedicine application and verified that I am speaking with the correct person using two identifiers. Patient stated  full name and DOB. Patient gave permission to continue with virtual visit. Patient's location was at home and Nurse's location was at [place] office.   Health Maintenance Due: -Tdap- discussed; to be completed with doctor in visit or local pharmacy.   Update all pending maintenance due as appropriate.   See completed HM at the end of note.   Eye: Visual acuity not assessed. Virtual visit. Wears corrective lenses. Followed by their ophthalmologist every 12 months.   Dental: Visits every 6 months.    Hearing: Demonstrates normal hearing during visit.  Safety:  Patient feels safe at home- yes Patient does have smoke detectors at home- yes Patient does wear sunscreen or protective clothing when in direct sunlight -  yes Patient does wear seat belt when in a moving vehicle - yes Patient drives- yes Adequate lighting in walkways free from debris- yes Grab bars and handrails used as appropriate- yes Ambulates with no assistive device   Social: Alcohol intake - yes      Smoking history- former   Smokers in home? none Illicit drug use? none  Depression: PHQ 2 &9 complete. See screening below. Denies irritability, anhedonia, sadness/tearfullness.  Stable.   Falls: See screening below.    Medication: Taking as directed and without issues.   Covid-19: Precautions and sickness symptoms discussed. Wears mask, social distancing, hand hygiene as appropriate.   Activities of Daily Living Patient denies needing assistance with: household chores, feeding themselves, getting from bed to chair, getting to the toilet, bathing/showering, dressing, managing money, or preparing meals.   Memory: Patient is alert. Patient denies difficulty focusing or concentrating. Correctly identified the president of the [place], season and recall. Patient likes to read and completes puzzles for brain stimulation.  BMI- discussed the importance of a healthy diet, water intake and the benefits of aerobic  exercise.  Educational material provided.  Physical activity-  Walking 6-7 miles daily, 120 minutes  Diet: healthy/regular with portion control Water: good intake Caffeine: no  Other Providers Patient Care Team: Einar Pheasant, MD as PCP - General (Internal Medicine)  Hearing/Vision screen  Hearing Screening   125Hz  250Hz  500Hz  1000Hz  2000Hz  3000Hz  4000Hz  6000Hz  8000Hz   Right ear:           Left ear:           Comments: Patient is able to hear conversational tones without difficulty.  No issues reported.  Vision Screening Comments: Wears corrective lenses Visual acuity not assessed, virtual visit.  They have seen their ophthalmologist in the last 12 months.     Dietary issues and exercise activities discussed: Current Exercise Habits: Home exercise routine, Type of exercise: walking, Time (Minutes): > 60, Frequency (Times/Week): 7, Weekly Exercise (Minutes/Week): 0, Intensity: Moderate  Goals    . Follow up with Primary Care Provider     As needed      Depression Screen PHQ 2/9 Scores 02/01/2019 11/08/2018 05/03/2017 04/20/2016  PHQ - 2 Score 0 0 0 0    Fall Risk Fall Risk  02/01/2019 04/20/2016 01/06/2015 07/03/2013 07/03/2012  Falls in the past year? 0 No No Yes No  Number falls in past yr: - - - 1 -  Injury with Fall? - - - Yes -  Comment - - - Shoulder -   Timed Get Up and Go performed: no, virtual visit  Cognitive Function:     6CIT Screen 02/01/2019  What Year? 0 points  What month? 0 points  What time? 0 points  Count back from 20 0 points  Months in reverse 0 points  Repeat phrase 0 points  Total Score 0    Screening Tests Health Maintenance  Topic Date Due  . TETANUS/TDAP  09/09/1964  . COLONOSCOPY  10/01/2024  . INFLUENZA VACCINE  Completed  . Hepatitis C Screening  Completed  . PNA vac Low Risk Adult  Completed       Plan:   Keep all routine maintenance appointments.   Follow up 05/15/19  Medicare Attestation I have personally reviewed:  The patient's medical and social history Their use of alcohol, tobacco or illicit drugs Their current medications and supplements The patient's functional ability including ADLs,fall risks, home safety risks, cognitive, and hearing and visual impairment Diet and  physical activities Evidence for depression   In addition, I have reviewed and discussed with patient certain preventive protocols, quality metrics, and best practice recommendations. A written personalized care plan for preventive services as well as general preventive health recommendations were provided to patient via mail.     Varney Biles, LPN   [id number]    Reviewed above information.  Agree with assessment and plan.    Dr Nicki Reaper

## 2019-02-05 ENCOUNTER — Other Ambulatory Visit: Payer: Self-pay

## 2019-02-05 DIAGNOSIS — Z20822 Contact with and (suspected) exposure to covid-19: Secondary | ICD-10-CM

## 2019-02-06 LAB — NOVEL CORONAVIRUS, NAA: SARS-CoV-2, NAA: NOT DETECTED

## 2019-02-12 ENCOUNTER — Other Ambulatory Visit: Payer: Self-pay | Admitting: Internal Medicine

## 2019-02-14 DIAGNOSIS — H2513 Age-related nuclear cataract, bilateral: Secondary | ICD-10-CM | POA: Diagnosis not present

## 2019-03-30 ENCOUNTER — Ambulatory Visit: Payer: Medicare Other | Admitting: Urology

## 2019-03-30 ENCOUNTER — Encounter: Payer: Self-pay | Admitting: Urology

## 2019-03-30 ENCOUNTER — Other Ambulatory Visit: Payer: Self-pay

## 2019-03-30 VITALS — BP 148/83 | HR 54 | Ht 71.0 in | Wt 175.0 lb

## 2019-03-30 DIAGNOSIS — N138 Other obstructive and reflux uropathy: Secondary | ICD-10-CM

## 2019-03-30 DIAGNOSIS — N401 Enlarged prostate with lower urinary tract symptoms: Secondary | ICD-10-CM | POA: Diagnosis not present

## 2019-03-30 DIAGNOSIS — R972 Elevated prostate specific antigen [PSA]: Secondary | ICD-10-CM | POA: Diagnosis not present

## 2019-03-30 MED ORDER — FINASTERIDE 5 MG PO TABS: 5.0000 mg | ORAL_TABLET | Freq: Every day | ORAL | 3 refills | Status: DC

## 2019-03-30 NOTE — Progress Notes (Signed)
03/30/2019 9:23 AM   Nathan Watts 1945/11/30 [id number]  Referring provider: Einar Pheasant, [id number] [place],  [place] [id number]  Chief Complaint  Patient presents with  . Follow-up    Urologic History 1. Benign Prostatic Hypertrophy             - On finasteride  2. History of Elevated PSA             - One prior PA slightly elevated, returned to baseline             - Prostate Biopsy (04/2015); PSA 4.25; 97 gram prostate; benign pathology   HPI: 73 y.o. male presents for annual follow-up.  He has stable lower urinary tract symptoms and remains on finasteride.  He has occasional intermittent urinary stream and nocturia x3.  IPSS completed today was 8/35 with a quality of life rated 2/6.  Denies dysuria or gross hematuria.   Uncorrected PSA performed by PCP August 2020 slightly above baseline at 2.6.   PMH: Past Medical History:  Diagnosis Date  . Anemia, unspecified   . GERD (gastroesophageal reflux disease)   . Hypercholesterolemia   . Hyperglycemia   . Hypertension   . Thrombocytopenia ([place])   . Thyroid nodule     Surgical History: Past Surgical History:  Procedure Laterality Date  . COLONOSCOPY WITH PROPOFOL N/A 10/02/2014   Procedure: COLONOSCOPY WITH PROPOFOL;  Surgeon: Manya Silvas, MD;  [facility] ENDOSCOPY;  Service: Endoscopy;  Laterality: N/A;  . INGUINAL HERNIA REPAIR    . TONSILLECTOMY      Home Medications:  Allergies as of 03/30/2019      Reactions   Hydrocodone-chlorpheniramine Other (See Comments)   Unknown, possible causes anxiety  possible causes anxiety Unknown, possible causes anxiety   Crestor [rosuvastatin] Rash   Causes GI upset and anxiety      Medication List       Accurate as of March 30, 2019  9:23 AM. If you have any questions, ask your nurse or doctor.        aspirin 81 MG tablet Take 81 mg by mouth daily.   atorvastatin 10 MG tablet Commonly known as: LIPITOR TAKE 1 TABLET  BY MOUTH DAILY   finasteride 5 MG tablet Commonly known as: PROSCAR Take 1 tablet (5 mg total) by mouth daily.   Fish Oil 1200 MG Caps Take 1 capsule by mouth daily.   MULTI VITAMIN MENS PO Take 1 tablet by mouth daily.   PRILOSEC OTC PO Take by mouth daily.   PROBIOTIC ACIDOPHILUS PO Take by mouth daily.   vitamin B-12 500 MCG tablet Commonly known as: CYANOCOBALAMIN Take 500 mcg by mouth daily.       Allergies:  Allergies  Allergen Reactions  . Hydrocodone-Chlorpheniramine Other (See Comments)    Unknown, possible causes anxiety  possible causes anxiety Unknown, possible causes anxiety  . Crestor [Rosuvastatin] Rash    Causes GI upset and anxiety    Family History: Family History  Problem Relation Age of Onset  . Prostate cancer Father   . Heart disease Father        s/p CABG  . Stroke Mother   . Colon cancer Neg Hx     Social History:  reports that he quit smoking about 20 years ago. His smoking use included cigarettes. He has a 30.00 pack-year smoking history. He quit smokeless tobacco use about 19 years ago. He reports current alcohol use. No history on file for  drug.  ROS: UROLOGY Frequent Urination?: Yes Hard to postpone urination?: No Burning/pain with urination?: No Get up at night to urinate?: Yes Leakage of urine?: No Urine stream starts and stops?: No Trouble starting stream?: Yes Do you have to strain to urinate?: No Blood in urine?: No Urinary tract infection?: No Sexually transmitted disease?: No Injury to kidneys or bladder?: No Painful intercourse?: No Weak stream?: No Erection problems?: No Penile pain?: No  Gastrointestinal Nausea?: No Vomiting?: No Indigestion/heartburn?: No Diarrhea?: No Constipation?: No  Constitutional Fever: No Night sweats?: No Weight loss?: No Fatigue?: No  Skin Skin rash/lesions?: No Itching?: No  Eyes Blurred vision?: No Double vision?: No  Ears/Nose/Throat Sore throat?: No Sinus  problems?: No  Hematologic/Lymphatic Swollen glands?: No Easy bruising?: No  Cardiovascular Leg swelling?: No Chest pain?: No  Respiratory Cough?: No Shortness of breath?: No  Endocrine Excessive thirst?: No  Musculoskeletal Back pain?: No Joint pain?: No  Neurological Headaches?: No Dizziness?: No  Psychologic Depression?: No Anxiety?: No  Physical Exam: BP (!) 148/83   Pulse (!) 54   Ht 5\' 11"  (1.803 m)   Wt 175 lb (79.4 kg)   BMI 24.41 kg/m   Constitutional:  Alert and oriented, No acute distress. HEENT: [place] AT, moist mucus membranes.  Trachea midline, no masses. Cardiovascular: No clubbing, cyanosis, or edema. Respiratory: Normal respiratory effort, no increased work of breathing. GU: Prostate 60+ cc, smooth without nodules Neurologic: Grossly intact, no focal deficits, moving all 4 extremities. Psychiatric: Normal mood and affect.   Assessment & Plan:    - BPH with lower urinary tract symptoms Stable voiding symptoms on finasteride.  Refill sent to pharmacy.  - Elevated PSA PSA August 2020 slightly above baseline.  Repeated today and he will be notified with results.  If stable follow-up 1 year.   Abbie Sons, [facility] [address], [place], [place] [address] [phone]

## 2019-03-31 LAB — PSA: Prostate Specific Ag, Serum: 1.9 ng/mL (ref 0.0–4.0)

## 2019-04-02 ENCOUNTER — Telehealth: Payer: Self-pay | Admitting: *Deleted

## 2019-04-02 NOTE — Telephone Encounter (Addendum)
Patient informed-verbalized understanding.   ----- Message from Abbie Sons, MD sent at 04/01/2019  9:42 AM EST ----- PSA level looks good at 1.9

## 2019-04-10 ENCOUNTER — Encounter: Payer: Self-pay | Admitting: Internal Medicine

## 2019-05-12 ENCOUNTER — Other Ambulatory Visit: Payer: Self-pay | Admitting: Internal Medicine

## 2019-05-15 ENCOUNTER — Ambulatory Visit (INDEPENDENT_AMBULATORY_CARE_PROVIDER_SITE_OTHER): Payer: Medicare Other | Admitting: Internal Medicine

## 2019-05-15 ENCOUNTER — Encounter: Payer: Self-pay | Admitting: Internal Medicine

## 2019-05-15 ENCOUNTER — Other Ambulatory Visit: Payer: Self-pay

## 2019-05-15 VITALS — BP 134/68 | HR 51 | Temp 95.6°F | Resp 14 | Ht 71.0 in | Wt 179.2 lb

## 2019-05-15 DIAGNOSIS — R739 Hyperglycemia, unspecified: Secondary | ICD-10-CM | POA: Diagnosis not present

## 2019-05-15 DIAGNOSIS — I700 Atherosclerosis of aorta: Secondary | ICD-10-CM

## 2019-05-15 DIAGNOSIS — I723 Aneurysm of iliac artery: Secondary | ICD-10-CM

## 2019-05-15 DIAGNOSIS — E7800 Pure hypercholesterolemia, unspecified: Secondary | ICD-10-CM

## 2019-05-15 DIAGNOSIS — Z0000 Encounter for general adult medical examination without abnormal findings: Secondary | ICD-10-CM | POA: Diagnosis not present

## 2019-05-15 DIAGNOSIS — I10 Essential (primary) hypertension: Secondary | ICD-10-CM

## 2019-05-15 DIAGNOSIS — I2510 Atherosclerotic heart disease of native coronary artery without angina pectoris: Secondary | ICD-10-CM

## 2019-05-15 DIAGNOSIS — N401 Enlarged prostate with lower urinary tract symptoms: Secondary | ICD-10-CM

## 2019-05-15 DIAGNOSIS — N138 Other obstructive and reflux uropathy: Secondary | ICD-10-CM

## 2019-05-15 DIAGNOSIS — E041 Nontoxic single thyroid nodule: Secondary | ICD-10-CM

## 2019-05-15 DIAGNOSIS — R198 Other specified symptoms and signs involving the digestive system and abdomen: Secondary | ICD-10-CM

## 2019-05-15 LAB — BASIC METABOLIC PANEL
BUN: 12 mg/dL (ref 6–23)
CO2: 31 mEq/L (ref 19–32)
Calcium: 9.4 mg/dL (ref 8.4–10.5)
Chloride: 103 mEq/L (ref 96–112)
Creatinine, Ser: 1.04 mg/dL (ref 0.40–1.50)
GFR: 69.68 mL/min (ref >60.00)
Glucose, Bld: 99 mg/dL (ref 70–99)
Potassium: 4.4 mEq/L (ref 3.5–5.1)
Sodium: 138 mEq/L (ref 135–145)

## 2019-05-15 LAB — LIPID PANEL
Cholesterol: 153 mg/dL (ref 0–200)
HDL: 46.8 mg/dL (ref >39.00)
LDL Cholesterol: 92 mg/dL (ref 0–99)
NonHDL: 105.75
Total CHOL/HDL Ratio: 3
Triglycerides: 71 mg/dL (ref 0.0–149.0)
VLDL: 14.2 mg/dL (ref 0.0–40.0)

## 2019-05-15 LAB — TSH: TSH: 1.21 u[IU]/mL (ref 0.35–4.50)

## 2019-05-15 LAB — HEPATIC FUNCTION PANEL
ALT: 20 U/L (ref 0–53)
AST: 18 U/L (ref 0–37)
Albumin: 4.2 g/dL (ref 3.5–5.2)
Alkaline Phosphatase: 51 U/L (ref 39–117)
Bilirubin, Direct: 0.1 mg/dL (ref 0.0–0.3)
Total Bilirubin: 0.6 mg/dL (ref 0.2–1.2)
Total Protein: 6.9 g/dL (ref 6.0–8.3)

## 2019-05-15 LAB — HEMOGLOBIN A1C: Hgb A1c MFr Bld: 5.8 % (ref 4.6–6.5)

## 2019-05-15 NOTE — Assessment & Plan Note (Signed)
Physical today 05/15/19.  Colonoscopy 10/02/14 - internal hemorrhoids otherwise normal.  Recommended f/u colonoscopy in 10 years.  02/20/19 - psa - 1.7.

## 2019-05-15 NOTE — Progress Notes (Signed)
Patient ID: Isaiah Cianci, male   DOB: 1945-09-17, 74 y.o.   MRN: [id number]   Subjective:    Patient ID: [name], male    DOB: 01-28-1946, 74 y.o.   MRN: [id number]  HPI This visit occurred during the SARS-CoV-2 public health emergency.  Safety protocols were in place, including screening questions prior to the visit, additional usage of staff PPE, and extensive cleaning of exam room while observing appropriate contact time as indicated for disinfecting solutions.  Patient here for his physical exam.  He reports he is doing well.  Feels good.  Is still walking.  No chest pain. No sob.  No acid reflux.  No abdominal pain.  Bowels are moving.  Has noticed some stool change when he wipes.  Increased gas.  Discussed adding fiber.  Saw Dr Lucky Cowboy 09/22/18 - follow up iliac artery aneurysm.  Stable.  Recommended f/u every other year.  On finasteride - BPH.  Watching diet.  Overall feels good.     Past Medical History:  Diagnosis Date  . Anemia, unspecified   . GERD (gastroesophageal reflux disease)   . Hypercholesterolemia   . Hyperglycemia   . Hypertension   . Thrombocytopenia ([place])   . Thyroid nodule    Past Surgical History:  Procedure Laterality Date  . COLONOSCOPY WITH PROPOFOL N/A 10/02/2014   Procedure: COLONOSCOPY WITH PROPOFOL;  Surgeon: Manya Silvas, MD;  Location: [facility] ENDOSCOPY;  Service: Endoscopy;  Laterality: N/A;  . INGUINAL HERNIA REPAIR    . TONSILLECTOMY     Family History  Problem Relation Age of Onset  . Prostate cancer Father   . Heart disease Father        s/p CABG  . Stroke Mother   . Colon cancer Neg Hx    Social History   Socioeconomic History  . Marital status: Married    Spouse name: Not on file  . Number of children: 4  . Years of education: Not on file  . Highest education level: Not on file  Occupational History  . Occupation: retired  Tobacco Use  . Smoking status: Former Smoker    Packs/day: 1.00    Years: 30.00    Pack years: 30.00   Types: Cigarettes    Quit date: 04/12/1998    Years since quitting: 21.1  . Smokeless tobacco: Former Systems developer    Quit date: 04/12/1999  Substance and Sexual Activity  . Alcohol use: Yes    Alcohol/week: 0.0 standard drinks    Comment: OCC  . Drug use: Not on file  . Sexual activity: Never  Other Topics Concern  . Not on file  Social History Narrative   He is married. Has four children   Social Determinants of Health   Financial Resource Strain: Low Risk   . Difficulty of Paying Living Expenses: Not hard at all  Food Insecurity: No Food Insecurity  . Worried About Charity fundraiser in the Last Year: Never true  . Ran Out of Food in the Last Year: Never true  Transportation Needs: No Transportation Needs  . Lack of Transportation (Medical): No  . Lack of Transportation (Non-Medical): No  Physical Activity: Sufficiently Active  . Days of Exercise per Week: 7 days  . Minutes of Exercise per Session: 120 min  Stress: No Stress Concern Present  . Feeling of Stress : Not at all  Social Connections:   . Frequency of Communication with Friends and Family: Not on file  . Frequency of Social Gatherings  with Friends and Family: Not on file  . Attends Religious Services: Not on file  . Active Member of Clubs or Organizations: Not on file  . Attends Archivist Meetings: Not on file  . Marital Status: Not on file    Outpatient Encounter Medications as of 05/15/2019  Medication Sig  . aspirin 81 MG tablet Take 81 mg by mouth daily.  Marland Kitchen atorvastatin (LIPITOR) 10 MG tablet TAKE 1 TABLET BY MOUTH DAILY  . finasteride (PROSCAR) 5 MG tablet Take 1 tablet (5 mg total) by mouth daily.  . Lactobacillus (PROBIOTIC ACIDOPHILUS PO) Take by mouth daily.  . metroNIDAZOLE (METROGEL) 0.75 % gel Apply topically 2 (two) times daily. to face  . Multiple Vitamin (MULTI VITAMIN MENS PO) Take 1 tablet by mouth daily.  . Omega-3 Fatty Acids (FISH OIL) 1200 MG CAPS Take 1 capsule by mouth daily.  .  Omeprazole Magnesium (PRILOSEC OTC PO) Take by mouth daily.  . pimecrolimus (ELIDEL) 1 % cream APPLY TWICE DAILY AS NEEDED FOR FLARES  . vitamin B-12 (CYANOCOBALAMIN) 500 MCG tablet Take 500 mcg by mouth daily.   No facility-administered encounter medications on file as of 05/15/2019.    Review of Systems  Constitutional: Negative for appetite change and unexpected weight change.  HENT: Negative for congestion and sinus pressure.   Eyes: Negative for pain and visual disturbance.  Respiratory: Negative for cough, chest tightness and shortness of breath.   Cardiovascular: Negative for chest pain, palpitations and leg swelling.  Gastrointestinal: Negative for abdominal pain, diarrhea, nausea and vomiting.  Genitourinary: Negative for difficulty urinating and dysuria.  Musculoskeletal: Negative for joint swelling and myalgias.  Skin: Negative for color change and rash.  Neurological: Negative for dizziness, light-headedness and headaches.  Hematological: Negative for adenopathy. Does not bruise/bleed easily.  Psychiatric/Behavioral: Negative for agitation and dysphoric mood.       Objective:    Physical Exam Constitutional:      General: He is not in acute distress.    Appearance: Normal appearance. He is well-developed.  HENT:     Head: Normocephalic and atraumatic.     Right Ear: External ear normal.     Left Ear: External ear normal.  Eyes:     General: No scleral icterus.       Right eye: No discharge.        Left eye: No discharge.     Conjunctiva/sclera: Conjunctivae normal.  Neck:     Thyroid: No thyromegaly.     Comments: Palpable thyroid nodule - stable.  Cardiovascular:     Rate and Rhythm: Normal rate and regular rhythm.  Pulmonary:     Effort: No respiratory distress.     Breath sounds: Normal breath sounds. No wheezing.  Abdominal:     General: Bowel sounds are normal.     Palpations: Abdomen is soft.     Tenderness: There is no abdominal tenderness.    Genitourinary:    Comments: Rectal exam - normal sphincter tone.  Heme negative.  Musculoskeletal:        General: No swelling or tenderness.     Cervical back: Neck supple. No tenderness.  Lymphadenopathy:     Cervical: No cervical adenopathy.  Skin:    Findings: No erythema or rash.  Neurological:     Mental Status: He is alert and oriented to person, place, and time.  Psychiatric:        Mood and Affect: Mood normal.        Behavior:  Behavior normal.     BP 134/68 (BP Location: Left Arm, Patient Position: Sitting, Cuff Size: Normal)   Pulse (!) 51   Temp (!) 95.6 F (35.3 C) (Temporal)   Resp 14   Ht '5\' 11"'  (1.803 m)   Wt 179 lb 3.2 oz (81.3 kg)   SpO2 99%   BMI 24.99 kg/m  Wt Readings from Last 3 Encounters:  05/15/19 179 lb 3.2 oz (81.3 kg)  03/30/19 175 lb (79.4 kg)  09/12/18 190 lb 12.8 oz (86.5 kg)     Lab Results  Component Value Date   WBC 6.7 11/22/2018   HGB 13.2 11/22/2018   HCT 40.1 11/22/2018   PLT 154.0 11/22/2018   GLUCOSE 99 05/15/2019   CHOL 153 05/15/2019   TRIG 71.0 05/15/2019   HDL 46.80 05/15/2019   LDLCALC 92 05/15/2019   ALT 20 05/15/2019   AST 18 05/15/2019   NA 138 05/15/2019   K 4.4 05/15/2019   CL 103 05/15/2019   CREATININE 1.04 05/15/2019   BUN 12 05/15/2019   CO2 31 05/15/2019   TSH 1.21 05/15/2019   PSA 2.60 11/22/2018   HGBA1C 5.8 05/15/2019    DG Finger Middle Right  Result Date: 10/07/2016 CLINICAL DATA:  Finger laceration this morning. Injury to the region of the distal EXAM: RIGHT MIDDLE FINGER 2+V COMPARISON:  Down FINDINGS: There is no evidence of fracture or dislocation. There is no evidence of arthropathy or other focal bone abnormality. Soft tissue defect noted distal finger. No evidence for underlying retained radiopaque soft tissue foreign body. IMPRESSION: No acute bony findings. Electronically Signed   By: Misty Stanley M.D.   On: 10/07/2016 12:33       Assessment & Plan:   Problem List Items Addressed  This Visit    Aortic atherosclerosis ([place])    On lipitor.        BPH with obstruction/lower urinary tract symptoms    On finasteride.  Followed by urology.  Stable.        Change in bowel movement    Bowel change as outlined.  Add fiber.  Follow closely.  Call with update.        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  Stable.  Recommended f/u every other year.        Coronary artery disease    No chest pain with increased activity or exertion.  Continue risk factor modification.       Health care maintenance    Physical today 05/15/19.  Colonoscopy 10/02/14 - internal hemorrhoids otherwise normal.  Recommended f/u colonoscopy in 10 years.  02/20/19 - psa - 1.7.        Hypercholesterolemia - Primary    Low cholesterol diet and exercise.  On lipitor.  Follow lipid panel and liver function tests.        Relevant Orders   Lipid panel (Completed)   Hepatic function panel (Completed)   Hyperglycemia    Low carb diet and exercise.  Follow met b and a1c.       Relevant Orders   Hemoglobin A1c (Completed)   Hypertension, essential    Blood pressure under good control.  Continue same medication regimen.  Follow pressures.  Follow metabolic panel.        Relevant Orders   TSH (Completed)   Basic metabolic panel (Completed)   Thyroid nodule    Worked up previously by Dr Eddie Dibbles.  Previous biopsy negative.  Last check  stable.  Recommended f/u prn.           Einar Pheasant, MD

## 2019-05-17 ENCOUNTER — Encounter: Payer: Self-pay | Admitting: Internal Medicine

## 2019-05-20 ENCOUNTER — Encounter: Payer: Self-pay | Admitting: Internal Medicine

## 2019-05-20 DIAGNOSIS — R198 Other specified symptoms and signs involving the digestive system and abdomen: Secondary | ICD-10-CM | POA: Insufficient documentation

## 2019-05-20 NOTE — Assessment & Plan Note (Signed)
Blood pressure under good control.  Continue same medication regimen.  Follow pressures.  Follow metabolic panel.   

## 2019-05-20 NOTE — Assessment & Plan Note (Signed)
Bowel change as outlined.  Add fiber.  Follow closely.  Call with update.

## 2019-05-20 NOTE — Assessment & Plan Note (Signed)
Worked up previously by Dr Eddie Dibbles.  Previous biopsy negative.  Last check stable.  Recommended f/u prn.

## 2019-05-20 NOTE — Assessment & Plan Note (Signed)
On lipitor

## 2019-05-20 NOTE — Assessment & Plan Note (Signed)
Low carb diet and exercise.  Follow met b and a1c.  

## 2019-05-20 NOTE — Assessment & Plan Note (Signed)
On finasteride.  Followed by urology.  Stable.

## 2019-05-20 NOTE — Assessment & Plan Note (Signed)
Low cholesterol diet and exercise.  On lipitor.  Follow lipid panel and liver function tests.   

## 2019-05-20 NOTE — Assessment & Plan Note (Signed)
Evaluated by Dr Lucky Cowboy 09/2018.  Stable.  Recommended f/u every other year.

## 2019-05-20 NOTE — Assessment & Plan Note (Signed)
No chest pain with increased activity or exertion.  Continue risk factor modification.

## 2019-06-21 DIAGNOSIS — Z85828 Personal history of other malignant neoplasm of skin: Secondary | ICD-10-CM | POA: Diagnosis not present

## 2019-06-21 DIAGNOSIS — L570 Actinic keratosis: Secondary | ICD-10-CM | POA: Diagnosis not present

## 2019-06-21 DIAGNOSIS — L738 Other specified follicular disorders: Secondary | ICD-10-CM | POA: Diagnosis not present

## 2019-06-21 DIAGNOSIS — X32XXXA Exposure to sunlight, initial encounter: Secondary | ICD-10-CM | POA: Diagnosis not present

## 2019-08-14 ENCOUNTER — Other Ambulatory Visit: Payer: Self-pay | Admitting: Internal Medicine

## 2019-11-13 ENCOUNTER — Encounter: Payer: Self-pay | Admitting: Internal Medicine

## 2019-11-13 ENCOUNTER — Other Ambulatory Visit: Payer: Self-pay

## 2019-11-13 ENCOUNTER — Ambulatory Visit: Payer: Medicare Other | Admitting: Internal Medicine

## 2019-11-13 VITALS — BP 118/62 | HR 56 | Temp 98.0°F | Resp 16 | Ht 71.0 in | Wt 178.6 lb

## 2019-11-13 DIAGNOSIS — E7800 Pure hypercholesterolemia, unspecified: Secondary | ICD-10-CM | POA: Diagnosis not present

## 2019-11-13 DIAGNOSIS — R972 Elevated prostate specific antigen [PSA]: Secondary | ICD-10-CM

## 2019-11-13 DIAGNOSIS — I10 Essential (primary) hypertension: Secondary | ICD-10-CM | POA: Diagnosis not present

## 2019-11-13 DIAGNOSIS — I723 Aneurysm of iliac artery: Secondary | ICD-10-CM

## 2019-11-13 DIAGNOSIS — R739 Hyperglycemia, unspecified: Secondary | ICD-10-CM

## 2019-11-13 DIAGNOSIS — I2510 Atherosclerotic heart disease of native coronary artery without angina pectoris: Secondary | ICD-10-CM

## 2019-11-13 DIAGNOSIS — E041 Nontoxic single thyroid nodule: Secondary | ICD-10-CM

## 2019-11-13 DIAGNOSIS — I700 Atherosclerosis of aorta: Secondary | ICD-10-CM

## 2019-11-13 LAB — HEPATIC FUNCTION PANEL
ALT: 14 U/L (ref 0–53)
AST: 19 U/L (ref 0–37)
Albumin: 4.2 g/dL (ref 3.5–5.2)
Alkaline Phosphatase: 51 U/L (ref 39–117)
Bilirubin, Direct: 0.1 mg/dL (ref 0.0–0.3)
Total Bilirubin: 0.6 mg/dL (ref 0.2–1.2)
Total Protein: 6.7 g/dL (ref 6.0–8.3)

## 2019-11-13 LAB — LIPID PANEL
Cholesterol: 160 mg/dL (ref 0–200)
HDL: 48.2 mg/dL (ref >39.00)
LDL Cholesterol: 96 mg/dL (ref 0–99)
NonHDL: 111.85
Total CHOL/HDL Ratio: 3
Triglycerides: 79 mg/dL (ref 0.0–149.0)
VLDL: 15.8 mg/dL (ref 0.0–40.0)

## 2019-11-13 LAB — CBC WITH DIFFERENTIAL/PLATELET
Basophils Absolute: 0 10*3/uL (ref 0.0–0.1)
Basophils Relative: 0.6 % (ref 0.0–3.0)
Eosinophils Absolute: 0.2 10*3/uL (ref 0.0–0.7)
Eosinophils Relative: 3.2 % (ref 0.0–5.0)
HCT: 39.8 % (ref 39.0–52.0)
Hemoglobin: 13.5 g/dL (ref 13.0–17.0)
Lymphocytes Relative: 30.5 % (ref 12.0–46.0)
Lymphs Abs: 1.7 10*3/uL (ref 0.7–4.0)
MCHC: 33.8 g/dL (ref 30.0–36.0)
MCV: 90.9 fl (ref 78.0–100.0)
Monocytes Absolute: 0.6 10*3/uL (ref 0.1–1.0)
Monocytes Relative: 10.3 % (ref 3.0–12.0)
Neutro Abs: 3.2 10*3/uL (ref 1.4–7.7)
Neutrophils Relative %: 55.4 % (ref 43.0–77.0)
Platelets: 150 10*3/uL (ref 150.0–400.0)
RBC: 4.38 Mil/uL (ref 4.22–5.81)
RDW: 14 % (ref 11.5–15.5)
WBC: 5.7 10*3/uL (ref 4.0–10.5)

## 2019-11-13 LAB — BASIC METABOLIC PANEL
BUN: 15 mg/dL (ref 6–23)
CO2: 30 mEq/L (ref 19–32)
Calcium: 9.5 mg/dL (ref 8.4–10.5)
Chloride: 104 mEq/L (ref 96–112)
Creatinine, Ser: 1.05 mg/dL (ref 0.40–1.50)
GFR: 68.82 mL/min (ref >60.00)
Glucose, Bld: 91 mg/dL (ref 70–99)
Potassium: 5 mEq/L (ref 3.5–5.1)
Sodium: 138 mEq/L (ref 135–145)

## 2019-11-13 LAB — HEMOGLOBIN A1C: Hgb A1c MFr Bld: 5.7 % (ref 4.6–6.5)

## 2019-11-13 LAB — T4, FREE: Free T4: 0.91 ng/dL (ref 0.60–1.60)

## 2019-11-13 LAB — TSH: TSH: 1.64 u[IU]/mL (ref 0.35–4.50)

## 2019-11-13 NOTE — Progress Notes (Signed)
Patient ID: Nathan Watts, male   DOB: [birth year], 74 y.o.   MRN: [id number]   Subjective:    Patient ID: Nathan Watts, male    DOB: 1945-09-04, 74 y.o.   MRN: [id number]  HPI This visit occurred during the SARS-CoV-2 public health emergency.  Safety protocols were in place, including screening questions prior to the visit, additional usage of staff PPE, and extensive cleaning of exam room while observing appropriate contact time as indicated for disinfecting solutions.  Patient here for a scheduled follow up.  He reports he is doing well.  Staying active.  No chest pain or sob reported.  No abdominal pain.  Bowels moving.  Reports no significant change.   Had covid vaccine.  They have bought a camper recently.  Has been camping several times in the last year.  Overall feels good.   Past Medical History:  Diagnosis Date  . Anemia, unspecified   . GERD (gastroesophageal reflux disease)   . Hypercholesterolemia   . Hyperglycemia   . Hypertension   . Thrombocytopenia ([place])   . Thyroid nodule    Past Surgical History:  Procedure Laterality Date  . COLONOSCOPY WITH PROPOFOL N/A 10/02/2014   Procedure: COLONOSCOPY WITH PROPOFOL;  Surgeon: Manya Silvas, MD;  Location: [facility] ENDOSCOPY;  Service: Endoscopy;  Laterality: N/A;  . INGUINAL HERNIA REPAIR    . TONSILLECTOMY     Family History  Problem Relation Age of Onset  . Prostate cancer Father   . Heart disease Father        s/p CABG  . Stroke Mother   . Colon cancer Neg Hx    Social History   Socioeconomic History  . Marital status: Married    Spouse name: Not on file  . Number of children: 4  . Years of education: Not on file  . Highest education level: Not on file  Occupational History  . Occupation: retired  Tobacco Use  . Smoking status: Former Smoker    Packs/day: 1.00    Years: 30.00    Pack years: 30.00    Types: Cigarettes    Quit date: 04/12/1998    Years since quitting: 21.6  . Smokeless tobacco: Former Systems developer    Quit  date: 04/12/1999  Vaping Use  . Vaping Use: Never used  Substance and Sexual Activity  . Alcohol use: Yes    Alcohol/week: 0.0 standard drinks    Comment: OCC  . Drug use: Not on file  . Sexual activity: Never  Other Topics Concern  . Not on file  Social History Narrative   He is married. Has four children   Social Determinants of Health   Financial Resource Strain: Low Risk   . Difficulty of Paying Living Expenses: Not hard at all  Food Insecurity: No Food Insecurity  . Worried About Charity fundraiser in the Last Year: Never true  . Ran Out of Food in the Last Year: Never true  Transportation Needs: No Transportation Needs  . Lack of Transportation (Medical): No  . Lack of Transportation (Non-Medical): No  Physical Activity: Sufficiently Active  . Days of Exercise per Week: 7 days  . Minutes of Exercise per Session: 120 min  Stress: No Stress Concern Present  . Feeling of Stress : Not at all  Social Connections:   . Frequency of Communication with Friends and Family:   . Frequency of Social Gatherings with Friends and Family:   . Attends Religious Services:   . Active Member of  Clubs or Organizations:   . Attends Archivist Meetings:   Marland Kitchen Marital Status:     Outpatient Encounter Medications as of 11/13/2019  Medication Sig  . aspirin 81 MG tablet Take 81 mg by mouth daily.  . finasteride (PROSCAR) 5 MG tablet Take 1 tablet (5 mg total) by mouth daily.  . Lactobacillus (PROBIOTIC ACIDOPHILUS PO) Take by mouth daily.  . metroNIDAZOLE (METROGEL) 0.75 % gel Apply topically 2 (two) times daily. to face  . Multiple Vitamin (MULTI VITAMIN MENS PO) Take 1 tablet by mouth daily.  . Omega-3 Fatty Acids (FISH OIL) 1200 MG CAPS Take 1 capsule by mouth daily.  . Omeprazole Magnesium (PRILOSEC OTC PO) Take by mouth daily.  . pimecrolimus (ELIDEL) 1 % cream APPLY TWICE DAILY AS NEEDED FOR FLARES  . vitamin B-12 (CYANOCOBALAMIN) 500 MCG tablet Take 500 mcg by mouth daily.    . [DISCONTINUED] atorvastatin (LIPITOR) 10 MG tablet TAKE 1 TABLET BY MOUTH DAILY   No facility-administered encounter medications on file as of 11/13/2019.    Review of Systems  Constitutional: Negative for appetite change and unexpected weight change.  HENT: Negative for congestion and sinus pressure.   Respiratory: Negative for cough, chest tightness and shortness of breath.   Cardiovascular: Negative for chest pain, palpitations and leg swelling.  Gastrointestinal: Negative for abdominal pain, diarrhea, nausea and vomiting.  Genitourinary: Negative for difficulty urinating and dysuria.  Musculoskeletal: Negative for joint swelling and myalgias.  Skin: Negative for color change and rash.  Neurological: Negative for dizziness, light-headedness and headaches.  Psychiatric/Behavioral: Negative for agitation and dysphoric mood.       Objective:    Physical Exam Vitals reviewed.  Constitutional:      General: He is not in acute distress.    Appearance: Normal appearance. He is well-developed.  HENT:     Head: Normocephalic and atraumatic.     Right Ear: External ear normal.     Left Ear: External ear normal.  Eyes:     General: No scleral icterus.       Right eye: No discharge.     Conjunctiva/sclera: Conjunctivae normal.  Neck:     Comments: Enlarged thyroid - right lobe.  Cardiovascular:     Rate and Rhythm: Normal rate and regular rhythm.  Pulmonary:     Effort: Pulmonary effort is normal. No respiratory distress.     Breath sounds: Normal breath sounds.  Abdominal:     General: Bowel sounds are normal.     Palpations: Abdomen is soft.     Tenderness: There is no abdominal tenderness.  Musculoskeletal:        General: No swelling or tenderness.  Skin:    Findings: No erythema or rash.  Neurological:     Mental Status: He is alert.  Psychiatric:        Mood and Affect: Mood normal.        Behavior: Behavior normal.     BP 118/62   Pulse (!) 56   Temp 98 F  (36.7 C)   Resp 16   Ht _0  (1.803 m)   Wt 178 lb 9.6 oz (81 kg)   SpO2 98%   BMI 24.91 kg/m  Wt Readings from Last 3 Encounters:  11/13/19 178 lb 9.6 oz (81 kg)  05/15/19 179 lb 3.2 oz (81.3 kg)  03/30/19 175 lb (79.4 kg)     Lab Results  Component Value Date   WBC 5.7 11/13/2019   HGB 13.5  11/13/2019   HCT 39.8 11/13/2019   PLT 150.0 11/13/2019   GLUCOSE 91 11/13/2019   CHOL 160 11/13/2019   TRIG 79.0 11/13/2019   HDL 48.20 11/13/2019   LDLCALC 96 11/13/2019   ALT 14 11/13/2019   AST 19 11/13/2019   NA 138 11/13/2019   K 5.0 11/13/2019   CL 104 11/13/2019   CREATININE 1.05 11/13/2019   BUN 15 11/13/2019   CO2 30 11/13/2019   TSH 1.64 11/13/2019   PSA 2.60 11/22/2018   HGBA1C 5.7 11/13/2019       Assessment & Plan:   Problem List Items Addressed This Visit    Thyroid nodule - Primary    Worked up previously by Dr Eddie Dibbles.  Previous biopsy negative.  Last check stable.  Recommended f/u prn. Check tsh and free T4 today.       Relevant Orders   TSH (Completed)   T4, free (Completed)   Hypertension, essential    Blood pressure doing well.  On no medication.  Follow pressures.  Follow metabolic panel.       Relevant Orders   CBC with Differential/Platelet (Completed)   Basic metabolic panel (Completed)   Hyperglycemia    Low carb diet and exercise.  Follow met b and a1c.       Relevant Orders   Hemoglobin A1c (Completed)   Hypercholesterolemia    On lipitor.  Low cholesterol diet and exercise.  Follow lipid panel and liver function tests.       Relevant Orders   Hepatic function panel (Completed)   Lipid panel (Completed)   Elevated PSA    Seeing Dr Bernardo Heater.  PSA 03/2019 - 1.9.  Follow.       Coronary artery disease    Continue risk factor modification.  Stable.       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  Stable.  Recommended f/u every other year.        Aortic atherosclerosis ([facility])    On lipitor.             Einar Pheasant, MD

## 2019-11-14 ENCOUNTER — Other Ambulatory Visit: Payer: Self-pay | Admitting: Internal Medicine

## 2019-11-15 ENCOUNTER — Other Ambulatory Visit: Payer: Self-pay | Admitting: Internal Medicine

## 2019-11-15 DIAGNOSIS — E875 Hyperkalemia: Secondary | ICD-10-CM

## 2019-11-15 NOTE — Progress Notes (Signed)
Order placed for f/u potassium check.  

## 2019-11-26 ENCOUNTER — Encounter: Payer: Self-pay | Admitting: Internal Medicine

## 2019-11-26 NOTE — Assessment & Plan Note (Signed)
Seeing Dr Bernardo Heater.  PSA 03/2019 - 1.9.  Follow.

## 2019-11-26 NOTE — Assessment & Plan Note (Signed)
Last evaluated by [facility] 09/2018.  Stable.  Recommended f/u every other year.

## 2019-11-26 NOTE — Assessment & Plan Note (Signed)
Blood pressure doing well.  On no medication.  Follow pressures.  Follow metabolic panel.

## 2019-11-26 NOTE — Assessment & Plan Note (Signed)
Worked up previously by Dr Eddie Dibbles.  Previous biopsy negative.  Last check stable.  Recommended f/u prn. Check tsh and free T4 today.

## 2019-11-26 NOTE — Assessment & Plan Note (Signed)
Continue risk factor modification.  Stable.

## 2019-11-26 NOTE — Assessment & Plan Note (Signed)
Low carb diet and exercise.  Follow met b and a1c.  

## 2019-11-26 NOTE — Assessment & Plan Note (Signed)
On lipitor

## 2019-11-26 NOTE — Assessment & Plan Note (Signed)
On lipitor.  Low cholesterol diet and exercise.  Follow lipid panel and liver function tests.   

## 2019-11-27 ENCOUNTER — Other Ambulatory Visit: Payer: Self-pay

## 2019-11-27 ENCOUNTER — Other Ambulatory Visit (INDEPENDENT_AMBULATORY_CARE_PROVIDER_SITE_OTHER): Payer: Medicare Other

## 2019-11-27 DIAGNOSIS — E875 Hyperkalemia: Secondary | ICD-10-CM | POA: Diagnosis not present

## 2019-11-27 LAB — POTASSIUM: Potassium: 4.4 mEq/L (ref 3.5–5.1)

## 2019-12-07 ENCOUNTER — Encounter: Payer: Self-pay | Admitting: Internal Medicine

## 2019-12-11 ENCOUNTER — Encounter: Payer: Self-pay | Admitting: Internal Medicine

## 2019-12-11 DIAGNOSIS — M4316 Spondylolisthesis, lumbar region: Secondary | ICD-10-CM | POA: Diagnosis not present

## 2019-12-11 DIAGNOSIS — I700 Atherosclerosis of aorta: Secondary | ICD-10-CM | POA: Diagnosis not present

## 2019-12-11 DIAGNOSIS — R300 Dysuria: Secondary | ICD-10-CM | POA: Diagnosis not present

## 2019-12-11 DIAGNOSIS — M5136 Other intervertebral disc degeneration, lumbar region: Secondary | ICD-10-CM | POA: Diagnosis not present

## 2019-12-11 DIAGNOSIS — M545 Low back pain: Secondary | ICD-10-CM | POA: Diagnosis not present

## 2019-12-12 NOTE — Telephone Encounter (Signed)
Sent pt a my chart message for update.  Reviewed [facility] records.  Need xray result from acute care.  Cannot see in care everywhere.. please call and have them fax over report.

## 2019-12-13 NOTE — Telephone Encounter (Signed)
Need to xray result.  Also, please call pt and see if he is taking the medication that was prescribed in acute care. Sounds like he may need to be reevaluated if having persistent pain.

## 2019-12-14 ENCOUNTER — Other Ambulatory Visit: Payer: Self-pay

## 2019-12-14 ENCOUNTER — Encounter: Payer: Self-pay | Admitting: Emergency Medicine

## 2019-12-14 ENCOUNTER — Ambulatory Visit
Admission: EM | Admit: 2019-12-14 | Discharge: 2019-12-14 | Disposition: A | Discharge status: Home / Self Care | Payer: Medicare Other | Attending: Physician Assistant | Admitting: Physician Assistant

## 2019-12-14 ENCOUNTER — Ambulatory Visit (INDEPENDENT_AMBULATORY_CARE_PROVIDER_SITE_OTHER)
Admit: 2019-12-14 | Discharge: 2019-12-14 | Disposition: A | Discharge status: Home / Self Care | Payer: Medicare Other | Attending: Internal Medicine | Admitting: Internal Medicine

## 2019-12-14 DIAGNOSIS — N50812 Left testicular pain: Secondary | ICD-10-CM | POA: Insufficient documentation

## 2019-12-14 DIAGNOSIS — N433 Hydrocele, unspecified: Secondary | ICD-10-CM | POA: Diagnosis not present

## 2019-12-14 DIAGNOSIS — R1010 Upper abdominal pain, unspecified: Secondary | ICD-10-CM

## 2019-12-14 DIAGNOSIS — N453 Epididymo-orchitis: Secondary | ICD-10-CM | POA: Diagnosis not present

## 2019-12-14 DIAGNOSIS — K2900 Acute gastritis without bleeding: Secondary | ICD-10-CM | POA: Diagnosis not present

## 2019-12-14 DIAGNOSIS — I861 Scrotal varices: Secondary | ICD-10-CM | POA: Diagnosis not present

## 2019-12-14 DIAGNOSIS — M545 Low back pain, unspecified: Secondary | ICD-10-CM

## 2019-12-14 DIAGNOSIS — G8929 Other chronic pain: Secondary | ICD-10-CM | POA: Insufficient documentation

## 2019-12-14 DIAGNOSIS — R1032 Left lower quadrant pain: Secondary | ICD-10-CM | POA: Diagnosis not present

## 2019-12-14 LAB — CBC WITH DIFFERENTIAL/PLATELET
Abs Immature Granulocytes: 0.02 K/uL (ref 0.00–0.07)
Basophils Absolute: 0.1 K/uL (ref 0.0–0.1)
Basophils Relative: 1 %
Eosinophils Absolute: 0.2 K/uL (ref 0.0–0.5)
Eosinophils Relative: 2 %
HCT: 42.2 % (ref 39.0–52.0)
Hemoglobin: 14 g/dL (ref 13.0–17.0)
Immature Granulocytes: 0 %
Lymphocytes Relative: 26 %
Lymphs Abs: 2.3 K/uL (ref 0.7–4.0)
MCH: 30.2 pg (ref 26.0–34.0)
MCHC: 33.2 g/dL (ref 30.0–36.0)
MCV: 90.9 fL (ref 80.0–100.0)
Monocytes Absolute: 0.7 K/uL (ref 0.1–1.0)
Monocytes Relative: 8 %
Neutro Abs: 5.5 K/uL (ref 1.7–7.7)
Neutrophils Relative %: 63 %
Platelets: 184 K/uL (ref 150–400)
RBC: 4.64 MIL/uL (ref 4.22–5.81)
RDW: 13.9 % (ref 11.5–15.5)
WBC: 8.8 K/uL (ref 4.0–10.5)
nRBC: 0 % (ref 0.0–0.2)

## 2019-12-14 LAB — LIPASE, BLOOD: Lipase: 35 U/L (ref 11–51)

## 2019-12-14 LAB — URINALYSIS, COMPLETE (UACMP) WITH MICROSCOPIC
Bilirubin Urine: NEGATIVE
Glucose, UA: NEGATIVE mg/dL
Hgb urine dipstick: NEGATIVE
Ketones, ur: NEGATIVE mg/dL
Nitrite: NEGATIVE
Protein, ur: NEGATIVE mg/dL
Specific Gravity, Urine: 1.01 (ref 1.005–1.030)
pH: 5.5 (ref 5.0–8.0)

## 2019-12-14 LAB — COMPREHENSIVE METABOLIC PANEL WITH GFR
ALT: 22 U/L (ref 0–44)
AST: 18 U/L (ref 15–41)
Albumin: 4.6 g/dL (ref 3.5–5.0)
Alkaline Phosphatase: 43 U/L (ref 38–126)
Anion gap: 6 (ref 5–15)
BUN: 14 mg/dL (ref 8–23)
CO2: 29 mmol/L (ref 22–32)
Calcium: 8.9 mg/dL (ref 8.9–10.3)
Chloride: 104 mmol/L (ref 98–111)
Creatinine, Ser: 1.07 mg/dL (ref 0.61–1.24)
GFR calc Af Amer: >60 mL/min
GFR calc non Af Amer: >60 mL/min
Glucose, Bld: 93 mg/dL (ref 70–99)
Potassium: 4.7 mmol/L (ref 3.5–5.1)
Sodium: 139 mmol/L (ref 135–145)
Total Bilirubin: 0.5 mg/dL (ref 0.3–1.2)
Total Protein: 7.7 g/dL (ref 6.5–8.1)

## 2019-12-14 MED ORDER — LEVOFLOXACIN 500 MG PO TABS: 500.0000 mg | ORAL_TABLET | Freq: Every day | ORAL | 0 refills | Status: AC

## 2019-12-14 NOTE — Telephone Encounter (Signed)
Pt is taking medication without much relief. Sharp pains have resolved. Patient is now having a constant aching feeling in his right hip and across his back that radiates around to the front on the left and right side of his abd. Also noted that his testicles are tender to the touch. No other acute symptoms noted. Patient is agreeable to go back to the UC to be re-evaluated but just wanted your opinion first.

## 2019-12-14 NOTE — Telephone Encounter (Signed)
Patient is going to [facility] to be evaluated.

## 2019-12-14 NOTE — ED Triage Notes (Signed)
Patient c/o right side pain that started a week ago.  Patient states that he was seen at [facility] and was started on a muscle relaxer and Naproxen. Patient states that over the past 4 days the pain has spread to his lower back, groin, abdomen and right hip.  Patient denies fevers. Patient denies N/V/D.

## 2019-12-14 NOTE — ED Provider Notes (Signed)
[facility]    CSN: [id number] Arrival date & time: 12/14/19  1428      History   Chief Complaint Chief Complaint  Patient presents with  . Abdominal Pain  . Back Pain    HPI Nathan Watts is a 74 y.o. male.   74 year old male presents with multiple complaints.  He says he has been having acute worsening of chronic back pain for the past week.  He was seen at [facility] clinic last week and had a urinalysis and lumbar x-rays performed.  He was told all of these tests were normal.  Patient says complaining of sharp right flank pain and right side pain when he went to the clinic.  He says the sharp pains have resolved in that area.  He says he still feels a dull aching pain in that area.  He also feels aching pain across the top of the left, mid, and right abdomen.  Patient also admits to left-sided groin pain that is mild as well as pain into the left testicle over the past couple of days.  He says that testicle is tender to touch.  Patient has a history of BPH, left inguinal hernia repair 10 years ago, prostatitis, and common iliac abdominal aneurysm.  He says he was given naproxen and Flexeril at his visit to the [facility].  He says his medications did not help.  He says he was advised by his PCP a couple of days ago to seek further work-up through urgent care.  Denies fever, fatigue, chills, sweats, nausea, vomiting, diarrhea, dysuria, hematuria, changes in appetite, urethral discharge, or any severe pain.  Says his pain might be a little bit better right now than it has been.  He has no other concerns today.     Past Medical History:  Diagnosis Date  . Anemia, unspecified   . GERD (gastroesophageal reflux disease)   . Hypercholesterolemia   . Hyperglycemia   . Hypertension   . Thrombocytopenia ([place])   . Thyroid nodule     Patient Active Problem List   Diagnosis Date Noted  . Change in bowel movement 05/20/2019  . BPH with obstruction/lower urinary tract  symptoms 03/27/2018  . Cough 11/20/2017  . Rib pain 10/31/2017  . 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 Elevated PSA 07/14/2015  . Aortic atherosclerosis ([place]) 03/13/2015  . Coronary artery disease 03/13/2015  . History of tobacco abuse 03/13/2015  . Hypertension, essential 03/05/2015  . Health care maintenance 10/09/2014  . Plantar fasciitis 12/31/2012  . Thyroid nodule 03/14/2012  . Hyperglycemia 03/14/2012  . Hypercholesterolemia 03/14/2012    Past Surgical History:  Procedure Laterality Date  . COLONOSCOPY WITH PROPOFOL N/A 10/02/2014   Procedure: COLONOSCOPY WITH PROPOFOL;  Surgeon: Manya Silvas, MD;  Location: [facility] ENDOSCOPY;  Service: Endoscopy;  Laterality: N/A;  . INGUINAL HERNIA REPAIR    . TONSILLECTOMY         Home Medications    Prior to Admission medications   Medication Sig Start Date End Date Taking? Authorizing Provider  aspirin 81 MG tablet Take 81 mg by mouth daily.   Yes [provider]  atorvastatin (LIPITOR) 10 MG tablet TAKE 1 TABLET BY MOUTH DAILY 11/15/19  Yes Einar Pheasant, MD  finasteride (PROSCAR) 5 MG tablet Take 1 tablet (5 mg total) by mouth daily. 03/30/19  Yes Stoioff, Ronda Fairly, MD  Lactobacillus (PROBIOTIC ACIDOPHILUS PO) Take by mouth daily.   Yes [provider]  Multiple Vitamin (MULTI VITAMIN MENS  PO) Take 1 tablet by mouth daily.   Yes [provider]  Omega-3 Fatty Acids (FISH OIL) 1200 MG CAPS Take 1 capsule by mouth daily.   Yes [provider]  Omeprazole Magnesium (PRILOSEC OTC PO) Take by mouth daily.   Yes [provider]  vitamin B-12 (CYANOCOBALAMIN) 500 MCG tablet Take 500 mcg by mouth daily.   Yes [provider]  levofloxacin (LEVAQUIN) 500 MG tablet Take 1 tablet (500 mg total) by mouth daily for 10 days. 12/14/19 12/24/19  Laurene Footman B, PA-C  metroNIDAZOLE (METROGEL) 0.75 % gel Apply topically 2 (two) times daily. to face 04/27/19   [provider]    pimecrolimus (ELIDEL) 1 % cream APPLY TWICE DAILY AS NEEDED FOR FLARES 04/26/19   [provider]    Family History Family History  Problem Relation Age of Onset  . Prostate cancer Father   . Heart disease Father        s/p CABG  . Stroke Mother   . Colon cancer Neg Hx     Social History Social History   Tobacco Use  . Smoking status: Former Smoker    Packs/day: 1.00    Years: 30.00    Pack years: 30.00    Types: Cigarettes    Quit date: 04/12/1998    Years since quitting: 21.6  . Smokeless tobacco: Former Systems developer    Quit date: 04/12/1999  Vaping Use  . Vaping Use: Never used  Substance Use Topics  . Alcohol use: Yes    Alcohol/week: 0.0 standard drinks    Comment: OCC  . Drug use: Not on file     Allergies   Hydrocodone-chlorpheniramine and Crestor [rosuvastatin]   Review of Systems Review of Systems  Constitutional: Negative for appetite change, chills, diaphoresis, fatigue and fever.  Respiratory: Negative for cough and shortness of breath.   Cardiovascular: Negative for chest pain.  Gastrointestinal: Positive for abdominal pain. Negative for abdominal distention, blood in stool, constipation, diarrhea, nausea, rectal pain and vomiting.  Genitourinary: Positive for flank pain, frequency (chronic due to BPH) and testicular pain. Negative for decreased urine volume, difficulty urinating, discharge, dysuria, hematuria, penile pain, scrotal swelling and urgency.  Musculoskeletal: Positive for back pain. Negative for joint swelling.  Skin: Negative for color change and rash.  Neurological: Negative for weakness and light-headedness.     Physical Exam Triage Vital Signs ED Triage Vitals  Enc Vitals Group     BP 12/14/19 1445 135/78     Pulse Rate 12/14/19 1445 (!) 50     Resp 12/14/19 1445 16     Temp 12/14/19 1445 98.1 F (36.7 C)     Temp Source 12/14/19 1445 Oral     SpO2 12/14/19 1445 97 %     Weight 12/14/19 1444 178 lb (80.7 kg)     Height  12/14/19 1444 5\' 11"  (1.803 m)     Head Circumference --      Peak Flow --      Pain Score 12/14/19 1443 8     Pain Loc --      Pain Edu? --      Excl. in [place]? --    No data found.  Updated Vital Signs BP 135/78 (BP Location: Right Arm)   Pulse (!) 50   Temp 98.1 F (36.7 C) (Oral)   Resp 16   Ht 5\' 11"  (1.803 m)   Wt 178 lb (80.7 kg)   SpO2 97%   BMI 24.83 kg/m  Physical Exam Vitals and nursing note reviewed. Exam conducted with a chaperone present.  Constitutional:      General: He is not in acute distress.    Appearance: Normal appearance. He is well-developed. He is not ill-appearing or toxic-appearing.  HENT:     Head: Normocephalic and atraumatic.  Eyes:     General: No scleral icterus.    Extraocular Movements: Extraocular movements intact.     Conjunctiva/sclera: Conjunctivae normal.     Pupils: Pupils are equal, round, and reactive to light.  Cardiovascular:     Rate and Rhythm: Regular rhythm. Bradycardia present.     Heart sounds: Normal heart sounds. No murmur heard.   Pulmonary:     Effort: Pulmonary effort is normal. No respiratory distress.     Breath sounds: Normal breath sounds.  Abdominal:     General: Bowel sounds are normal.     Palpations: Abdomen is soft. There is no mass.     Tenderness: There is abdominal tenderness in the right upper quadrant, epigastric area, suprapubic area and left upper quadrant. There is no right CVA tenderness, left CVA tenderness, guarding or rebound. Negative signs include Murphy's sign and McBurney's sign.     Hernia: A hernia is present. Hernia is present in the left inguinal area (questionable; there is increased swelling and bulging).  Genitourinary:    Penis: Normal.      Testes:        Left: Tenderness and swelling present.  Musculoskeletal:        General: No swelling, tenderness, deformity or signs of injury. Normal range of motion.     Cervical back: Normal range of motion and neck supple.  Skin:     General: Skin is warm and dry.     Findings: No rash.  Neurological:     General: No focal deficit present.     Mental Status: He is alert. Mental status is at baseline.     Motor: No weakness.     Coordination: Coordination normal.     Gait: Gait normal.  Psychiatric:        Mood and Affect: Mood normal.        Behavior: Behavior normal.        Thought Content: Thought content normal.      UC Treatments / Results  Labs (all labs ordered are listed, but only abnormal results are displayed) Labs Reviewed  URINALYSIS, COMPLETE (UACMP) WITH MICROSCOPIC - Abnormal; Notable for the following components:      Result Value   Leukocytes,Ua TRACE (*)    Bacteria, UA FEW (*)    All other components within normal limits  URINE CULTURE  CBC WITH DIFFERENTIAL/PLATELET  COMPREHENSIVE METABOLIC PANEL  LIPASE, BLOOD    EKG   Radiology US SCROTUM W/DOPPLER  Result Date: 12/14/2019 CLINICAL DATA:  Left groin/testicular pain for several days EXAM: SCROTAL ULTRASOUND DOPPLER ULTRASOUND OF THE TESTICLES TECHNIQUE: Complete ultrasound examination of the testicles, epididymis, and other scrotal structures was performed. Color and spectral Doppler ultrasound were also utilized to evaluate blood flow to the testicles. COMPARISON:  Scrotal ultrasound 06/04/2015 FINDINGS: Right testicle Measurements: 4.4 x 2.4 x 3 point cm. Normal uniform testicular parenchyma. No concerning testicular mass or microlithiasis. Left testicle Measurements: 3.9 x 1.6 x 2.8 cm. Heterogeneity of the left testicular parenchyma is similar to the comparison ultrasound from 2017 though some mildly increased color Doppler flow on side-by-side imaging is present. Right epididymis:  Normal in size and appearance. Left epididymis: Normal  in size and slightly more heterogenous and hypoechoic than the contralateral right epididymis. Hydrocele: Mildly increased right paratesticular fluid may reflect trace right hydrocele. No left hydrocele.  Varicocele: Isolated varicocele with borderline dilatation of the draining veins with provocative Valsalva maneuver to approximately 3 mm. No right varicocele. Pulsed Doppler interrogation of both testes demonstrates normal low resistance arterial and venous waveforms bilaterally. Other: No visible protrusion of fat or bowel into the inguinal canals. IMPRESSION: Some heterogeneity of the left testicle and to a lesser extent the left epididymis. While similar appearance is present on comparison from 2017 could suggest a chronic process including prior infection, inflammation or trauma, some slightly asymmetrically increased color flow on side-by-side imaging could reflect an acute on more chronic epididymo-orchitis. Malignant etiology is significantly less favored given a lack of progression from comparison. Isolated left varicocele. Trace right hydrocele. No visible inguinal hernia. No evidence of testicular mass or torsion. Electronically Signed   By: Lovena Le M.D.   On: 12/14/2019 16:42    Procedures Procedures (including critical care time)  Medications Ordered in UC Medications - No data to display  Initial Impression / Assessment and Plan / UC Course  I have reviewed the triage vital signs and the nursing notes.  Pertinent labs & imaging results that were available during my care of the patient were reviewed by me and considered in my medical decision making (see chart for details).   Advised patient of the following:  1. Back pain: Imaging previously obtained at [facility] clinic is normal according to you.  Your back pain is chronic and could be flaring up right now. I am unsure if the back pain is related to the other symptoms.  To help your back pain you should perform stretches daily, try heat or ice, continue Tylenol and muscle relaxers as needed.  May also try Tylenol for pain relief.  If the back pain is a distant problem for you please follow-up with orthopedics or your PCP.  You may  need MRI or possibly just sent to physical therapy.  For any severe worsening of back pain, loss of bowel or bladder control, or leg weakness go to the emergency room. 2. Abdominal pain: A urinalysis was performed today which shows trace leukocytes.  The urine will be sent for culture and we will treat you for urinary infection if culture is positive.  You do not have any urinary symptoms at this point so we will wait for culture results before treating.  Also perform multiple labs.  A complete blood count was normal.  A complete metabolic panel was also normal.  The complete blood count assesses for signs of severe infection and anemia.  The complete metabolic panel checks for kidney function, liver function, signs of gallbladder infection or [place].  It also checks your electrolyte status.  A lipase lab was drawn which was normal.  That checks for pancreatic infection.  So other than a mildly abnormal urinalysis your labs were normal.  Upper abdominal pain is likely related to recent high-dose NSAID use AKA naproxen.  Discontinue NSAID use at this time and continue Prilosec.  Increase fluids.  Take Tylenol for pain relief. 3. Groin and testicular pain: You did have some tenderness in the groin and testicular region on the left side so an ultrasound was performed today.  He previously had a hernia that was repaired in this area.  Based on the ultrasound today there is an abnormality of the left testicle.  The radiologist believes  you could have acute on chronic infection of the epididymis.  We will begin you on antibiotics at this time. Please follow-up with your urologist if not feeling better after starting the antibiotics.  Final Clinical Impressions(s) / UC Diagnoses   Final diagnoses:  Testicular pain, left  Chronic bilateral low back pain without sciatica  Upper abdominal pain  Epididymo-orchitis, acute  Acute gastritis without hemorrhage, unspecified gastritis type     Discharge Instructions       4. Back pain: Imaging previously obtained at [facility] clinic is normal according to you.  Your back pain is chronic and could be flaring up right now. I am unsure if the back pain is related to the other symptoms.  To help your back pain you should perform stretches daily, try heat or ice, continue Tylenol and muscle relaxers as needed.  May also try Tylenol for pain relief.  If the back pain is a distant problem for you please follow-up with orthopedics or your PCP.  You may need MRI or possibly just sent to physical therapy.  For any severe worsening of back pain, loss of bowel or bladder control, or leg weakness go to the emergency room. 5. Abdominal pain: A urinalysis was performed today which shows trace leukocytes.  The urine will be sent for culture and we will treat you for urinary infection if culture is positive.  You do not have any urinary symptoms at this point so we will wait for culture results before treating.  Also perform multiple labs.  A complete blood count was normal.  A complete metabolic panel was also normal.  The complete blood count assesses for signs of severe infection and anemia.  The complete metabolic panel checks for kidney function, liver function, signs of gallbladder infection or [place].  It also checks your electrolyte status.  A lipase lab was drawn which was normal.  That checks for pancreatic infection.  So other than a mildly abnormal urinalysis your labs were normal.  Upper abdominal pain is likely related to recent high-dose NSAID use AKA naproxen.  Discontinue NSAID use at this time and continue Prilosec.  Increase fluids.  Take Tylenol for pain relief. 6. Groin and testicular pain: You did have some tenderness in the groin and testicular region on the left side so an ultrasound was performed today.  He previously had a hernia that was repaired in this area.  Based on the ultrasound today there is an abnormality of the left testicle.  The radiologist believes you  could have acute on chronic infection of the epididymis.  We will begin you on antibiotics at this time. Please follow-up with your urologist if not feeling better after starting the antibiotics.    ED Prescriptions    Medication Sig Dispense Auth. Provider   levofloxacin (LEVAQUIN) 500 MG tablet Take 1 tablet (500 mg total) by mouth daily for 10 days. 10 tablet Gretta Cool     PDMP not reviewed this encounter.   Laurene Footman B, PA-C 12/15/19 0800

## 2019-12-14 NOTE — Telephone Encounter (Signed)
Given increased pain - radiating around from back to lower abdomen - I do feel he needs to be reevaluated and may need to be scanned - to help determine etiology.

## 2019-12-14 NOTE — Discharge Instructions (Addendum)
Back pain: Imaging previously obtained at [facility] clinic is normal according to you.  Your back pain is chronic and could be flaring up right now. I am unsure if the back pain is related to the other symptoms.  To help your back pain you should perform stretches daily, try heat or ice, continue Tylenol and muscle relaxers as needed.  May also try Tylenol for pain relief.  If the back pain is a distant problem for you please follow-up with orthopedics or your PCP.  You may need MRI or possibly just sent to physical therapy.  For any severe worsening of back pain, loss of bowel or bladder control, or leg weakness go to the emergency room. Abdominal pain: A urinalysis was performed today which shows trace leukocytes.  The urine will be sent for culture and we will treat you for urinary infection if culture is positive.  You do not have any urinary symptoms at this point so we will wait for culture results before treating.  Also perform multiple labs.  A complete blood count was normal.  A complete metabolic panel was also normal.  The complete blood count assesses for signs of severe infection and anemia.  The complete metabolic panel checks for kidney function, liver function, signs of gallbladder infection or [place].  It also checks your electrolyte status.  A lipase lab was drawn which was normal.  That checks for pancreatic infection.  So other than a mildly abnormal urinalysis your labs were normal.  Upper abdominal pain is likely related to recent high-dose NSAID use AKA naproxen.  Discontinue NSAID use at this time and continue Prilosec.  Increase fluids.  Take Tylenol for pain relief. Groin and testicular pain: You did have some tenderness in the groin and testicular region on the left side so an ultrasound was performed today.  He previously had a hernia that was repaired in this area.  Based on the ultrasound today there is an abnormality of the left testicle.  The radiologist believes you could have acute  on chronic infection of the epididymis.  We will begin you on antibiotics at this time. Please follow-up with your urologist if not feeling better after starting the antibiotics.

## 2019-12-16 LAB — URINE CULTURE: Culture: <10000 — AB

## 2020-01-04 ENCOUNTER — Ambulatory Visit (INDEPENDENT_AMBULATORY_CARE_PROVIDER_SITE_OTHER): Payer: Medicare Other

## 2020-01-04 ENCOUNTER — Other Ambulatory Visit: Payer: Self-pay

## 2020-01-04 DIAGNOSIS — Z23 Encounter for immunization: Secondary | ICD-10-CM | POA: Diagnosis not present

## 2020-01-08 ENCOUNTER — Other Ambulatory Visit: Payer: Self-pay

## 2020-01-08 ENCOUNTER — Ambulatory Visit (INDEPENDENT_AMBULATORY_CARE_PROVIDER_SITE_OTHER): Payer: Medicare Other

## 2020-01-08 ENCOUNTER — Encounter: Payer: Self-pay | Admitting: Emergency Medicine

## 2020-01-08 ENCOUNTER — Ambulatory Visit
Admission: EM | Admit: 2020-01-08 | Discharge: 2020-01-08 | Disposition: A | Discharge status: Home / Self Care | Payer: Medicare Other | Attending: Physician Assistant | Admitting: Physician Assistant

## 2020-01-08 DIAGNOSIS — M79645 Pain in left finger(s): Secondary | ICD-10-CM | POA: Diagnosis not present

## 2020-01-08 DIAGNOSIS — L03012 Cellulitis of left finger: Secondary | ICD-10-CM | POA: Diagnosis not present

## 2020-01-08 DIAGNOSIS — M19042 Primary osteoarthritis, left hand: Secondary | ICD-10-CM | POA: Diagnosis not present

## 2020-01-08 DIAGNOSIS — M7989 Other specified soft tissue disorders: Secondary | ICD-10-CM

## 2020-01-08 HISTORY — DX: Benign prostatic hyperplasia without lower urinary tract symptoms: N40.0

## 2020-01-08 MED ORDER — CEPHALEXIN 500 MG PO CAPS: 500.0000 mg | ORAL_CAPSULE | Freq: Four times a day (QID) | ORAL | 0 refills | Status: AC

## 2020-01-08 MED ORDER — PREDNISONE 10 MG PO TABS: ORAL_TABLET | ORAL | 0 refills | Status: DC

## 2020-01-08 NOTE — ED Provider Notes (Signed)
[facility]    CSN: [id number] Arrival date & time: 01/08/20  0845      History   Chief Complaint Chief Complaint  Patient presents with  . Hand Pain    left middle finger    HPI Nathan Watts is a 74 y.o. male presenting for redness and swelling of the left little finger.  He says he did not injure it. Patient says that 2 days ago he noticed a tiny on the finger and then yesterday a lot more of the finger swelled up and turned red. He admits to some pain and tenderness when he tries to bend the finger or touches it. He denies any drainage from the bump that is still there. Patient denies any known insect sting or bite, but says that he was working outside helping his church and did see some spiders. He has tried soaking it in Epsom salt. He has not taken any thing else including antihistamines are pain relievers over-the-counter. Patient denies any known allergies to insects or bugs. He denies any fevers, fatigue, chills. No known history of MRSA. No numbness, weakness, tingling. Denies history of gout or RA. Denies significant OA of hands. No other concerns today.  HPI  Past Medical History:  Diagnosis Date  . Anemia, unspecified   . BPH (benign prostatic hyperplasia)   . GERD (gastroesophageal reflux disease)   . Hypercholesterolemia   . Hyperglycemia   . Hypertension   . Thrombocytopenia ([place])   . Thyroid nodule     Patient Active Problem List   Diagnosis Date Noted  . Change in bowel movement 05/20/2019  . BPH with obstruction/lower urinary tract symptoms 03/27/2018  . Cough 11/20/2017  . Rib pain 10/31/2017  . 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 Elevated PSA 07/14/2015  . Aortic atherosclerosis ([place]) 03/13/2015  . Coronary artery disease 03/13/2015  . History of tobacco abuse 03/13/2015  . Hypertension, essential 03/05/2015  . Health care maintenance 10/09/2014  . Plantar fasciitis 12/31/2012  . Thyroid nodule 03/14/2012  . Hyperglycemia  03/14/2012  . Hypercholesterolemia 03/14/2012    Past Surgical History:  Procedure Laterality Date  . COLONOSCOPY WITH PROPOFOL N/A 10/02/2014   Procedure: COLONOSCOPY WITH PROPOFOL;  Surgeon: Manya Silvas, MD;  Location: [facility] ENDOSCOPY;  Service: Endoscopy;  Laterality: N/A;  . INGUINAL HERNIA REPAIR    . TONSILLECTOMY         Home Medications    Prior to Admission medications   Medication Sig Start Date End Date Taking? Authorizing Provider  aspirin 81 MG tablet Take 81 mg by mouth daily.   Yes [provider]  atorvastatin (LIPITOR) 10 MG tablet TAKE 1 TABLET BY MOUTH DAILY 11/15/19  Yes Einar Pheasant, MD  finasteride (PROSCAR) 5 MG tablet Take 1 tablet (5 mg total) by mouth daily. 03/30/19  Yes Stoioff, Ronda Fairly, MD  Lactobacillus (PROBIOTIC ACIDOPHILUS PO) Take by mouth daily.   Yes [provider]  metroNIDAZOLE (METROGEL) 0.75 % gel Apply topically 2 (two) times daily. to face 04/27/19  Yes [provider]  Multiple Vitamin (MULTI VITAMIN MENS PO) Take 1 tablet by mouth daily.   Yes [provider]  Omega-3 Fatty Acids (FISH OIL) 1200 MG CAPS Take 1 capsule by mouth daily.   Yes [provider]  Omeprazole Magnesium (PRILOSEC OTC PO) Take by mouth daily.   Yes [provider]  pimecrolimus (ELIDEL) 1 % cream APPLY TWICE DAILY AS NEEDED FOR FLARES 04/26/19  Yes [provider]  vitamin B-12 (CYANOCOBALAMIN) 500 MCG tablet Take 500 mcg by mouth daily.   Yes [provider]  cephALEXin (KEFLEX) 500 MG capsule Take 1 capsule (500 mg total) by mouth 4 (four) times daily for 7 days. 01/08/20 01/15/20  Laurene Footman B, PA-C  predniSONE (DELTASONE) 10 MG tablet Take 6 tabs p.o. on day 1 and decrease by 1 tablet for the next 5 days. 01/08/20   Danton Clap, PA-C    Family History Family History  Problem Relation Age of Onset  . Prostate cancer Father   . Heart disease Father        s/p CABG  . Stroke Mother     . Colon cancer Neg Hx     Social History Social History   Tobacco Use  . Smoking status: Former Smoker    Packs/day: 1.00    Years: 30.00    Pack years: 30.00    Types: Cigarettes    Quit date: 04/12/1998    Years since quitting: 21.7  . Smokeless tobacco: Never Used  Vaping Use  . Vaping Use: Never used  Substance Use Topics  . Alcohol use: Yes    Alcohol/week: 0.0 standard drinks    Comment: OCC  . Drug use: Never     Allergies   Hydrocodone-chlorpheniramine and Crestor [rosuvastatin]   Review of Systems Review of Systems  Constitutional: Negative for fatigue and fever.  Musculoskeletal: Positive for arthralgias and joint swelling. Negative for myalgias.  Skin: Positive for color change. Negative for rash and wound.  Neurological: Negative for weakness and numbness.  Hematological: Negative for adenopathy. Does not bruise/bleed easily.     Physical Exam Triage Vital Signs ED Triage Vitals  Enc Vitals Group     BP 01/08/20 0902 133/80     Pulse Rate 01/08/20 0902 (!) 49     Resp 01/08/20 0902 18     Temp 01/08/20 0902 98.4 F (36.9 C)     Temp Source 01/08/20 0902 Oral     SpO2 01/08/20 0902 100 %     Weight 01/08/20 0903 175 lb (79.4 kg)     Height 01/08/20 0903 5\' 11"  (1.803 m)     Head Circumference --      Peak Flow --      Pain Score 01/08/20 0901 3     Pain Loc --      Pain Edu? --      Excl. in [place]? --    No data found.  Updated Vital Signs BP 133/80 (BP Location: Left Arm)   Pulse (!) 49   Temp 98.4 F (36.9 C) (Oral)   Resp 18   Ht 5\' 11"  (1.803 m)   Wt 175 lb (79.4 kg)   SpO2 100%   BMI 24.41 kg/m    Physical Exam Vitals and nursing note reviewed.  Constitutional:      General: He is not in acute distress.    Appearance: Normal appearance. He is well-developed and normal weight. He is not ill-appearing or toxic-appearing.  HENT:     Head: Normocephalic and atraumatic.  Eyes:     General: No scleral icterus.     Conjunctiva/sclera: Conjunctivae normal.  Cardiovascular:     Rate and Rhythm: Bradycardia present.     Pulses: Normal pulses.  Pulmonary:     Effort: Pulmonary effort is normal. No respiratory distress.  Musculoskeletal:     Cervical back: Neck supple.  Skin:    General: Skin is warm and dry.  Findings: Lesion present.     Comments: There are 3 erythematous papules about the left PIP joint of the middle finger. There is surrounding erythema and swelling. Skin is not warm to touch. There is no drainage from the papules. He has mildly limited range of motion at the PIP due to swelling. Good pulses and strength.  Neurological:     General: No focal deficit present.     Mental Status: He is alert. Mental status is at baseline.     Motor: No weakness.     Gait: Gait normal.  Psychiatric:        Mood and Affect: Mood normal.        Behavior: Behavior normal.        Thought Content: Thought content normal.          UC Treatments / Results  Labs (all labs ordered are listed, but only abnormal results are displayed) Labs Reviewed - No data to display  EKG   Radiology DG Finger Middle Left  Result Date: 01/08/2020 CLINICAL DATA:  Pain, redness, and swelling at LEFT middle finger for 1 day, no known injury EXAM: LEFT MIDDLE FINGER 2+V COMPARISON:  None FINDINGS: Diffuse soft tissue swelling LEFT middle finger. Osseous mineralization normal. Joint spaces preserved. Tiny dorsal spur at base of distal phalanx. No acute fracture, dislocation, or bone destruction. IMPRESSION: Soft tissue swelling LEFT middle finger with minimal degenerative changes at DIP joint. No acute osseous abnormalities. Electronically Signed   By: Lavonia Dana M.D.   On: 01/08/2020 09:40    Procedures Procedures (including critical care time)  Medications Ordered in UC Medications - No data to display  Initial Impression / Assessment and Plan / UC Course  I have reviewed the triage vital signs and the  nursing notes.  Pertinent labs & imaging results that were available during my care of the patient were reviewed by me and considered in my medical decision making (see chart for details).   Imaging not suggestive of underlying fracture, significant arthritis, foreign body.  Exam most consistent with insect sting and possible secondary localized reaction.  Cannot rule out bacterial infection so treating for both possibilities at this time.  Advised over-the-counter Benadryl every 6 hours, prednisone and Keflex.  Advised to follow-up for recheck in a couple days with PCP if possible.  Advised to follow-up sooner for any new or worsening symptoms including fever, increased pain or swelling or redness.  Patient understanding and agreeable.   Final Clinical Impressions(s) / UC Diagnoses   Final diagnoses:  Cellulitis of left middle finger  Swelling of left middle finger     Discharge Instructions     X-ray is normal today.  Based on your exam, I suspect insect bite or sting and secondary localized reaction.  Cannot rule out bacterial infection.  At this time cover you for both possibilities.  Begin prednisone and also Keflex.  He can ice the finger to help with swelling.  You should be seen again if the swelling spreads or you develop a fever or any worsening pain.  This should get better over the next 2 days but make sure you complete the full course of both medications.  Follow-up with our clinic as needed.    ED Prescriptions    Medication Sig Dispense Auth. Provider   predniSONE (DELTASONE) 10 MG tablet Take 6 tabs p.o. on day 1 and decrease by 1 tablet for the next 5 days. 21 tablet Danton Clap, PA-C  cephALEXin (KEFLEX) 500 MG capsule Take 1 capsule (500 mg total) by mouth 4 (four) times daily for 7 days. 28 capsule Danton Clap, PA-C     PDMP not reviewed this encounter.   Danton Clap, PA-C 01/08/20 [phone]

## 2020-01-08 NOTE — ED Triage Notes (Signed)
Patient in today c/o left middle finger pain, redness and swelling. Patient denies any known injury. Patient soaked is finger in [place]pson salt this morning.

## 2020-01-08 NOTE — Discharge Instructions (Signed)
X-ray is normal today.  Based on your exam, I suspect insect bite or sting and secondary localized reaction.  Cannot rule out bacterial infection.  At this time cover you for both possibilities.  Begin prednisone and also Keflex.  He can ice the finger to help with swelling.  You should be seen again if the swelling spreads or you develop a fever or any worsening pain.  This should get better over the next 2 days but make sure you complete the full course of both medications.  Follow-up with our clinic as needed.

## 2020-01-09 NOTE — Progress Notes (Signed)
Supervising: Shirl Harris

## 2020-02-04 ENCOUNTER — Ambulatory Visit (INDEPENDENT_AMBULATORY_CARE_PROVIDER_SITE_OTHER): Payer: Medicare Other

## 2020-02-04 ENCOUNTER — Telehealth: Payer: Self-pay

## 2020-02-04 VITALS — Ht 71.0 in | Wt 175.0 lb

## 2020-02-04 DIAGNOSIS — Z0000 Encounter for general adult medical examination without abnormal findings: Secondary | ICD-10-CM | POA: Diagnosis not present

## 2020-02-04 NOTE — Telephone Encounter (Signed)
Unsuccessful attempt to reach patient for scheduled awv. No answer. Left message asking for call back to office. Reschedule as appropriate.

## 2020-02-04 NOTE — Patient Instructions (Addendum)
Nathan Watts , Thank you for taking time to come for your Medicare Wellness Visit. I appreciate your ongoing commitment to your health goals. Please review the following plan we discussed and let me know if I can assist you in the future.   These are the goals we discussed: Goals    . Follow up with Primary Care Provider     As needed       This is a list of the screening recommended for you and due dates:  Health Maintenance  Topic Date Due  . Tetanus Vaccine  Never done  . Colon Cancer Screening  10/01/2024  . Flu Shot  Completed  . COVID-19 Vaccine  Completed  .  Hepatitis C: One time screening is recommended by Center for Disease Control  (CDC) for  adults born from [birth year] through 1966.   Completed  . Pneumonia vaccines  Completed    Immunizations Immunization History  Administered Date(s) Administered  . Fluad Quad(high Dose 65+) 12/12/2018, 01/04/2020  . Influenza Split 01/28/2012  . Influenza, High Dose Seasonal PF 01/24/2016, 02/04/2017, 01/16/2018  . Influenza,inj,Quad PF,6+ Mos 12/28/2012, 01/05/2014, 01/06/2015  . PFIZER SARS-COV-2 Vaccination 04/18/2019, 05/09/2019  . Pneumococcal Conjugate-13 01/23/2014  . Pneumococcal Polysaccharide-23 03/14/2012  . Zoster Recombinat (Shingrix) 11/02/2016, 03/09/2017   Keep all routine maintenance appointments.   Cpe 05/20/20   Advanced directives: End of life planning; Advance aging; Advanced directives discussed.  Copy of current HCPOA/Living Will requested.    Conditions/risks identified: none new  Follow up in one year for your annual wellness visit.   Preventive Care 39 Years and Older, Male Preventive care refers to lifestyle choices and visits with your health care provider that can promote health and wellness. What does preventive care include?  A yearly physical exam. This is also called an annual well check.  Dental exams once or twice a year.  Routine eye exams. Ask your health care provider how often you should  have your eyes checked.  Personal lifestyle choices, including:  Daily care of your teeth and gums.  Regular physical activity.  Eating a healthy diet.  Avoiding tobacco and drug use.  Limiting alcohol use.  Practicing safe sex.  Taking low doses of aspirin every day.  Taking vitamin and mineral supplements as recommended by your health care provider. What happens during an annual well check? The services and screenings done by your health care provider during your annual well check will depend on your age, overall health, lifestyle risk factors, and family history of disease. Counseling  Your health care provider may ask you questions about your:  Alcohol use.  Tobacco use.  Drug use.  Emotional well-being.  Home and relationship well-being.  Sexual activity.  Eating habits.  History of falls.  Memory and ability to understand (cognition).  Work and work Statistician. Screening  You may have the following tests or measurements:  Height, weight, and BMI.  Blood pressure.  Lipid and cholesterol levels. These may be checked every 5 years, or more frequently if you are over 59 years old.  Skin check.  Lung cancer screening. You may have this screening every year starting at age 84 if you have a 30-pack-year history of smoking and currently smoke or have quit within the past 15 years.  Fecal occult blood test (FOBT) of the stool. You may have this test every year starting at age 67.  Flexible sigmoidoscopy or colonoscopy. You may have a sigmoidoscopy every 5 years or a colonoscopy every 10  years starting at age 42.  Prostate cancer screening. Recommendations will vary depending on your family history and other risks.  Hepatitis C blood test.  Hepatitis B blood test.  Sexually transmitted disease (STD) testing.  Diabetes screening. This is done by checking your blood sugar (glucose) after you have not eaten for a while (fasting). You may have this done  every 1-3 years.  Abdominal aortic aneurysm (AAA) screening. You may need this if you are a current or former smoker.  Osteoporosis. You may be screened starting at age 43 if you are at high risk. Talk with your health care provider about your test results, treatment options, and if necessary, the need for more tests. Vaccines  Your health care provider may recommend certain vaccines, such as:  Influenza vaccine. This is recommended every year.  Tetanus, diphtheria, and acellular pertussis (Tdap, Td) vaccine. You may need a Td booster every 10 years.  Zoster vaccine. You may need this after age 36.  Pneumococcal 13-valent conjugate (PCV13) vaccine. One dose is recommended after age 66.  Pneumococcal polysaccharide (PPSV23) vaccine. One dose is recommended after age 22. Talk to your health care provider about which screenings and vaccines you need and how often you need them. This information is not intended to replace advice given to you by your health care provider. Make sure you discuss any questions you have with your health care provider. Document Released: 04/25/2015 Document Revised: 12/17/2015 Document Reviewed: 01/28/2015 Elsevier Interactive Patient Education  2017 [place] Prevention in the Home Falls can cause injuries. They can happen to people of all ages. There are many things you can do to make your home safe and to help prevent falls. What can I do on the outside of my home?  Regularly fix the edges of walkways and driveways and fix any cracks.  Remove anything that might make you trip as you walk through a door, such as a raised step or threshold.  Trim any bushes or trees on the path to your home.  Use bright outdoor lighting.  Clear any walking paths of anything that might make someone trip, such as rocks or tools.  Regularly check to see if handrails are loose or broken. Make sure that both sides of any steps have handrails.  Any raised decks and  porches should have guardrails on the edges.  Have any leaves, snow, or ice cleared regularly.  Use sand or salt on walking paths during winter.  Clean up any spills in your garage right away. This includes oil or grease spills. What can I do in the bathroom?  Use night lights.  Install grab bars by the toilet and in the tub and shower. Do not use towel bars as grab bars.  Use non-skid mats or decals in the tub or shower.  If you need to sit down in the shower, use a plastic, non-slip stool.  Keep the floor dry. Clean up any water that spills on the floor as soon as it happens.  Remove soap buildup in the tub or shower regularly.  Attach bath mats securely with double-sided non-slip rug tape.  Do not have throw rugs and other things on the floor that can make you trip. What can I do in the bedroom?  Use night lights.  Make sure that you have a light by your bed that is easy to reach.  Do not use any sheets or blankets that are too big for your bed. They should not hang  down onto the floor.  Have a firm chair that has side arms. You can use this for support while you get dressed.  Do not have throw rugs and other things on the floor that can make you trip. What can I do in the kitchen?  Clean up any spills right away.  Avoid walking on wet floors.  Keep items that you use a lot in easy-to-reach places.  If you need to reach something above you, use a strong step stool that has a grab bar.  Keep electrical cords out of the way.  Do not use floor polish or wax that makes floors slippery. If you must use wax, use non-skid floor wax.  Do not have throw rugs and other things on the floor that can make you trip. What can I do with my stairs?  Do not leave any items on the stairs.  Make sure that there are handrails on both sides of the stairs and use them. Fix handrails that are broken or loose. Make sure that handrails are as long as the stairways.  Check any  carpeting to make sure that it is firmly attached to the stairs. Fix any carpet that is loose or worn.  Avoid having throw rugs at the top or bottom of the stairs. If you do have throw rugs, attach them to the floor with carpet tape.  Make sure that you have a light switch at the top of the stairs and the bottom of the stairs. If you do not have them, ask someone to add them for you. What else can I do to help prevent falls?  Wear shoes that:  Do not have high heels.  Have rubber bottoms.  Are comfortable and fit you well.  Are closed at the toe. Do not wear sandals.  If you use a stepladder:  Make sure that it is fully opened. Do not climb a closed stepladder.  Make sure that both sides of the stepladder are locked into place.  Ask someone to hold it for you, if possible.  Clearly mark and make sure that you can see:  Any grab bars or handrails.  First and last steps.  Where the edge of each step is.  Use tools that help you move around (mobility aids) if they are needed. These include:  Canes.  Walkers.  Scooters.  Crutches.  Turn on the lights when you go into a dark area. Replace any light bulbs as soon as they burn out.  Set up your furniture so you have a clear path. Avoid moving your furniture around.  If any of your floors are uneven, fix them.  If there are any pets around you, be aware of where they are.  Review your medicines with your doctor. Some medicines can make you feel dizzy. This can increase your chance of falling. Ask your doctor what other things that you can do to help prevent falls. This information is not intended to replace advice given to you by your health care provider. Make sure you discuss any questions you have with your health care provider. Document Released: 01/23/2009 Document Revised: 09/04/2015 Document Reviewed: 05/03/2014 Elsevier Interactive Patient Education  2017 [employer].

## 2020-02-04 NOTE — Progress Notes (Addendum)
Subjective:   Nathan Watts is a 74 y.o. male who presents for an Initial Medicare Annual Wellness Visit.  Review of Systems    No ROS.  Medicare Wellness Virtual Visit.  Cardiac Risk Factors include: advanced age (>35men, >35 women);male gender;hypertension     Objective:    Today's Vitals   02/04/20 0851  Weight: 175 lb (79.4 kg)  Height: 5\' 11"  (1.803 m)   Body mass index is 24.41 kg/m.  Advanced Directives 02/04/2020 12/14/2019 02/01/2019 08/03/2016 04/20/2016 06/04/2015  Does Patient Have a Medical Advance Directive? Yes Yes Yes Yes Yes No  Type of Paramedic of [place];Living will [place];Living will [place];Living will [place];Living will Living will -  Does patient want to make changes to medical advance directive? No - Patient declined - No - Patient declined - No - Patient declined -  Copy of [place] in Chart? No - copy requested - No - copy requested - - -  Would patient like information on creating a medical advance directive? - - - - - No - patient declined information    Current Medications (verified) Outpatient Encounter Medications as of 02/04/2020  Medication Sig  . aspirin 81 MG tablet Take 81 mg by mouth daily.  Marland Kitchen atorvastatin (LIPITOR) 10 MG tablet TAKE 1 TABLET BY MOUTH DAILY  . finasteride (PROSCAR) 5 MG tablet Take 1 tablet (5 mg total) by mouth daily.  . Lactobacillus (PROBIOTIC ACIDOPHILUS PO) Take by mouth daily.  . metroNIDAZOLE (METROGEL) 0.75 % gel Apply topically 2 (two) times daily. to face  . Multiple Vitamin (MULTI VITAMIN MENS PO) Take 1 tablet by mouth daily.  . Omega-3 Fatty Acids (FISH OIL) 1200 MG CAPS Take 1 capsule by mouth daily.  . Omeprazole Magnesium (PRILOSEC OTC PO) Take by mouth daily.  . pimecrolimus (ELIDEL) 1 % cream APPLY TWICE DAILY AS NEEDED FOR FLARES  . predniSONE (DELTASONE) 10 MG tablet Take 6 tabs p.o. on day 1 and  decrease by 1 tablet for the next 5 days.  . vitamin B-12 (CYANOCOBALAMIN) 500 MCG tablet Take 500 mcg by mouth daily.   No facility-administered encounter medications on file as of 02/04/2020.    Allergies (verified) Hydrocodone-chlorpheniramine and Crestor [rosuvastatin]   History: Past Medical History:  Diagnosis Date  . Anemia, unspecified   . BPH (benign prostatic hyperplasia)   . GERD (gastroesophageal reflux disease)   . Hypercholesterolemia   . Hyperglycemia   . Hypertension   . Thrombocytopenia ([place])   . Thyroid nodule    Past Surgical History:  Procedure Laterality Date  . COLONOSCOPY WITH PROPOFOL N/A 10/02/2014   Procedure: COLONOSCOPY WITH PROPOFOL;  Surgeon: Manya Silvas, MD;  Location: [facility] ENDOSCOPY;  Service: Endoscopy;  Laterality: N/A;  . INGUINAL HERNIA REPAIR    . TONSILLECTOMY     Family History  Problem Relation Age of Onset  . Prostate cancer Father   . Heart disease Father        s/p CABG  . Stroke Mother   . Colon cancer Neg Hx    Social History   Socioeconomic History  . Marital status: Married    Spouse name: Not on file  . Number of children: 4  . Years of education: Not on file  . Highest education level: Not on file  Occupational History  . Occupation: retired  Tobacco Use  . Smoking status: Former Smoker    Packs/day: 1.00  Years: 30.00    Pack years: 30.00    Types: Cigarettes    Quit date: 04/12/1998    Years since quitting: 21.8  . Smokeless tobacco: Never Used  Vaping Use  . Vaping Use: Never used  Substance and Sexual Activity  . Alcohol use: Yes    Alcohol/week: 0.0 standard drinks    Comment: OCC  . Drug use: Never  . Sexual activity: Never  Other Topics Concern  . Not on file  Social History Narrative   He is married. Has four children   Social Determinants of Health   Financial Resource Strain: Low Risk   . Difficulty of Paying Living Expenses: Not hard at all  Food Insecurity: No Food Insecurity  .  Worried About Charity fundraiser in the Last Year: Never true  . Ran Out of Food in the Last Year: Never true  Transportation Needs: No Transportation Needs  . Lack of Transportation (Medical): No  . Lack of Transportation (Non-Medical): No  Physical Activity: Sufficiently Active  . Days of Exercise per Week: 7 days  . Minutes of Exercise per Session: 120 min  Stress: No Stress Concern Present  . Feeling of Stress : Not at all  Social Connections:   . Frequency of Communication with Friends and Family: Not on file  . Frequency of Social Gatherings with Friends and Family: Not on file  . Attends Religious Services: Not on file  . Active Member of Clubs or Organizations: Not on file  . Attends Archivist Meetings: Not on file  . Marital Status: Not on file    Tobacco Counseling Counseling given: Not Answered   Clinical Intake:  Pre-visit preparation completed: Yes        Diabetes: No  How often do you need to have someone help you when you read instructions, pamphlets, or other written materials from your doctor or pharmacy?: 1 - Never Interpreter Needed?: No      Activities of Daily Living In your present state of health, do you have any difficulty performing the following activities: 02/04/2020  Hearing? N  Vision? N  Difficulty concentrating or making decisions? N  Walking or climbing stairs? N  Dressing or bathing? N  Doing errands, shopping? N  Preparing Food and eating ? N  Using the Toilet? N  In the past six months, have you accidently leaked urine? N  Do you have problems with loss of bowel control? N  Managing your Medications? N  Managing your Finances? N  Housekeeping or managing your Housekeeping? N  Some recent data might be hidden    Patient Care Team: Einar Pheasant, MD as PCP - General (Internal Medicine)  Indicate any recent Medical Services you may have received from other than [facility] providers in the past year (date may be  approximate).     Assessment:   This is a routine wellness examination for Nathan Watts.  I connected with Nathan Watts today by telephone and verified that I am speaking with the correctrson using two identifiers. Location patient: home Location provider: work Persons participating in the virtual visit: patient, Marine scientist.    I discussed the limitations, risks, security and privacy concerns of performing an evaluation and management service by telephone and the availability of in person appointments. The patient expressed understanding and verbally consented to this telephonic visit.    Interactive audio and video telecommunications were attempted between this provider and patient, however failed, due to patient having technical difficulties OR patient did not  have access to video capability.  We continued and completed visit with audio only.  Some vital signs may be absent or patient reported.   Hearing/Vision screen  Hearing Screening   125Hz  250Hz  500Hz  1000Hz  2000Hz  3000Hz  4000Hz  6000Hz  8000Hz   Right ear:           Left ear:           Comments: Patient is able to hear conversational tones without difficulty.  No issues reported.   Vision Screening Comments: Wears corrective lenses Visual acuity not assessed, virtual visit.  They have seen their ophthalmologist in the last 12 months.     Dietary issues and exercise activities discussed: Current Exercise Habits: Home exercise routine, Type of exercise: walking, Time (Minutes): > 60, Frequency (Times/Week): 7, Weekly Exercise (Minutes/Week): 0, Intensity: Mild  Goals    . Follow up with Primary Care Provider     As needed      Depression Screen PHQ 2/9 Scores 02/04/2020 11/13/2019 02/01/2019 11/08/2018 05/03/2017 04/20/2016 01/06/2015  PHQ - 2 Score 0 0 0 0 0 0 0    Fall Risk Fall Risk  02/04/2020 11/13/2019 05/15/2019 02/01/2019 04/20/2016  Falls in the past year? 0 0 0 0 No  Number falls in past yr: 0 - - - -  Injury with Fall? 0 - - - -    Comment - - - - -  Follow up Falls evaluation completed Falls evaluation completed Falls evaluation completed - -   Handrails in use when climbing stairs? Yes Home free of loose throw rugs in walkways, pet beds, electrical cords, etc? Yes  Adequate lighting in your home to reduce risk of falls? Yes   ASSISTIVE DEVICES UTILIZED TO PREVENT FALLS: Use of a cane, walker or w/c? No   TIMED UP AND GO: Was the test performed? No . Virtual visit.   Cognitive Function:  Patient is alert and oriented x3.  Denies difficulty focusing, making decisions, memory loss.  Enjoys reading for brain health.    6CIT Screen 02/01/2019  What Year? 0 points  What month? 0 points  What time? 0 points  Count back from 20 0 points  Months in reverse 0 points  Repeat phrase 0 points  Total Score 0    Immunizations Immunization History  Administered Date(s) Administered  . Fluad Quad(high Dose 65+) 12/12/2018, 01/04/2020  . Influenza Split 01/28/2012  . Influenza, High Dose Seasonal PF 01/24/2016, 02/04/2017, 01/16/2018  . Influenza,inj,Quad PF,6+ Mos 12/28/2012, 01/05/2014, 01/06/2015  . PFIZER SARS-COV-2 Vaccination 04/18/2019, 05/09/2019  . Pneumococcal Conjugate-13 01/23/2014  . Pneumococcal Polysaccharide-23 03/14/2012  . Zoster Recombinat (Shingrix) 11/02/2016, 03/09/2017   TDAP status: Due, Education has been provided regarding the importance of this vaccine. Advised may receive this vaccine at local pharmacy or Health Dept. Aware to provide a copy of the vaccination record if obtained from local pharmacy or Health Dept. Verbalized acceptance and understanding. Deferred.   Health Maintenance Health Maintenance  Topic Date Due  . TETANUS/TDAP  02/03/2021 (Originally 09/09/1964)  . COLONOSCOPY  10/01/2024  . INFLUENZA VACCINE  Completed  . COVID-19 Vaccine  Completed  . Hepatitis C Screening  Completed  . PNA vac Low Risk Adult  Completed    Dental Screening: Recommended annual dental  exams for proper oral hygiene  Community Resource Referral / Chronic Care Management: CRR required this visit?  No   CCM required this visit?  No      Plan:    Keep all routine maintenance appointments.  Cpe 05/20/20   I have personally reviewed and noted the following in the patient's chart:   . Medical and social history . Use of alcohol, tobacco or illicit drugs  . Current medications and supplements . Functional ability and status . Nutritional status . Physical activity . Advanced directives . List of other physicians . Hospitalizations, surgeries, and ER visits in previous 12 months . Vitals . Screenings to include cognitive, depression, and falls . Referrals and appointments  In addition, I have reviewed and discussed with patient certain preventive protocols, quality metrics, and best practice recommendations. A written personalized care plan for preventive services as well as general preventive health recommendations were provided to patient via mychart.     Varney Biles, LPN   [id number]     Reviewed above information.  Agree with assessment and plan.   Dr Nicki Reaper

## 2020-02-22 ENCOUNTER — Ambulatory Visit: Payer: Medicare Other | Admitting: Internal Medicine

## 2020-02-27 DIAGNOSIS — H2513 Age-related nuclear cataract, bilateral: Secondary | ICD-10-CM | POA: Diagnosis not present

## 2020-03-31 ENCOUNTER — Other Ambulatory Visit: Payer: Medicare Other

## 2020-03-31 ENCOUNTER — Other Ambulatory Visit: Payer: Self-pay

## 2020-03-31 DIAGNOSIS — R972 Elevated prostate specific antigen [PSA]: Secondary | ICD-10-CM

## 2020-04-01 LAB — PSA: Prostate Specific Ag, Serum: 1.4 ng/mL (ref 0.0–4.0)

## 2020-04-02 ENCOUNTER — Ambulatory Visit: Payer: Medicare Other | Admitting: Urology

## 2020-04-02 ENCOUNTER — Encounter: Payer: Self-pay | Admitting: Urology

## 2020-04-02 ENCOUNTER — Other Ambulatory Visit: Payer: Self-pay

## 2020-04-02 VITALS — BP 148/74 | HR 46 | Ht 71.0 in | Wt 175.0 lb

## 2020-04-02 DIAGNOSIS — N401 Enlarged prostate with lower urinary tract symptoms: Secondary | ICD-10-CM | POA: Diagnosis not present

## 2020-04-02 DIAGNOSIS — N138 Other obstructive and reflux uropathy: Secondary | ICD-10-CM | POA: Diagnosis not present

## 2020-04-02 DIAGNOSIS — Z87898 Personal history of other specified conditions: Secondary | ICD-10-CM | POA: Diagnosis not present

## 2020-04-02 MED ORDER — FINASTERIDE 5 MG PO TABS
5.0000 mg | ORAL_TABLET | Freq: Every day | ORAL | 3 refills | Status: DC
Start: 2020-04-02 — End: 2021-02-12

## 2020-04-02 NOTE — Progress Notes (Signed)
04/02/2020 9:11 AM   Nathan Watts [birth year] [id number]  Referring provider: Einar Pheasant, [id number] [place],  [place] [address]  Chief Complaint  Patient presents with  . Benign Prostatic Hypertrophy  . Elevated PSA     Urologic History 1. Benign Prostatic Hypertrophy - On finasteride  2. History of Elevated PSA - One prior PA slightly elevated, returned to baseline - Prostate Biopsy (04/2015); PSA 4.25; 97 gram prostate; benign pathology  HPI: 74 y.o. male presents for annual follow-up.   Since last years visit has noted increased frequency and urgency with nocturia every 2 hours  Denies dysuria, gross hematuria  No flank, abdominal or pelvic pain  PSA 03/31/2020 was stable at 1.4 (uncorrected)   PMH: Past Medical History:  Diagnosis Date  . Anemia, unspecified   . BPH (benign prostatic hyperplasia)   . GERD (gastroesophageal reflux disease)   . Hypercholesterolemia   . Hyperglycemia   . Hypertension   . Thrombocytopenia ([place])   . Thyroid nodule     Surgical History: Past Surgical History:  Procedure Laterality Date  . COLONOSCOPY WITH PROPOFOL N/A 10/02/2014   Procedure: COLONOSCOPY WITH PROPOFOL;  Surgeon: Manya Silvas, MD;  Location: [facility] ENDOSCOPY;  Service: Endoscopy;  Laterality: N/A;  . INGUINAL HERNIA REPAIR    . TONSILLECTOMY      Home Medications:  Allergies as of 04/02/2020      Reactions   Hydrocodone-chlorpheniramine Other (See Comments)   Unknown, possible causes anxiety  possible causes anxiety Unknown, possible causes anxiety   Crestor [rosuvastatin] Rash   Causes GI upset and anxiety      Medication List       Accurate as of April 02, 2020  9:11 AM. If you have any questions, ask your nurse or doctor.        aspirin 81 MG tablet Take 81 mg by mouth daily.   atorvastatin 10 MG tablet Commonly known as: LIPITOR TAKE 1 TABLET BY MOUTH DAILY    finasteride 5 MG tablet Commonly known as: PROSCAR Take 1 tablet (5 mg total) by mouth daily.   Fish Oil 1200 MG Caps Take 1 capsule by mouth daily.   metroNIDAZOLE 0.75 % gel Commonly known as: METROGEL Apply topically 2 (two) times daily. to face   MULTI VITAMIN MENS PO Take 1 tablet by mouth daily.   pimecrolimus 1 % cream Commonly known as: ELIDEL APPLY TWICE DAILY AS NEEDED FOR FLARES   predniSONE 10 MG tablet Commonly known as: DELTASONE Take 6 tabs p.o. on day 1 and decrease by 1 tablet for the next 5 days.   PRILOSEC OTC PO Take by mouth daily.   PROBIOTIC ACIDOPHILUS PO Take by mouth daily.   vitamin B-12 500 MCG tablet Commonly known as: CYANOCOBALAMIN Take 500 mcg by mouth daily.       Allergies:  Allergies  Allergen Reactions  . Hydrocodone-Chlorpheniramine Other (See Comments)    Unknown, possible causes anxiety  possible causes anxiety Unknown, possible causes anxiety  . Crestor [Rosuvastatin] Rash    Causes GI upset and anxiety    Family History: Family History  Problem Relation Age of Onset  . Prostate cancer Father   . Heart disease Father        s/p CABG  . Stroke Mother   . Colon cancer Neg Hx     Social History:  reports that he quit smoking about 21 years ago. His smoking use included cigarettes. He has a 30.00 pack-year  smoking history. He has never used smokeless tobacco. He reports current alcohol use. He reports that he does not use drugs.   Physical Exam: BP (!) 148/74   Pulse (!) 46   Ht 5\' 11"  (1.803 m)   Wt 175 lb (79.4 kg)   BMI 24.41 kg/m   Constitutional:  Alert and oriented, No acute distress. HEENT: [place] AT, moist mucus membranes.  Trachea midline, no masses. Cardiovascular: No clubbing, cyanosis, or edema. Respiratory: Normal respiratory effort, no increased work of breathing. Skin: No rashes, bruises or suspicious lesions. Neurologic: Grossly intact, no focal deficits, moving all 4 extremities. Psychiatric:  Normal mood and affect.   Assessment & Plan:    1.  BPH with LUTS  Some increased storage related voiding symptoms and nocturia  Symptoms are not bothersome enough that he desires additional medication or surgical options  Continue finasteride-refill sent  2.  History elevated PSA  Stable PSA in normal range  Continue annual follow-up   Abbie Sons, MD  [facility] [address] [place], [place] [address] [phone]

## 2020-05-05 ENCOUNTER — Other Ambulatory Visit: Payer: Self-pay

## 2020-05-05 ENCOUNTER — Ambulatory Visit
Admission: EM | Admit: 2020-05-05 | Discharge: 2020-05-05 | Disposition: A | Discharge status: Home / Self Care | Payer: Medicare Other | Attending: Family Medicine | Admitting: Family Medicine

## 2020-05-05 ENCOUNTER — Ambulatory Visit: Payer: Medicare Other | Admitting: Internal Medicine

## 2020-05-05 ENCOUNTER — Other Ambulatory Visit: Payer: Self-pay | Admitting: Internal Medicine

## 2020-05-05 DIAGNOSIS — S39012A Strain of muscle, fascia and tendon of lower back, initial encounter: Secondary | ICD-10-CM

## 2020-05-05 LAB — URINALYSIS, COMPLETE (UACMP) WITH MICROSCOPIC
Bacteria, UA: NONE SEEN
Bilirubin Urine: NEGATIVE
Glucose, UA: NEGATIVE mg/dL
Hgb urine dipstick: NEGATIVE
Ketones, ur: NEGATIVE mg/dL
Leukocytes,Ua: NEGATIVE
Nitrite: NEGATIVE
Protein, ur: NEGATIVE mg/dL
RBC / HPF: NONE SEEN RBC/hpf (ref 0–5)
Specific Gravity, Urine: 1.015 (ref 1.005–1.030)
pH: 5.5 (ref 5.0–8.0)

## 2020-05-05 MED ORDER — METHOCARBAMOL 500 MG PO TABS: 500.0000 mg | ORAL_TABLET | Freq: Two times a day (BID) | ORAL | 0 refills | Status: DC

## 2020-05-05 MED ORDER — IBUPROFEN 600 MG PO TABS: 600.0000 mg | ORAL_TABLET | Freq: Four times a day (QID) | ORAL | 0 refills | Status: DC | PRN

## 2020-05-05 NOTE — ED Triage Notes (Signed)
Pt presents with c/o lower-mid back pain about 4 days. Pt reports he had similar symptoms several months ago and was dx'd with a UTI. Pt denies any current urinary symptoms, n/v/d/f.

## 2020-05-05 NOTE — ED Provider Notes (Signed)
[facility]    CSN: [id number] Arrival date & time: 05/05/20  1155      History   Chief Complaint Chief Complaint  Patient presents with  . Back Pain    lower    HPI Nathan Watts is a 75 y.o. male.   HPI   75 year old male here for evaluation of right-sided low back pain that started 4 days ago.  Patient ports that it usually worse when he gets up first in the morning but as he goes throughout the day it improves.  Patient denies pain on the left side.  Patient reports that a few weeks ago he had similar symptoms and was found that he had a UTI at that time.  Patient states that he does urinate more frequently but that is his baseline.  Patient denies urgency or pain with urination.  No blood with urination.  No fever, nausea, or vomiting.  Simply patient denies any heavy lifting or injury to his low back.  Past Medical History:  Diagnosis Date  . Anemia, unspecified   . BPH (benign prostatic hyperplasia)   . GERD (gastroesophageal reflux disease)   . Hypercholesterolemia   . Hyperglycemia   . Hypertension   . Thrombocytopenia ([place])   . Thyroid nodule     Patient Active Problem List   Diagnosis Date Noted  . Change in bowel movement 05/20/2019  . BPH with obstruction/lower urinary tract symptoms 03/27/2018  . Cough 11/20/2017  . Rib pain 10/31/2017  . 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 Elevated PSA 07/14/2015  . Aortic atherosclerosis ([place]) 03/13/2015  . Coronary artery disease 03/13/2015  . History of tobacco abuse 03/13/2015  . Hypertension, essential 03/05/2015  . Health care maintenance 10/09/2014  . Plantar fasciitis 12/31/2012  . Thyroid nodule 03/14/2012  . Hyperglycemia 03/14/2012  . Hypercholesterolemia 03/14/2012    Past Surgical History:  Procedure Laterality Date  . COLONOSCOPY WITH PROPOFOL N/A 10/02/2014   Procedure: COLONOSCOPY WITH PROPOFOL;  Surgeon: Manya Silvas, MD;  Location: [facility] ENDOSCOPY;  Service: Endoscopy;   Laterality: N/A;  . INGUINAL HERNIA REPAIR    . TONSILLECTOMY         Home Medications    Prior to Admission medications   Medication Sig Start Date End Date Taking? Authorizing Provider  aspirin 81 MG tablet Take 81 mg by mouth daily.   Yes [provider]  atorvastatin (LIPITOR) 10 MG tablet TAKE 1 TABLET BY MOUTH DAILY 11/15/19  Yes Einar Pheasant, MD  ibuprofen (ADVIL) 600 MG tablet Take 1 tablet (600 mg total) by mouth every 6 (six) hours as needed. 05/05/20  Yes Margarette Canada, NP  Lactobacillus (PROBIOTIC ACIDOPHILUS PO) Take by mouth daily.   Yes [provider]  methocarbamol (ROBAXIN) 500 MG tablet Take 1 tablet (500 mg total) by mouth 2 (two) times daily. 05/05/20  Yes Margarette Canada, NP  metroNIDAZOLE (METROGEL) 0.75 % gel Apply topically 2 (two) times daily. to face 04/27/19  Yes [provider]  Multiple Vitamin (MULTI VITAMIN MENS PO) Take 1 tablet by mouth daily.   Yes [provider]  Omega-3 Fatty Acids (FISH OIL) 1200 MG CAPS Take 1 capsule by mouth daily.   Yes [provider]  Omeprazole Magnesium (PRILOSEC OTC PO) Take by mouth daily.   Yes [provider]  pimecrolimus (ELIDEL) 1 % cream APPLY TWICE DAILY AS NEEDED FOR FLARES 04/26/19  Yes [provider]  vitamin B-12 (CYANOCOBALAMIN) 500 MCG tablet Take 500 mcg by  mouth daily.   Yes [provider]  finasteride (PROSCAR) 5 MG tablet Take 1 tablet (5 mg total) by mouth daily. 04/02/20   Abbie Sons, MD    Family History Family History  Problem Relation Age of Onset  . Prostate cancer Father   . Heart disease Father        s/p CABG  . Stroke Mother   . Colon cancer Neg Hx     Social History Social History   Tobacco Use  . Smoking status: Former Smoker    Packs/day: 1.00    Years: 30.00    Pack years: 30.00    Types: Cigarettes    Quit date: 04/12/1998    Years since quitting: 22.0  . Smokeless tobacco: Never Used  Vaping Use  .  Vaping Use: Never used  Substance Use Topics  . Alcohol use: Yes    Alcohol/week: 0.0 standard drinks    Comment: OCC  . Drug use: Never     Allergies   Hydrocodone-chlorpheniramine and Crestor [rosuvastatin]   Review of Systems Review of Systems  Constitutional: Negative for activity change, appetite change and fever.  Gastrointestinal: Negative for nausea and vomiting.  Genitourinary: Positive for frequency. Negative for dysuria, hematuria and urgency.  Musculoskeletal: Positive for back pain.  Skin: Negative for rash.  Hematological: Negative.   Psychiatric/Behavioral: Negative.      Physical Exam Triage Vital Signs ED Triage Vitals  Enc Vitals Group     BP 05/05/20 1307 135/63     Pulse Rate 05/05/20 1307 (!) 48     Resp 05/05/20 1307 18     Temp 05/05/20 1307 98.1 F (36.7 C)     Temp Source 05/05/20 1307 Oral     SpO2 05/05/20 1307 99 %     Weight 05/05/20 1302 175 lb (79.4 kg)     Height 05/05/20 1302 5\' 11"  (1.803 m)     Head Circumference --      Peak Flow --      Pain Score 05/05/20 1302 2     Pain Loc --      Pain Edu? --      Excl. in [place]? --    No data found.  Updated Vital Signs BP 135/63 (BP Location: Left Arm)   Pulse (!) 48   Temp 98.1 F (36.7 C) (Oral)   Resp 18   Ht 5\' 11"  (1.803 m)   Wt 175 lb (79.4 kg)   SpO2 99%   BMI 24.41 kg/m   Visual Acuity Right Eye Distance:   Left Eye Distance:   Bilateral Distance:    Right Eye Near:   Left Eye Near:    Bilateral Near:     Physical Exam Vitals and nursing note reviewed.  Constitutional:      General: He is not in acute distress.    Appearance: Normal appearance. He is normal weight. He is not toxic-appearing.  HENT:     Head: Normocephalic and atraumatic.  Cardiovascular:     Rate and Rhythm: Normal rate and regular rhythm.     Pulses: Normal pulses.     Heart sounds: Normal heart sounds. No murmur heard. No gallop.   Pulmonary:     Effort: Pulmonary effort is normal.      Breath sounds: Normal breath sounds. No wheezing, rhonchi or rales.  Abdominal:     Tenderness: There is no right CVA tenderness or left CVA tenderness.  Musculoskeletal:  General: Tenderness present.  Skin:    General: Skin is warm and dry.     Capillary Refill: Capillary refill takes less than 2 seconds.     Findings: No erythema or rash.  Neurological:     General: No focal deficit present.     Mental Status: He is alert and oriented to person, place, and time.  Psychiatric:        Mood and Affect: Mood normal.        Behavior: Behavior normal.        Thought Content: Thought content normal.        Judgment: Judgment normal.      UC Treatments / Results  Labs (all labs ordered are listed, but only abnormal results are displayed) Labs Reviewed  URINALYSIS, COMPLETE (UACMP) WITH MICROSCOPIC    EKG   Radiology No results found.  Procedures Procedures (including critical care time)  Medications Ordered in UC Medications - No data to display  Initial Impression / Assessment and Plan / UC Course  I have reviewed the triage vital signs and the nursing notes.  Pertinent labs & imaging results that were available during my care of the patient were reviewed by me and considered in my medical decision making (see chart for details).   Patient is here for evaluation of right-sided low back pain that started 4 days ago.  He is unaware of any kind of injury he has not been doing any heavy lifting.  Patient is concerned because he had similar pain a few weeks ago and turned out that he had a UTI.  Patient's physical exam reveals tenderness and spasm to his left lumbar region.  The pain is reproducible when palpating the area of spasm.  Patient has no radiation to his right leg.  Physical exam is more consistent with lumbar strain than with UTI.  Will collect urine for analysis just to rule out infectious process.  UA is completely negative.  We will treat patient for lumbar  strain with ibuprofen and methocarbamol as well as home PT.   Final Clinical Impressions(s) / UC Diagnoses   Final diagnoses:  Strain of lumbar region, initial encounter     Discharge Instructions     The ibuprofen every 6 hours on a schedule for the next 2 days and then you can use it on as-needed basis.  Take the methocarbamol with the ibuprofen every 6 hours on a schedule for the next 2 days and then after that you can use it on an as-needed basis.  Follow the back exercises given in your discharge paperwork.  If your symptoms do not resolve or improve return for reevaluation or see your primary care provider.    ED Prescriptions    Medication Sig Dispense Auth. Provider   ibuprofen (ADVIL) 600 MG tablet Take 1 tablet (600 mg total) by mouth every 6 (six) hours as needed. 30 tablet Margarette Canada, NP   methocarbamol (ROBAXIN) 500 MG tablet Take 1 tablet (500 mg total) by mouth 2 (two) times daily. 20 tablet Margarette Canada, NP     PDMP not reviewed this encounter.   Margarette Canada, NP 05/05/20 1332

## 2020-05-05 NOTE — Discharge Instructions (Addendum)
The ibuprofen every 6 hours on a schedule for the next 2 days and then you can use it on as-needed basis.  Take the methocarbamol with the ibuprofen every 6 hours on a schedule for the next 2 days and then after that you can use it on an as-needed basis.  Follow the back exercises given in your discharge paperwork.  If your symptoms do not resolve or improve return for reevaluation or see your primary care provider.

## 2020-05-15 IMAGING — DX DG LUMBAR SPINE 2-3V
3 series · 3 of 3 positions shown · non-contrast
Comparison: None.

CLINICAL DATA: Low back pain for 2 weeks, no known injury, initial
encounter

EXAM:
LUMBAR SPINE - 3 VIEW

[lumbar spine ap]
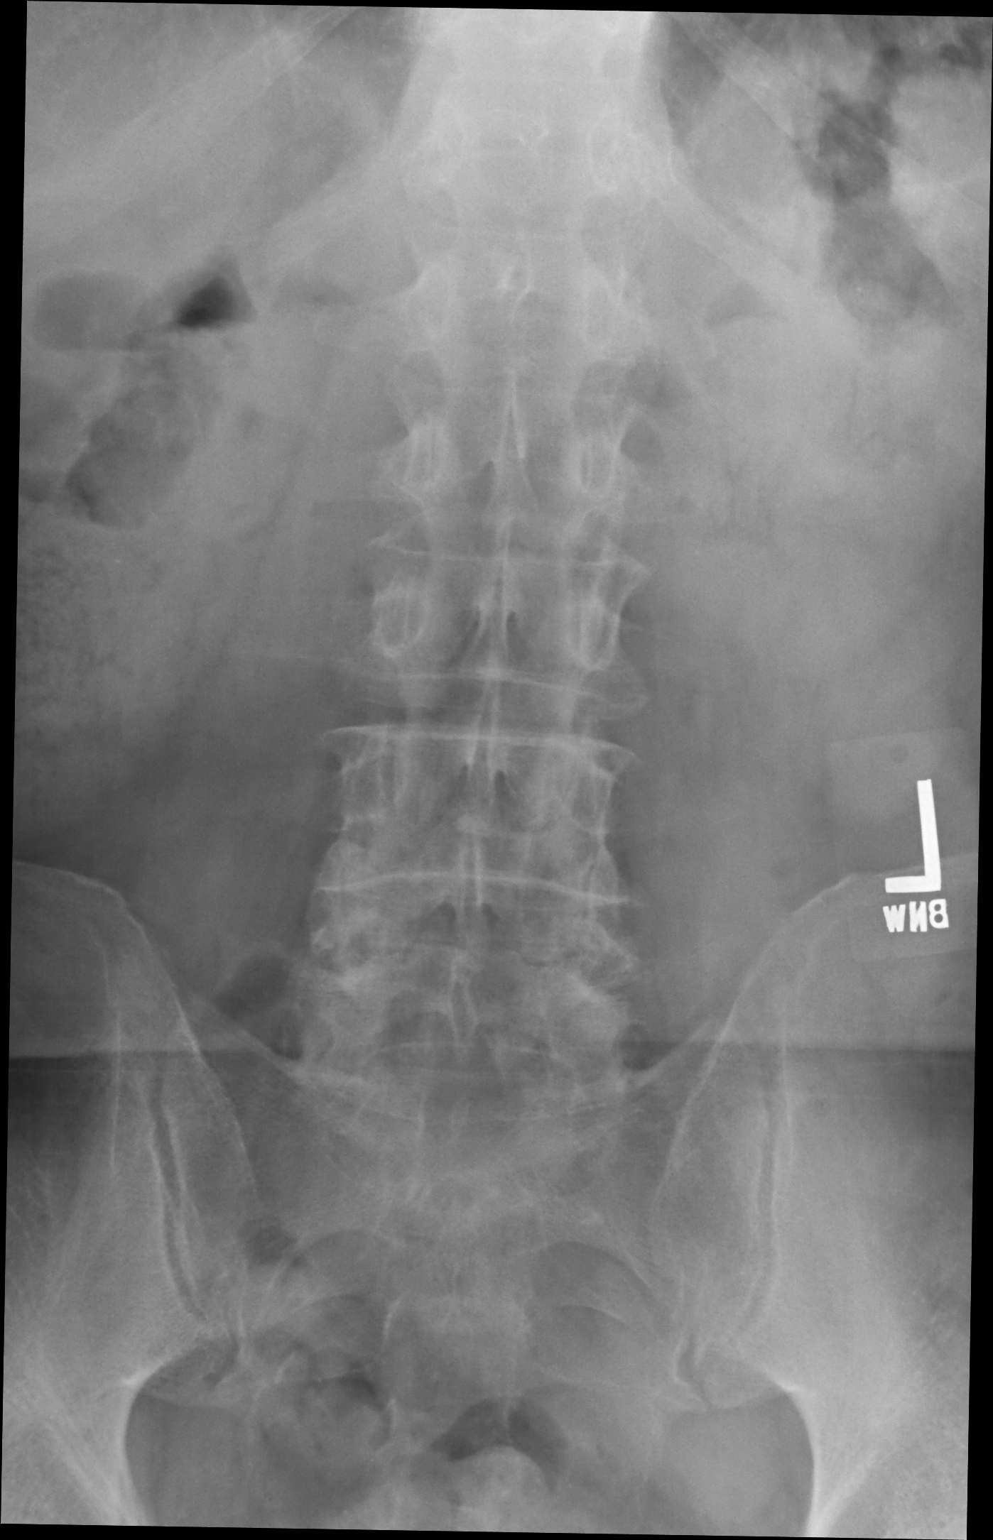

[lumbar spine lat]
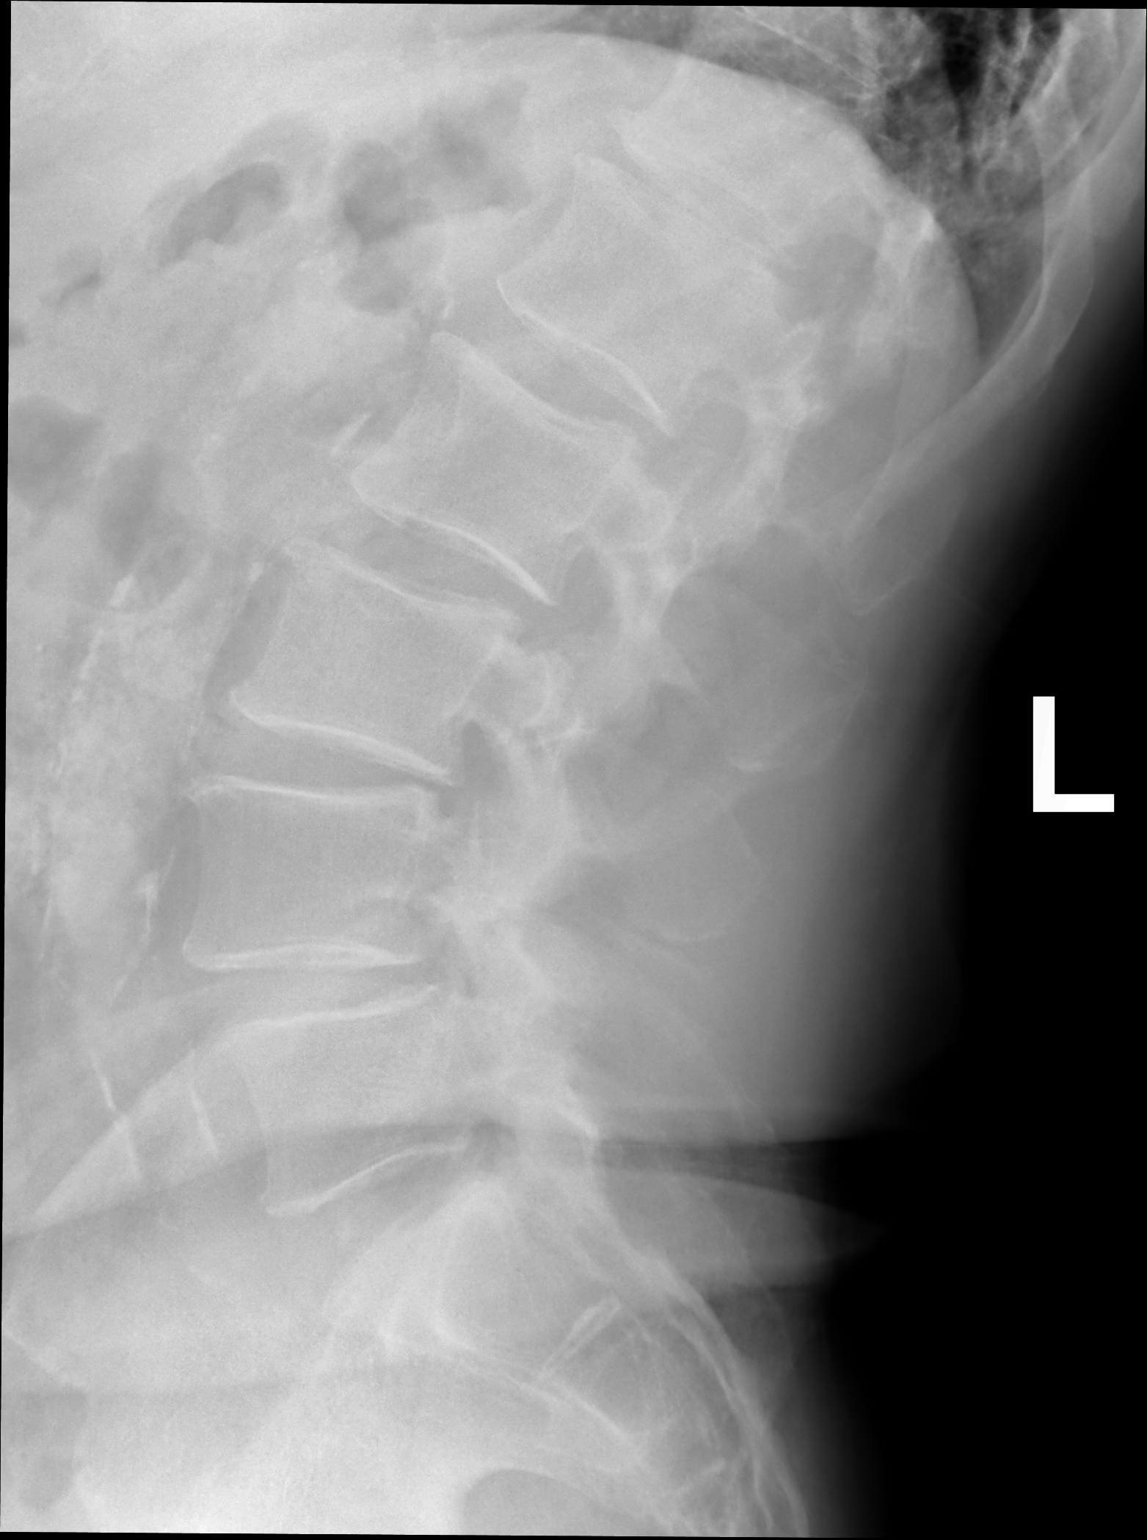

[lumbar spot lat]
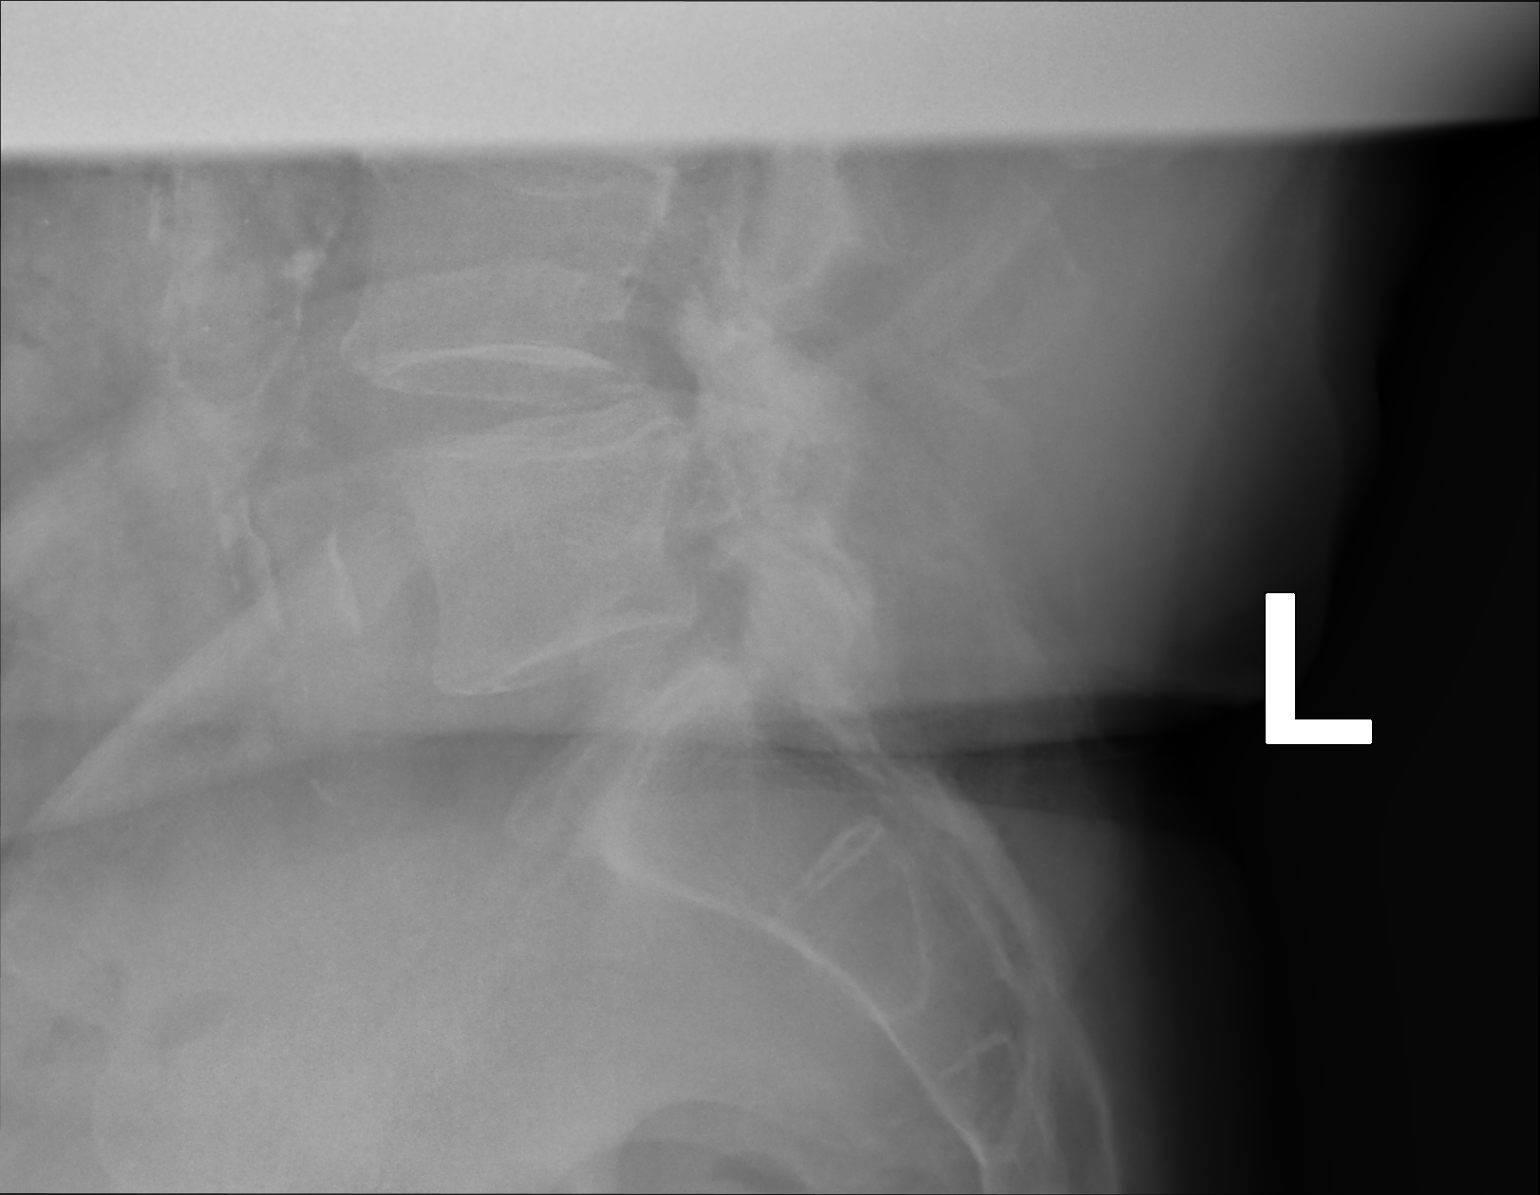

[3 of 3 positions shown; findings below may reference images not displayed]

FINDINGS: Five lumbar type vertebral bodies are well visualized. Vertebral
body height is well maintained. Mild degenerative anterolisthesis of
L4 on L5 is noted. Diffuse aortic calcifications are seen.
IMPRESSION: Mild degenerative change without acute abnormality.

## 2020-05-20 ENCOUNTER — Ambulatory Visit: Payer: Medicare Other | Admitting: Internal Medicine

## 2020-06-20 DIAGNOSIS — Z85828 Personal history of other malignant neoplasm of skin: Secondary | ICD-10-CM | POA: Diagnosis not present

## 2020-06-20 DIAGNOSIS — D225 Melanocytic nevi of trunk: Secondary | ICD-10-CM | POA: Diagnosis not present

## 2020-06-20 DIAGNOSIS — L821 Other seborrheic keratosis: Secondary | ICD-10-CM | POA: Diagnosis not present

## 2020-06-20 DIAGNOSIS — D2262 Melanocytic nevi of left upper limb, including shoulder: Secondary | ICD-10-CM | POA: Diagnosis not present

## 2020-08-05 ENCOUNTER — Encounter: Payer: Self-pay | Admitting: Internal Medicine

## 2020-08-06 NOTE — Telephone Encounter (Signed)
Please call and notify pt that taking aspirin/stopping aspirin is done on an individual basis.  He is overdue a f/u appt with me.  Please have him remain on his aspirin for now and I would like to discuss with him at his next appt.  (Peggy also).

## 2020-08-08 NOTE — Telephone Encounter (Signed)
Pt aware of below. Will continue aspirin. F/u scheduled

## 2020-08-26 ENCOUNTER — Encounter: Payer: Self-pay | Admitting: Internal Medicine

## 2020-08-26 ENCOUNTER — Ambulatory Visit (INDEPENDENT_AMBULATORY_CARE_PROVIDER_SITE_OTHER): Payer: Medicare Other | Admitting: Internal Medicine

## 2020-08-26 ENCOUNTER — Other Ambulatory Visit: Payer: Self-pay

## 2020-08-26 VITALS — BP 114/68 | HR 62 | Temp 98.1°F | Resp 16 | Ht 71.0 in | Wt 177.0 lb

## 2020-08-26 DIAGNOSIS — N138 Other obstructive and reflux uropathy: Secondary | ICD-10-CM

## 2020-08-26 DIAGNOSIS — I723 Aneurysm of iliac artery: Secondary | ICD-10-CM

## 2020-08-26 DIAGNOSIS — I700 Atherosclerosis of aorta: Secondary | ICD-10-CM

## 2020-08-26 DIAGNOSIS — E041 Nontoxic single thyroid nodule: Secondary | ICD-10-CM

## 2020-08-26 DIAGNOSIS — R739 Hyperglycemia, unspecified: Secondary | ICD-10-CM

## 2020-08-26 DIAGNOSIS — R300 Dysuria: Secondary | ICD-10-CM | POA: Diagnosis not present

## 2020-08-26 DIAGNOSIS — E7800 Pure hypercholesterolemia, unspecified: Secondary | ICD-10-CM

## 2020-08-26 DIAGNOSIS — I2510 Atherosclerotic heart disease of native coronary artery without angina pectoris: Secondary | ICD-10-CM

## 2020-08-26 DIAGNOSIS — N401 Enlarged prostate with lower urinary tract symptoms: Secondary | ICD-10-CM

## 2020-08-26 DIAGNOSIS — I10 Essential (primary) hypertension: Secondary | ICD-10-CM

## 2020-08-26 LAB — URINALYSIS, ROUTINE W REFLEX MICROSCOPIC
Bilirubin Urine: NEGATIVE
Hgb urine dipstick: NEGATIVE
Leukocytes,Ua: NEGATIVE
Nitrite: NEGATIVE
Specific Gravity, Urine: 1.02 (ref 1.000–1.030)
Total Protein, Urine: NEGATIVE
Urine Glucose: NEGATIVE
Urobilinogen, UA: 0.2 (ref 0.0–1.0)
pH: 6 (ref 5.0–8.0)

## 2020-08-26 LAB — COMPREHENSIVE METABOLIC PANEL
ALT: 15 U/L (ref 0–53)
AST: 19 U/L (ref 0–37)
Albumin: 4.4 g/dL (ref 3.5–5.2)
Alkaline Phosphatase: 63 U/L (ref 39–117)
BUN: 17 mg/dL (ref 6–23)
CO2: 29 mEq/L (ref 19–32)
Calcium: 9.9 mg/dL (ref 8.4–10.5)
Chloride: 102 mEq/L (ref 96–112)
Creatinine, Ser: 1.08 mg/dL (ref 0.40–1.50)
GFR: 66.92 mL/min (ref >60.00)
Glucose, Bld: 90 mg/dL (ref 70–99)
Potassium: 4.7 mEq/L (ref 3.5–5.1)
Sodium: 139 mEq/L (ref 135–145)
Total Bilirubin: 0.7 mg/dL (ref 0.2–1.2)
Total Protein: 7.1 g/dL (ref 6.0–8.3)

## 2020-08-26 LAB — LIPID PANEL
Cholesterol: 160 mg/dL (ref 0–200)
HDL: 55.9 mg/dL (ref >39.00)
LDL Cholesterol: 92 mg/dL (ref 0–99)
NonHDL: 103.99
Total CHOL/HDL Ratio: 3
Triglycerides: 62 mg/dL (ref 0.0–149.0)
VLDL: 12.4 mg/dL (ref 0.0–40.0)

## 2020-08-26 LAB — HEMOGLOBIN A1C: Hgb A1c MFr Bld: 5.8 % (ref 4.6–6.5)

## 2020-08-26 LAB — T4, FREE: Free T4: 0.86 ng/dL (ref 0.60–1.60)

## 2020-08-26 LAB — TSH: TSH: 1.15 u[IU]/mL (ref 0.35–4.50)

## 2020-08-26 NOTE — Progress Notes (Signed)
Patient ID: Erasto Sleight, male   DOB: 31-Aug-1945, 75 y.o.   MRN: [id number]   Subjective:    Patient ID: [name], male    DOB: [birth year], 75 y.o.   MRN: [id number]  HPI This visit occurred during the SARS-CoV-2 public health emergency.  Safety protocols were in place, including screening questions prior to the visit, additional usage of staff PPE, and extensive cleaning of exam room while observing appropriate contact time as indicated for disinfecting solutions.  Patient here for a scheduled follow up.  Here to follow up regarding his blood pressure and cholesterol.  He is trying to stay active.  No chest pain or sob reported. Walking.  No acid reflux.  No abdominal pain or cramping reported.  Does report that yesterday evening, he noticed some burning with end urination.  Noticed again this am.  Has not noticed since.  No hematuria.  He is emptying his bladder.  No hesitancy or urgency.  States blood pressures averaging 105-110/50-60.     Past Medical History:  Diagnosis Date  . Anemia, unspecified   . BPH (benign prostatic hyperplasia)   . GERD (gastroesophageal reflux disease)   . Hypercholesterolemia   . Hyperglycemia   . Hypertension   . Thrombocytopenia ([place])   . Thyroid nodule    Past Surgical History:  Procedure Laterality Date  . COLONOSCOPY WITH PROPOFOL N/A 10/02/2014   Procedure: COLONOSCOPY WITH PROPOFOL;  Surgeon: Manya Silvas, MD;  Location: [facility] ENDOSCOPY;  Service: Endoscopy;  Laterality: N/A;  . INGUINAL HERNIA REPAIR    . TONSILLECTOMY     Family History  Problem Relation Age of Onset  . Prostate cancer Father   . Heart disease Father        s/p CABG  . Stroke Mother   . Colon cancer Neg Hx    Social History   Socioeconomic History  . Marital status: Married    Spouse name: Not on file  . Number of children: 4  . Years of education: Not on file  . Highest education level: Not on file  Occupational History  . Occupation: retired  Tobacco Use  .  Smoking status: Former Smoker    Packs/day: 1.00    Years: 30.00    Pack years: 30.00    Types: Cigarettes    Quit date: 04/12/1998    Years since quitting: 22.4  . Smokeless tobacco: Never Used  Vaping Use  . Vaping Use: Never used  Substance and Sexual Activity  . Alcohol use: Yes    Alcohol/week: 0.0 standard drinks    Comment: OCC  . Drug use: Never  . Sexual activity: Never  Other Topics Concern  . Not on file  Social History Narrative   He is married. Has four children   Social Determinants of Health   Financial Resource Strain: Low Risk   . Difficulty of Paying Living Expenses: Not hard at all  Food Insecurity: No Food Insecurity  . Worried About Charity fundraiser in the Last Year: Never true  . Ran Out of Food in the Last Year: Never true  Transportation Needs: No Transportation Needs  . Lack of Transportation (Medical): No  . Lack of Transportation (Non-Medical): No  Physical Activity: Sufficiently Active  . Days of Exercise per Week: 7 days  . Minutes of Exercise per Session: 120 min  Stress: No Stress Concern Present  . Feeling of Stress : Not at all  Social Connections: Not on file    Outpatient  Encounter Medications as of 08/26/2020  Medication Sig  . aspirin 81 MG tablet Take 81 mg by mouth daily.  Marland Kitchen atorvastatin (LIPITOR) 10 MG tablet TAKE 1 TABLET BY MOUTH DAILY  . finasteride (PROSCAR) 5 MG tablet Take 1 tablet (5 mg total) by mouth daily.  Marland Kitchen ibuprofen (ADVIL) 600 MG tablet Take 1 tablet (600 mg total) by mouth every 6 (six) hours as needed.  . Lactobacillus (PROBIOTIC ACIDOPHILUS PO) Take by mouth daily.  . metroNIDAZOLE (METROGEL) 0.75 % gel Apply topically 2 (two) times daily. to face  . Multiple Vitamin (MULTI VITAMIN MENS PO) Take 1 tablet by mouth daily.  . Omega-3 Fatty Acids (FISH OIL) 1200 MG CAPS Take 1 capsule by mouth daily.  . Omeprazole Magnesium (PRILOSEC OTC PO) Take by mouth daily.  . pimecrolimus (ELIDEL) 1 % cream APPLY TWICE DAILY  AS NEEDED FOR FLARES  . vitamin B-12 (CYANOCOBALAMIN) 500 MCG tablet Take 500 mcg by mouth daily.  . [DISCONTINUED] methocarbamol (ROBAXIN) 500 MG tablet Take 1 tablet (500 mg total) by mouth 2 (two) times daily. (Patient not taking: Reported on 08/26/2020)   No facility-administered encounter medications on file as of 08/26/2020.    Review of Systems  Constitutional: Negative for appetite change and unexpected weight change.  HENT: Negative for congestion and sinus pressure.   Respiratory: Negative for cough, chest tightness and shortness of breath.   Cardiovascular: Negative for chest pain, palpitations and leg swelling.  Gastrointestinal: Negative for abdominal pain, diarrhea, nausea and vomiting.  Genitourinary: Positive for dysuria. Negative for difficulty urinating and hematuria.  Musculoskeletal: Negative for joint swelling and myalgias.  Skin: Negative for color change and rash.  Neurological: Negative for dizziness, light-headedness and headaches.  Psychiatric/Behavioral: Negative for agitation and dysphoric mood.       Objective:    Physical Exam Vitals reviewed.  Constitutional:      General: He is not in acute distress.    Appearance: Normal appearance. He is well-developed.  HENT:     Head: Normocephalic and atraumatic.     Right Ear: External ear normal.     Left Ear: External ear normal.  Eyes:     General: No scleral icterus.       Right eye: No discharge.        Left eye: No discharge.     Conjunctiva/sclera: Conjunctivae normal.  Cardiovascular:     Rate and Rhythm: Normal rate and regular rhythm.  Pulmonary:     Effort: Pulmonary effort is normal. No respiratory distress.     Breath sounds: Normal breath sounds.  Abdominal:     General: Bowel sounds are normal.     Palpations: Abdomen is soft.     Tenderness: There is no abdominal tenderness.  Musculoskeletal:        General: No swelling or tenderness.     Cervical back: Neck supple. No tenderness.   Skin:    Findings: No erythema or rash.  Neurological:     Mental Status: He is alert.  Psychiatric:        Mood and Affect: Mood normal.        Behavior: Behavior normal.     BP 114/68   Pulse 62   Temp 98.1 F (36.7 C)   Resp 16   Ht '5\' 11"'  (1.803 m)   Wt 177 lb (80.3 kg)   SpO2 98%   BMI 24.69 kg/m  Wt Readings from Last 3 Encounters:  08/26/20 177 lb (80.3 kg)  05/05/20  175 lb (79.4 kg)  04/02/20 175 lb (79.4 kg)     Lab Results  Component Value Date   WBC 8.8 12/14/2019   HGB 14.0 12/14/2019   HCT 42.2 12/14/2019   PLT 184 12/14/2019   GLUCOSE 90 08/26/2020   CHOL 160 08/26/2020   TRIG 62.0 08/26/2020   HDL 55.90 08/26/2020   LDLCALC 92 08/26/2020   ALT 15 08/26/2020   AST 19 08/26/2020   NA 139 08/26/2020   K 4.7 08/26/2020   CL 102 08/26/2020   CREATININE 1.08 08/26/2020   BUN 17 08/26/2020   CO2 29 08/26/2020   TSH 1.15 08/26/2020   PSA 2.60 11/22/2018   HGBA1C 5.8 08/26/2020       Assessment & Plan:   Problem List Items Addressed This Visit    Aortic atherosclerosis ([place])    Continue lipitor.        BPH with obstruction/lower urinary tract symptoms    Recent notice of burning with end urination.  On finasteride.  Check urine to confirm no infection.  Stay hydrated.  Follow symptoms.       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  Stable.  Recommended f/u every other year.        Coronary artery disease    Continue risk factor modification.  Remain on aspirin - low dose.  Stable.       Dysuria   Relevant Orders   Urinalysis, Routine w reflex microscopic (Completed)   Urine Culture (Completed)   Hypercholesterolemia    On lipitor.  Low cholesterol diet and exercise.  Follow lipid panel and liver function tests.        Relevant Orders   Lipid panel (Completed)   Comprehensive metabolic panel (Completed)   Hyperglycemia    Low carb diet and exercise.  Follow met b and a1c.       Relevant Orders    Hemoglobin A1c (Completed)   Hypertension, essential - Primary    Blood pressure is doing well on no medication.  Follow pressures.  Follow metabolic panel.        Thyroid nodule    Worked up previously by Dr Eddie Dibbles.  Previous biopsy negative.  Last check stable.  Recommended f/u prn.  Follow thyroid function tests.       Relevant Orders   TSH (Completed)   T4, free (Completed)       Einar Pheasant, MD

## 2020-08-27 ENCOUNTER — Encounter: Payer: Self-pay | Admitting: Internal Medicine

## 2020-08-27 LAB — URINE CULTURE
MICRO NUMBER:: 11900261
Result:: NO GROWTH
SPECIMEN QUALITY:: ADEQUATE

## 2020-08-31 ENCOUNTER — Encounter: Payer: Self-pay | Admitting: Internal Medicine

## 2020-08-31 NOTE — Assessment & Plan Note (Signed)
Continue risk factor modification.  Remain on aspirin - low dose.  Stable.  

## 2020-08-31 NOTE — Assessment & Plan Note (Signed)
Worked up previously by Dr Paul.  Previous biopsy negative.  Last check stable.  Recommended f/u prn.  Follow thyroid function tests.  

## 2020-08-31 NOTE — Assessment & Plan Note (Signed)
Last evaluated by [facility] 09/2018.  Stable.  Recommended f/u every other year.   

## 2020-08-31 NOTE — Assessment & Plan Note (Signed)
Low carb diet and exercise.  Follow met b and a1c.  

## 2020-08-31 NOTE — Assessment & Plan Note (Signed)
Recent notice of burning with end urination.  On finasteride.  Check urine to confirm no infection.  Stay hydrated.  Follow symptoms.

## 2020-08-31 NOTE — Assessment & Plan Note (Signed)
Blood pressure is doing well on no medication.  Follow pressures.  Follow metabolic panel.   

## 2020-08-31 NOTE — Assessment & Plan Note (Signed)
Continue lipitor  ?

## 2020-08-31 NOTE — Assessment & Plan Note (Signed)
On lipitor.  Low cholesterol diet and exercise.  Follow lipid panel and liver function tests.   

## 2020-09-02 ENCOUNTER — Other Ambulatory Visit: Payer: Self-pay

## 2020-09-02 MED ORDER — ATORVASTATIN CALCIUM 20 MG PO TABS: 20.0000 mg | ORAL_TABLET | Freq: Every day | ORAL | 2 refills | Status: DC

## 2020-09-16 ENCOUNTER — Ambulatory Visit (INDEPENDENT_AMBULATORY_CARE_PROVIDER_SITE_OTHER): Payer: Medicare Other

## 2020-09-16 ENCOUNTER — Ambulatory Visit (INDEPENDENT_AMBULATORY_CARE_PROVIDER_SITE_OTHER): Payer: Medicare Other | Admitting: Vascular Surgery

## 2020-09-16 ENCOUNTER — Encounter (INDEPENDENT_AMBULATORY_CARE_PROVIDER_SITE_OTHER): Payer: Self-pay | Admitting: Vascular Surgery

## 2020-09-16 ENCOUNTER — Other Ambulatory Visit: Payer: Self-pay

## 2020-09-16 VITALS — BP 154/79 | HR 48 | Resp 16 | Wt 181.6 lb

## 2020-09-16 DIAGNOSIS — I10 Essential (primary) hypertension: Secondary | ICD-10-CM | POA: Diagnosis not present

## 2020-09-16 DIAGNOSIS — I723 Aneurysm of iliac artery: Secondary | ICD-10-CM | POA: Diagnosis not present

## 2020-09-16 DIAGNOSIS — E7800 Pure hypercholesterolemia, unspecified: Secondary | ICD-10-CM

## 2020-09-16 NOTE — Progress Notes (Signed)
MRN : [id number]  Nathan Watts is a 75 y.o. ([birth year]) male who presents with chief complaint of  Chief Complaint  Patient presents with  . Follow-up    Ultrasound follow up  .  History of Present Illness: Patient returns today in follow up of his common iliac artery aneurysms.  He is doing well today without complaints.  No aneurysm related symptoms. Specifically, the patient denies new back or abdominal pain, or signs of peripheral embolization. Duplex today shows slight increase in size of his aorta and common iliac arteries.  The aorta now measures 2.9 cm in maximal diameter.  The right common iliac artery measures 1.7 cm in maximal diameter.  The left common iliac artery measures 1.8 cm in maximal diameter  Current Outpatient Medications  Medication Sig Dispense Refill  . aspirin 81 MG tablet Take 81 mg by mouth daily.    Marland Kitchen atorvastatin (LIPITOR) 20 MG tablet Take 1 tablet (20 mg total) by mouth daily. 90 tablet 2  . finasteride (PROSCAR) 5 MG tablet Take 1 tablet (5 mg total) by mouth daily. 90 tablet 3  . ibuprofen (ADVIL) 600 MG tablet Take 1 tablet (600 mg total) by mouth every 6 (six) hours as needed. 30 tablet 0  . Lactobacillus (PROBIOTIC ACIDOPHILUS PO) Take by mouth daily.    . metroNIDAZOLE (METROGEL) 0.75 % gel Apply topically 2 (two) times daily. to face    . Multiple Vitamin (MULTI VITAMIN MENS PO) Take 1 tablet by mouth daily.    . Omega-3 Fatty Acids (FISH OIL) 1200 MG CAPS Take 1 capsule by mouth daily.    . Omeprazole Magnesium (PRILOSEC OTC PO) Take by mouth daily.    . pimecrolimus (ELIDEL) 1 % cream APPLY TWICE DAILY AS NEEDED FOR FLARES    . vitamin B-12 (CYANOCOBALAMIN) 500 MCG tablet Take 500 mcg by mouth daily.     No current facility-administered medications for this visit.    Past Medical History:  Diagnosis Date  . Anemia, unspecified   . BPH (benign prostatic hyperplasia)   . GERD (gastroesophageal reflux disease)   . Hypercholesterolemia   .  Hyperglycemia   . Hypertension   . Thrombocytopenia ([place])   . Thyroid nodule     Past Surgical History:  Procedure Laterality Date  . COLONOSCOPY WITH PROPOFOL N/A 10/02/2014   Procedure: COLONOSCOPY WITH PROPOFOL;  Surgeon: Manya Silvas, MD;  Location: [facility] ENDOSCOPY;  Service: Endoscopy;  Laterality: N/A;  . INGUINAL HERNIA REPAIR    . TONSILLECTOMY       Social History   Tobacco Use  . Smoking status: Former Smoker    Packs/day: 1.00    Years: 30.00    Pack years: 30.00    Types: Cigarettes    Quit date: 04/12/1998    Years since quitting: 22.4  . Smokeless tobacco: Never Used  Vaping Use  . Vaping Use: Never used  Substance Use Topics  . Alcohol use: Yes    Alcohol/week: 0.0 standard drinks    Comment: OCC  . Drug use: Never       Family History  Problem Relation Age of Onset  . Prostate cancer Father   . Heart disease Father        s/p CABG  . Stroke Mother   . Colon cancer Neg Hx      Allergies  Allergen Reactions  . Hydrocodone-Chlorpheniramine Other (See Comments)    Unknown, possible causes anxiety  possible causes anxiety Unknown, possible causes anxiety  .  Crestor [Rosuvastatin] Rash    Causes GI upset and anxiety     REVIEW OF SYSTEMS (Negative unless checked)  Constitutional: [] Weight loss  [] Fever  [] Chills Cardiac: [] Chest pain   [] Chest pressure   [] Palpitations   [] Shortness of breath when laying flat   [] Shortness of breath at rest   [] Shortness of breath with exertion. Vascular:  [] Pain in legs with walking   [] Pain in legs at rest   [] Pain in legs when laying flat   [] Claudication   [] Pain in feet when walking  [] Pain in feet at rest  [] Pain in feet when laying flat   [] History of DVT   [] Phlebitis   [] Swelling in legs   [] Varicose veins   [] Non-healing ulcers Pulmonary:   [] Uses home oxygen   [] Productive cough   [] Hemoptysis   [] Wheeze  [] COPD   [] Asthma Neurologic:  [] Dizziness  [] Blackouts   [] Seizures   [] History of stroke    [] History of TIA  [] Aphasia   [] Temporary blindness   [] Dysphagia   [] Weakness or numbness in arms   [] Weakness or numbness in legs Musculoskeletal:  [] Arthritis   [] Joint swelling   [] Joint pain   [] Low back pain Hematologic:  [] Easy bruising  [] Easy bleeding   [] Hypercoagulable state   [x] Anemic   Gastrointestinal:  [] Blood in stool   [] Vomiting blood  [x] Gastroesophageal reflux/heartburn   [] Abdominal pain Genitourinary:  [] Chronic kidney disease   [] Difficult urination  [] Frequent urination  [] Burning with urination   [] Hematuria Skin:  [] Rashes   [] Ulcers   [] Wounds Psychological:  [] History of anxiety   []  History of major depression.  Physical Examination  BP (!) 154/79 (BP Location: Right Arm)   Pulse (!) 48   Resp 16   Wt 181 lb 9.6 oz (82.4 kg)   BMI 25.33 kg/m  Gen:  WD/WN, NAD.  Appears younger than stated age Head: [place]/AT, No temporalis wasting. Ear/Nose/Throat: Hearing grossly intact, nares w/o erythema or drainage Eyes: Conjunctiva clear. Sclera non-icteric Neck: Supple.  Trachea midline Pulmonary:  Good air movement, no use of accessory muscles.  Cardiac: RRR, no JVD Vascular:  Vessel Right Left  Radial Palpable Palpable                          PT Palpable Palpable  DP Palpable Palpable   Gastrointestinal: soft, non-tender/non-distended. No guarding/reflex.  Musculoskeletal: M/S 5/5 throughout.  No deformity or atrophy.  No edema. Neurologic: Sensation grossly intact in extremities.  Symmetrical.  Speech is fluent.  Psychiatric: Judgment intact, Mood & affect appropriate for pt's clinical situation. Dermatologic: No rashes or ulcers noted.  No cellulitis or open wounds.       Labs Recent Results (from the past 2160 hour(s))  TSH     Status: None   Collection Time: 08/26/20 10:21 AM  Result Value Ref Range   TSH 1.15 0.35 - 4.50 uIU/mL  T4, free     Status: None   Collection Time: 08/26/20 10:21 AM  Result Value Ref Range   Free T4 0.86 0.60 -  1.60 ng/dL    Comment: Specimens from patients who are undergoing biotin therapy and /or ingesting biotin supplements may contain high levels of biotin.  The higher biotin concentration in these specimens interferes with this Free T4 assay.  Specimens that contain high levels  of biotin may cause false high results for this Free T4 assay.  Please interpret results in light of the total clinical presentation of  the patient.    Lipid panel     Status: None   Collection Time: 08/26/20 10:21 AM  Result Value Ref Range   Cholesterol 160 0 - 200 mg/dL    Comment: ATP III Classification       Desirable:  < 200 mg/dL               Borderline High:  200 - 239 mg/dL          High:  > = 240 mg/dL   Triglycerides 62.0 0.0 - 149.0 mg/dL    Comment: Normal:  <150 mg/dLBorderline High:  150 - 199 mg/dL   HDL 55.90 >39.00 mg/dL   VLDL 12.4 0.0 - 40.0 mg/dL   LDL Cholesterol 92 0 - 99 mg/dL   Total CHOL/HDL Ratio 3     Comment:                Men          Women1/2 Average Risk     3.4          3.3Average Risk          5.0          4.42X Average Risk          9.6          7.13X Average Risk          15.0          11.0                       NonHDL 103.99     Comment: NOTE:  Non-HDL goal should be 30 mg/dL higher than patient's LDL goal (i.e. LDL goal of < 70 mg/dL, would have non-HDL goal of < 100 mg/dL)  Comprehensive metabolic panel     Status: None   Collection Time: 08/26/20 10:21 AM  Result Value Ref Range   Sodium 139 135 - 145 mEq/L   Potassium 4.7 3.5 - 5.1 mEq/L   Chloride 102 96 - 112 mEq/L   CO2 29 19 - 32 mEq/L   Glucose, Bld 90 70 - 99 mg/dL   BUN 17 6 - 23 mg/dL   Creatinine, Ser 1.08 0.40 - 1.50 mg/dL   Total Bilirubin 0.7 0.2 - 1.2 mg/dL   Alkaline Phosphatase 63 39 - 117 U/L   AST 19 0 - 37 U/L   ALT 15 0 - 53 U/L   Total Protein 7.1 6.0 - 8.3 g/dL   Albumin 4.4 3.5 - 5.2 g/dL   GFR 66.92 >60.00 mL/min    Comment: Calculated using the CKD-EPI Creatinine Equation (2021)   Calcium 9.9  8.4 - 10.5 mg/dL  Hemoglobin A1c     Status: None   Collection Time: 08/26/20 10:21 AM  Result Value Ref Range   Hgb A1c MFr Bld 5.8 4.6 - 6.5 %    Comment: Glycemic Control Guidelines for People with Diabetes:Non Diabetic:  <6%Goal of Therapy: <7%Additional Action Suggested:  >8%   Urinalysis, Routine w reflex microscopic     Status: Abnormal   Collection Time: 08/26/20 10:21 AM  Result Value Ref Range   Color, Urine YELLOW Yellow;Lt. Yellow;Straw;Dark Yellow;Amber;Green;Red;Brown   APPearance CLEAR Clear;Turbid;Slightly Cloudy;Cloudy   Specific Gravity, Urine 1.020 1.000 - 1.030   pH 6.0 5.0 - 8.0   Total Protein, Urine NEGATIVE Negative   Urine Glucose NEGATIVE Negative   Ketones, ur TRACE (A) Negative   Bilirubin Urine NEGATIVE Negative  Hgb urine dipstick NEGATIVE Negative   Urobilinogen, UA 0.2 0.0 - 1.0   Leukocytes,Ua NEGATIVE Negative   Nitrite NEGATIVE Negative   WBC, UA 0-2/hpf 0-2/hpf   RBC / HPF 0-2/hpf 0-2/hpf   Squamous Epithelial / LPF Rare(0-4/hpf) Rare(0-4/hpf)  Urine Culture     Status: None   Collection Time: 08/26/20 10:21 AM   Specimen: Urine  Result Value Ref Range   MICRO NUMBER: [id number]    SPECIMEN QUALITY: Adequate    Sample Source NOT GIVEN    STATUS: FINAL    Result: No Growth     Radiology No results found.  Assessment/Plan Hypertension, essential blood pressure control important in reducing the progression of atherosclerotic disease and aneurysmal disease   Hypercholesterolemia lipid control important in reducing the progression of atherosclerotic disease. Continue statin therapy  Common iliac aneurysm ([facility]) Duplex today shows slight increase in size of his aorta and common iliac arteries.  The aorta now measures 2.9 cm in maximal diameter.  The right common iliac artery measures 1.7 cm in maximal diameter.  The left common iliac artery measures 1.8 cm in maximal diameter.  It has been 2 years since his last visit with slight growth.   We will continue every other year follow-up unless his aorta reaches greater than 3 cm or his iliac arteries reached 2 cm.    Leotis Pain, MD  09/16/2020 8:58 AM    This note was created with Dragon medical transcription system.  Any errors from dictation are purely unintentional

## 2020-09-16 NOTE — Assessment & Plan Note (Signed)
Duplex today shows slight increase in size of his aorta and common iliac arteries.  The aorta now measures 2.9 cm in maximal diameter.  The right common iliac artery measures 1.7 cm in maximal diameter.  The left common iliac artery measures 1.8 cm in maximal diameter.  It has been 2 years since his last visit with slight growth.  We will continue every other year follow-up unless his aorta reaches greater than 3 cm or his iliac arteries reached 2 cm.

## 2020-12-19 ENCOUNTER — Ambulatory Visit: Payer: Medicare Other

## 2021-01-26 ENCOUNTER — Telehealth: Payer: Self-pay | Admitting: Internal Medicine

## 2021-01-26 NOTE — Telephone Encounter (Signed)
Copied from [place] [id number]. Topic: Medicare AWV >> Jan 26, 2021 10:02 AM Harris-Coley, Hannah Beat wrote: Reason for CRM: LVM 01/26/21 to r/s AWV 02/03/21 appt [facility]

## 2021-02-04 ENCOUNTER — Ambulatory Visit: Payer: Medicare Other

## 2021-02-12 ENCOUNTER — Other Ambulatory Visit: Payer: Self-pay | Admitting: Urology

## 2021-03-02 ENCOUNTER — Ambulatory Visit (INDEPENDENT_AMBULATORY_CARE_PROVIDER_SITE_OTHER): Payer: Medicare Other | Admitting: Internal Medicine

## 2021-03-02 ENCOUNTER — Encounter: Payer: Self-pay | Admitting: Internal Medicine

## 2021-03-02 ENCOUNTER — Other Ambulatory Visit: Payer: Self-pay

## 2021-03-02 VITALS — BP 120/70 | HR 54 | Temp 97.9°F | Resp 16 | Ht 71.0 in | Wt 177.6 lb

## 2021-03-02 DIAGNOSIS — Z125 Encounter for screening for malignant neoplasm of prostate: Secondary | ICD-10-CM

## 2021-03-02 DIAGNOSIS — I723 Aneurysm of iliac artery: Secondary | ICD-10-CM

## 2021-03-02 DIAGNOSIS — R739 Hyperglycemia, unspecified: Secondary | ICD-10-CM | POA: Diagnosis not present

## 2021-03-02 DIAGNOSIS — E041 Nontoxic single thyroid nodule: Secondary | ICD-10-CM

## 2021-03-02 DIAGNOSIS — Z0000 Encounter for general adult medical examination without abnormal findings: Secondary | ICD-10-CM | POA: Diagnosis not present

## 2021-03-02 DIAGNOSIS — I10 Essential (primary) hypertension: Secondary | ICD-10-CM

## 2021-03-02 DIAGNOSIS — R972 Elevated prostate specific antigen [PSA]: Secondary | ICD-10-CM

## 2021-03-02 DIAGNOSIS — N138 Other obstructive and reflux uropathy: Secondary | ICD-10-CM

## 2021-03-02 DIAGNOSIS — I700 Atherosclerosis of aorta: Secondary | ICD-10-CM

## 2021-03-02 DIAGNOSIS — E7800 Pure hypercholesterolemia, unspecified: Secondary | ICD-10-CM

## 2021-03-02 DIAGNOSIS — N401 Enlarged prostate with lower urinary tract symptoms: Secondary | ICD-10-CM

## 2021-03-02 DIAGNOSIS — Z23 Encounter for immunization: Secondary | ICD-10-CM

## 2021-03-02 DIAGNOSIS — Z1211 Encounter for screening for malignant neoplasm of colon: Secondary | ICD-10-CM

## 2021-03-02 DIAGNOSIS — I2510 Atherosclerotic heart disease of native coronary artery without angina pectoris: Secondary | ICD-10-CM

## 2021-03-02 LAB — HEMOGLOBIN A1C: Hgb A1c MFr Bld: 5.7 % (ref 4.6–6.5)

## 2021-03-02 LAB — CBC WITH DIFFERENTIAL/PLATELET
Basophils Absolute: 0 10*3/uL (ref 0.0–0.1)
Basophils Relative: 0.6 % (ref 0.0–3.0)
Eosinophils Absolute: 0.1 10*3/uL (ref 0.0–0.7)
Eosinophils Relative: 2.3 % (ref 0.0–5.0)
HCT: 41.6 % (ref 39.0–52.0)
Hemoglobin: 13.7 g/dL (ref 13.0–17.0)
Lymphocytes Relative: 21.8 % (ref 12.0–46.0)
Lymphs Abs: 1.3 10*3/uL (ref 0.7–4.0)
MCHC: 33.1 g/dL (ref 30.0–36.0)
MCV: 90.4 fl (ref 78.0–100.0)
Monocytes Absolute: 0.6 10*3/uL (ref 0.1–1.0)
Monocytes Relative: 9.9 % (ref 3.0–12.0)
Neutro Abs: 4 10*3/uL (ref 1.4–7.7)
Neutrophils Relative %: 65.4 % (ref 43.0–77.0)
Platelets: 166 10*3/uL (ref 150.0–400.0)
RBC: 4.6 Mil/uL (ref 4.22–5.81)
RDW: 14.1 % (ref 11.5–15.5)
WBC: 6.1 10*3/uL (ref 4.0–10.5)

## 2021-03-02 LAB — BASIC METABOLIC PANEL
BUN: 17 mg/dL (ref 6–23)
CO2: 31 mEq/L (ref 19–32)
Calcium: 9.2 mg/dL (ref 8.4–10.5)
Chloride: 102 mEq/L (ref 96–112)
Creatinine, Ser: 1.02 mg/dL (ref 0.40–1.50)
GFR: 71.41 mL/min (ref >60.00)
Glucose, Bld: 99 mg/dL (ref 70–99)
Potassium: 4.4 mEq/L (ref 3.5–5.1)
Sodium: 138 mEq/L (ref 135–145)

## 2021-03-02 LAB — HEPATIC FUNCTION PANEL
ALT: 17 U/L (ref 0–53)
AST: 19 U/L (ref 0–37)
Albumin: 4.3 g/dL (ref 3.5–5.2)
Alkaline Phosphatase: 50 U/L (ref 39–117)
Bilirubin, Direct: 0.2 mg/dL (ref 0.0–0.3)
Total Bilirubin: 0.8 mg/dL (ref 0.2–1.2)
Total Protein: 7 g/dL (ref 6.0–8.3)

## 2021-03-02 LAB — LIPID PANEL
Cholesterol: 151 mg/dL (ref 0–200)
HDL: 55.3 mg/dL (ref >39.00)
LDL Cholesterol: 80 mg/dL (ref 0–99)
NonHDL: 95.23
Total CHOL/HDL Ratio: 3
Triglycerides: 74 mg/dL (ref 0.0–149.0)
VLDL: 14.8 mg/dL (ref 0.0–40.0)

## 2021-03-02 LAB — PSA, MEDICARE: PSA: 1.63 ng/ml (ref 0.10–4.00)

## 2021-03-02 LAB — TSH: TSH: 1.25 u[IU]/mL (ref 0.35–5.50)

## 2021-03-02 LAB — T4, FREE: Free T4: 0.8 ng/dL (ref 0.60–1.60)

## 2021-03-02 NOTE — Progress Notes (Signed)
Patient ID: Nathan Watts, male   DOB: 19-Nov-1945, 75 y.o.   MRN: [id number]   Subjective:    Patient ID: Nathan Watts, male    DOB: October 13, 1945, 75 y.o.   MRN: [id number]  This visit occurred during the SARS-CoV-2 public health emergency.  Safety protocols were in place, including screening questions prior to the visit, additional usage of staff PPE, and extensive cleaning of exam room while observing appropriate contact time as indicated for disinfecting solutions.   Patient here for his physical exam.   Chief Complaint  Patient presents with   Annual Exam   .   HPI He is doing well.  Stays active.  Exercises.  No chest pain or sob reported.  No abdominal pain or bowel change reported.  Reports blood pressures have been doing well on outside checks.  Increased stress - with wife's medical issues.  He reports he is handling this well.     Past Medical History:  Diagnosis Date   Anemia, unspecified    BPH (benign prostatic hyperplasia)    GERD (gastroesophageal reflux disease)    Hypercholesterolemia    Hyperglycemia    Hypertension    Thrombocytopenia (HCC)    Thyroid nodule    Past Surgical History:  Procedure Laterality Date   COLONOSCOPY WITH PROPOFOL N/A 10/02/2014   Procedure: COLONOSCOPY WITH PROPOFOL;  Surgeon: Manya Silvas, MD;  Location: [facility] ENDOSCOPY;  Service: Endoscopy;  Laterality: N/A;   INGUINAL HERNIA REPAIR     TONSILLECTOMY     Family History  Problem Relation Age of Onset   Prostate cancer Father    Heart disease Father        s/p CABG   Stroke Mother    Colon cancer Neg Hx    Social History   Socioeconomic History   Marital status: Married    Spouse name: Not on file   Number of children: 4   Years of education: Not on file   Highest education level: Not on file  Occupational History   Occupation: retired  Tobacco Use   Smoking status: Former    Packs/day: 1.00    Years: 30.00    Pack years: 30.00    Types: Cigarettes    Quit date:  04/12/1998    Years since quitting: 22.9   Smokeless tobacco: Never  Vaping Use   Vaping Use: Never used  Substance and Sexual Activity   Alcohol use: Yes    Alcohol/week: 0.0 standard drinks    Comment: OCC   Drug use: Never   Sexual activity: Never  Other Topics Concern   Not on file  Social History Narrative   He is married. Has four children   Social Determinants of Health   Financial Resource Strain: Not on file  Food Insecurity: Not on file  Transportation Needs: Not on file  Physical Activity: Not on file  Stress: Not on file  Social Connections: Not on file     Review of Systems  Constitutional:  Negative for appetite change and unexpected weight change.  HENT:  Negative for congestion, sinus pressure and sore throat.   Eyes:  Negative for pain and visual disturbance.  Respiratory:  Negative for cough, chest tightness and shortness of breath.   Cardiovascular:  Negative for chest pain, palpitations and leg swelling.  Gastrointestinal:  Negative for abdominal pain, diarrhea, nausea and vomiting.  Genitourinary:  Negative for difficulty urinating and dysuria.  Musculoskeletal:  Negative for joint swelling and myalgias.  Skin:  Negative  for color change and rash.  Neurological:  Negative for dizziness, light-headedness and headaches.  Hematological:  Negative for adenopathy. Does not bruise/bleed easily.  Psychiatric/Behavioral:  Negative for agitation and dysphoric mood.       Objective:     BP 120/70   Pulse (!) 54   Temp 97.9 F (36.6 C)   Resp 16   Ht '5\' 11"'  (1.803 m)   Wt 177 lb 9.6 oz (80.6 kg)   SpO2 99%   BMI 24.77 kg/m  Wt Readings from Last 3 Encounters:  03/02/21 177 lb 9.6 oz (80.6 kg)  09/16/20 181 lb 9.6 oz (82.4 kg)  08/26/20 177 lb (80.3 kg)    Physical Exam Constitutional:      General: He is not in acute distress.    Appearance: Normal appearance. He is well-developed.  HENT:     Head: Normocephalic and atraumatic.     Right Ear:  External ear normal.     Left Ear: External ear normal.  Eyes:     General: No scleral icterus.       Right eye: No discharge.        Left eye: No discharge.     Conjunctiva/sclera: Conjunctivae normal.  Neck:     Thyroid: No thyromegaly.  Cardiovascular:     Rate and Rhythm: Normal rate and regular rhythm.  Pulmonary:     Effort: No respiratory distress.     Breath sounds: Normal breath sounds. No wheezing.  Abdominal:     General: Bowel sounds are normal.     Palpations: Abdomen is soft.     Tenderness: There is no abdominal tenderness.  Musculoskeletal:        General: No swelling or tenderness.     Cervical back: Neck supple. No tenderness.  Lymphadenopathy:     Cervical: No cervical adenopathy.  Skin:    Findings: No erythema or rash.  Neurological:     Mental Status: He is alert and oriented to person, place, and time.  Psychiatric:        Mood and Affect: Mood normal.        Behavior: Behavior normal.     Outpatient Encounter Medications as of 03/02/2021  Medication Sig   aspirin 81 MG tablet Take 81 mg by mouth daily.   atorvastatin (LIPITOR) 20 MG tablet Take 1 tablet (20 mg total) by mouth daily.   finasteride (PROSCAR) 5 MG tablet TAKE 1 TABLET BY MOUTH DAILY   Lactobacillus (PROBIOTIC ACIDOPHILUS PO) Take by mouth daily.   metroNIDAZOLE (METROGEL) 0.75 % gel Apply topically 2 (two) times daily. to face   Multiple Vitamin (MULTI VITAMIN MENS PO) Take 1 tablet by mouth daily.   Omega-3 Fatty Acids (FISH OIL) 1200 MG CAPS Take 1 capsule by mouth daily.   Omeprazole Magnesium (PRILOSEC OTC PO) Take by mouth daily.   pimecrolimus (ELIDEL) 1 % cream APPLY TWICE DAILY AS NEEDED FOR FLARES   vitamin B-12 (CYANOCOBALAMIN) 500 MCG tablet Take 500 mcg by mouth daily.   [DISCONTINUED] finasteride (PROSCAR) 5 MG tablet Take 1 tablet (5 mg total) by mouth daily.   [DISCONTINUED] ibuprofen (ADVIL) 600 MG tablet Take 1 tablet (600 mg total) by mouth every 6 (six) hours as  needed.   No facility-administered encounter medications on file as of 03/02/2021.     Lab Results  Component Value Date   WBC 8.8 12/14/2019   HGB 14.0 12/14/2019   HCT 42.2 12/14/2019   PLT 184 12/14/2019  GLUCOSE 90 08/26/2020   CHOL 160 08/26/2020   TRIG 62.0 08/26/2020   HDL 55.90 08/26/2020   LDLCALC 92 08/26/2020   ALT 15 08/26/2020   AST 19 08/26/2020   NA 139 08/26/2020   K 4.7 08/26/2020   CL 102 08/26/2020   CREATININE 1.08 08/26/2020   BUN 17 08/26/2020   CO2 29 08/26/2020   TSH 1.15 08/26/2020   PSA 2.60 11/22/2018   HGBA1C 5.8 08/26/2020       Assessment & Plan:   Problem List Items Addressed This Visit     Aortic atherosclerosis ([place])    Continue lipitor.        BPH with obstruction/lower urinary tract symptoms    Stable.  On finasteride.       Common iliac aneurysm (HCC)    Sees Dr Lucky Cowboy.  Last evaluated 09/16/2020.  Slight growth (2.9cm) in maximal diameter (aorta).  Right common iliac artery 1.7cm.  Left 1.8cm.  Recommended f/u every other year.        Coronary artery disease    Continue risk factor modification.  Remain on aspirin - low dose.  Stable.       Elevated PSA    Was followed by Dr Bernardo Heater.  Check psa today.       Health care maintenance    Physical today 03/02/21.  Colonoscopy 09/2014 - internal hemorrhoids otherwise normal.  Recommended f/u in 10 years.  IFOB.  Check psa today.        Hypercholesterolemia    On lipitor.  Low cholesterol diet and exercise.  Follow lipid panel and liver function tests.        Relevant Orders   Hepatic function panel   Lipid panel   Hyperglycemia    Low carb diet and exercise.  Follow met b and a1c.       Relevant Orders   Hemoglobin A1c   Hypertension, essential    Blood pressure is doing well on no medication.  Follow pressures.  Follow metabolic panel.        Relevant Orders   CBC with Differential/Platelet   Basic metabolic panel   Thyroid nodule    Worked up previously by Dr  Eddie Dibbles.  Previous biopsy negative.  Last check stable.  Recommended f/u prn.  Follow thyroid function tests.       Relevant Orders   TSH   T4, free   Other Visit Diagnoses     Routine general medical examination at a health care facility    -  Primary   Colon cancer screening       Relevant Orders   Fecal occult blood, imunochemical([facility])   Prostate cancer screening       Relevant Orders   PSA, Medicare        Einar Pheasant, MD

## 2021-03-02 NOTE — Assessment & Plan Note (Signed)
Continue lipitor  ?

## 2021-03-02 NOTE — Assessment & Plan Note (Signed)
Was followed by Dr Bernardo Heater.  Check psa today.

## 2021-03-02 NOTE — Assessment & Plan Note (Signed)
Low carb diet and exercise.  Follow met b and a1c.  

## 2021-03-02 NOTE — Assessment & Plan Note (Signed)
Worked up previously by Dr Paul.  Previous biopsy negative.  Last check stable.  Recommended f/u prn.  Follow thyroid function tests.  

## 2021-03-02 NOTE — Assessment & Plan Note (Signed)
Stable.  On finasteride.

## 2021-03-02 NOTE — Assessment & Plan Note (Signed)
Continue risk factor modification.  Remain on aspirin - low dose.  Stable.  

## 2021-03-02 NOTE — Assessment & Plan Note (Signed)
On lipitor.  Low cholesterol diet and exercise.  Follow lipid panel and liver function tests.   

## 2021-03-02 NOTE — Assessment & Plan Note (Signed)
Sees Dr Dew.  Last evaluated 09/16/2020.  Slight growth (2.9cm) in maximal diameter (aorta).  Right common iliac artery 1.7cm.  Left 1.8cm.  Recommended f/u every other year.   

## 2021-03-02 NOTE — Assessment & Plan Note (Signed)
Physical today 03/02/21.  Colonoscopy 09/2014 - internal hemorrhoids otherwise normal.  Recommended f/u in 10 years.  IFOB.  Check psa today.

## 2021-03-02 NOTE — Assessment & Plan Note (Signed)
Blood pressure is doing well on no medication.  Follow pressures.  Follow metabolic panel.   

## 2021-03-03 ENCOUNTER — Other Ambulatory Visit (INDEPENDENT_AMBULATORY_CARE_PROVIDER_SITE_OTHER): Payer: Medicare Other

## 2021-03-03 DIAGNOSIS — Z1211 Encounter for screening for malignant neoplasm of colon: Secondary | ICD-10-CM | POA: Diagnosis not present

## 2021-03-04 LAB — FECAL OCCULT BLOOD, IMMUNOCHEMICAL: Fecal Occult Bld: POSITIVE — AB

## 2021-03-06 ENCOUNTER — Encounter: Payer: Self-pay | Admitting: Internal Medicine

## 2021-03-07 ENCOUNTER — Other Ambulatory Visit: Payer: Self-pay | Admitting: Internal Medicine

## 2021-03-07 DIAGNOSIS — R195 Other fecal abnormalities: Secondary | ICD-10-CM

## 2021-03-07 NOTE — Progress Notes (Signed)
Order placed for GI referral.   

## 2021-03-09 ENCOUNTER — Encounter: Payer: Self-pay | Admitting: Internal Medicine

## 2021-03-18 ENCOUNTER — Other Ambulatory Visit: Payer: Self-pay

## 2021-03-18 ENCOUNTER — Other Ambulatory Visit: Payer: Medicare Other

## 2021-03-18 ENCOUNTER — Ambulatory Visit: Payer: Medicare Other | Admitting: Urology

## 2021-03-18 DIAGNOSIS — N138 Other obstructive and reflux uropathy: Secondary | ICD-10-CM | POA: Diagnosis not present

## 2021-03-18 DIAGNOSIS — N401 Enlarged prostate with lower urinary tract symptoms: Secondary | ICD-10-CM

## 2021-03-19 ENCOUNTER — Telehealth: Payer: Self-pay | Admitting: Internal Medicine

## 2021-03-19 ENCOUNTER — Telehealth (INDEPENDENT_AMBULATORY_CARE_PROVIDER_SITE_OTHER): Payer: Medicare Other | Admitting: Internal Medicine

## 2021-03-19 DIAGNOSIS — R739 Hyperglycemia, unspecified: Secondary | ICD-10-CM | POA: Diagnosis not present

## 2021-03-19 DIAGNOSIS — U071 COVID-19: Secondary | ICD-10-CM

## 2021-03-19 LAB — MICROSCOPIC EXAMINATION
Bacteria, UA: NONE SEEN
RBC, Urine: NONE SEEN /hpf (ref 0–2)

## 2021-03-19 LAB — URINALYSIS, COMPLETE
Bilirubin, UA: NEGATIVE
Glucose, UA: NEGATIVE
Ketones, UA: NEGATIVE
Leukocytes,UA: NEGATIVE
Nitrite, UA: NEGATIVE
Protein,UA: NEGATIVE
Specific Gravity, UA: 1.01 (ref 1.005–1.030)
Urobilinogen, Ur: 0.2 mg/dL (ref 0.2–1.0)
pH, UA: 5.5 (ref 5.0–7.5)

## 2021-03-19 MED ORDER — PREDNISONE 10 MG PO TABS: ORAL_TABLET | ORAL | 0 refills | Status: DC

## 2021-03-19 MED ORDER — MOLNUPIRAVIR EUA 200MG CAPSULE: 4.0000 | ORAL_CAPSULE | Freq: Two times a day (BID) | ORAL | 0 refills | Status: AC

## 2021-03-19 NOTE — Progress Notes (Signed)
Patient ID: Nathan Watts, male   DOB: [birth year], 75 y.o.   MRN: [id number]   Virtual Visit via vieeo Note  This visit type was conducted due to national recommendations for restrictions regarding the COVID-19 pandemic (e.g. social distancing).  This format is felt to be most appropriate for this patient at this time.  All issues noted in this document were discussed and addressed.  No physical exam was performed (except for noted visual exam findings with Video Visits).   I connected with [name]  by a video enabled telemedicine application and verified that I am speaking with the correct person using two identifiers. Location patient: home Location provider: work Persons participating in the virtual visit: patient, provider  The limitations, risks, security and privacy concerns of performing an evaluation and management service by video and the availability of in person appointments have been discussed. It has also been discussed with the patient that there may be a patient responsible charge related to this service. The patient expressed understanding and agreed to proceed.   Reason for visit: work in appt  HPI: Work in - covid positive.  Reports symptoms started 03/17/21 - headache.  Increased congestion and dry hacking cough.  Chills.  Tmax 99.2.  increased cough.  Coughing fits.  Some chest tightness - 03/17/21 pm.  No sob.  No chest pain.  Felt some better through day 03/18/21, but symptoms increased 03/18/21 pm.  No headache now.  No sinus pressure.  Runny nose.  No sore throat.  No nausea or vomiting.  No diarrhea.  Still with increased cough.     ROS: See pertinent positives and negatives per HPI.  Past Medical History:  Diagnosis Date   Anemia, unspecified    BPH (benign prostatic hyperplasia)    GERD (gastroesophageal reflux disease)    Hypercholesterolemia    Hyperglycemia    Hypertension    Thrombocytopenia (HCC)    Thyroid nodule     Past Surgical History:  Procedure  Laterality Date   COLONOSCOPY WITH PROPOFOL N/A 10/02/2014   Procedure: COLONOSCOPY WITH PROPOFOL;  Surgeon: Manya Silvas, MD;  Location: [facility] ENDOSCOPY;  Service: Endoscopy;  Laterality: N/A;   INGUINAL HERNIA REPAIR     TONSILLECTOMY      Family History  Problem Relation Age of Onset   Prostate cancer Father    Heart disease Father        s/p CABG   Stroke Mother    Colon cancer Neg Hx     SOCIAL HX: reviewed.    Current Outpatient Medications:    aspirin 81 MG tablet, Take 81 mg by mouth daily., Disp: , Rfl:    atorvastatin (LIPITOR) 20 MG tablet, Take 1 tablet (20 mg total) by mouth daily., Disp: 90 tablet, Rfl: 2   finasteride (PROSCAR) 5 MG tablet, TAKE 1 TABLET BY MOUTH DAILY, Disp: 90 tablet, Rfl: 3   Lactobacillus (PROBIOTIC ACIDOPHILUS PO), Take by mouth daily., Disp: , Rfl:    metroNIDAZOLE (METROGEL) 0.75 % gel, Apply topically 2 (two) times daily. to face, Disp: , Rfl:    molnupiravir EUA (LAGEVRIO) 200 mg CAPS capsule, Take 4 capsules (800 mg total) by mouth 2 (two) times daily for 5 days., Disp: 40 capsule, Rfl: 0   Multiple Vitamin (MULTI VITAMIN MENS PO), Take 1 tablet by mouth daily., Disp: , Rfl:    Omega-3 Fatty Acids (FISH OIL) 1200 MG CAPS, Take 1 capsule by mouth daily., Disp: , Rfl:    Omeprazole Magnesium (PRILOSEC  OTC PO), Take by mouth daily., Disp: , Rfl:    pimecrolimus (ELIDEL) 1 % cream, APPLY TWICE DAILY AS NEEDED FOR FLARES, Disp: , Rfl:    predniSONE (DELTASONE) 10 MG tablet, Take 4 tablets x 1 day and then decrease by 1/2 tablet per day until down to zero mg., Disp: 18 tablet, Rfl: 0   vitamin B-12 (CYANOCOBALAMIN) 500 MCG tablet, Take 500 mcg by mouth daily., Disp: , Rfl:   EXAM:  GENERAL: alert, oriented, appears well and in no acute distress  HEENT: atraumatic, conjunttiva clear, no obvious abnormalities on inspection of external nose and ears  NECK: normal movements of the head and neck  LUNGS: on inspection no signs of respiratory  distress, breathing rate appears normal, no obvious gross SOB, gasping or wheezing.  Increased cough with forced expiration.    CV: no obvious cyanosis  PSYCH/NEURO: pleasant and cooperative, no obvious depression or anxiety, speech and thought processing grossly intact  ASSESSMENT AND PLAN:  Discussed the following assessment and plan:  Problem List Items Addressed This Visit     COVID-19 virus infection    Tested positive for covid today.  Symptoms started 03/17/21 as outlined.  Increased cough - coughing fits at times.  Treat with saline nasal spray and steroid nasal spray as outlined.  Can continue mucinex/robitussin as needed. Prednisone taper as directed.  Discussed oral antiviral medication and EUA.  Discussed possible side effects.  Treat with molnupiravir.  Discussed quarantine guidelines.  Follow.  Call with update.        Relevant Medications   molnupiravir EUA (LAGEVRIO) 200 mg CAPS capsule   Hyperglycemia    Prednisone being prescribed.  Stay hydrated.         Return if symptoms worsen or fail to improve.   I discussed the assessment and treatment plan with the patient. The patient was provided an opportunity to ask questions and all were answered. The patient agreed with the plan and demonstrated an understanding of the instructions.   The patient was advised to call back or seek an in-person evaluation if the symptoms worsen or if the condition fails to improve as anticipated.    Einar Pheasant, MD

## 2021-03-19 NOTE — Telephone Encounter (Signed)
Symptoms started Tuesday night like a head cold, then hacking  cough , no fever, yesterday runny nose cough better today. Would like to know what they should do .

## 2021-03-19 NOTE — Telephone Encounter (Signed)
See if can do virtual visit today

## 2021-03-23 ENCOUNTER — Encounter: Payer: Self-pay | Admitting: Internal Medicine

## 2021-03-23 DIAGNOSIS — U071 COVID-19: Secondary | ICD-10-CM | POA: Insufficient documentation

## 2021-03-23 NOTE — Assessment & Plan Note (Signed)
Prednisone being prescribed.  Stay hydrated.

## 2021-03-23 NOTE — Assessment & Plan Note (Signed)
Tested positive for covid today.  Symptoms started 03/17/21 as outlined.  Increased cough - coughing fits at times.  Treat with saline nasal spray and steroid nasal spray as outlined.  Can continue mucinex/robitussin as needed. Prednisone taper as directed.  Discussed oral antiviral medication and EUA.  Discussed possible side effects.  Treat with molnupiravir.  Discussed quarantine guidelines.  Follow.  Call with update.

## 2021-03-26 ENCOUNTER — Encounter: Payer: Self-pay | Admitting: Urology

## 2021-03-26 ENCOUNTER — Ambulatory Visit: Payer: Medicare Other | Admitting: Urology

## 2021-03-26 ENCOUNTER — Other Ambulatory Visit: Payer: Self-pay

## 2021-03-26 VITALS — BP 149/89 | HR 54 | Ht 71.0 in | Wt 175.0 lb

## 2021-03-26 DIAGNOSIS — Z125 Encounter for screening for malignant neoplasm of prostate: Secondary | ICD-10-CM

## 2021-03-26 DIAGNOSIS — N138 Other obstructive and reflux uropathy: Secondary | ICD-10-CM

## 2021-03-26 DIAGNOSIS — N401 Enlarged prostate with lower urinary tract symptoms: Secondary | ICD-10-CM

## 2021-03-26 LAB — BLADDER SCAN AMB NON-IMAGING: Scan Result: 6

## 2021-03-26 NOTE — Patient Instructions (Addendum)
Holmium Laser Enucleation of the Prostate (HoLEP)  HoLEP is a treatment for men with benign prostatic hyperplasia (BPH). The laser surgery removed blockages of urine flow, and is done without any incisions on the body.     What is HoLEP?  HoLEP is a type of laser surgery used to treat obstruction (blockage) of urine flow as a result of benign prostatic hyperplasia (BPH). In men with BPH, the prostate gland is not cancerous, but has become enlarged. An enlarged prostate can result in a number of urinary tract symptoms such as weak urinary stream, difficulty in starting urination, inability to urinate, frequent urination, or getting up at night to urinate.  HoLEP was developed in the 1990's as a more effective and less expensive surgical option for BPH, compared to other surgical options such as laser vaporization(PVP/greenlight laser), transurethral resection of the prostate(TURP), and open simple prostatectomy.   What happens during a HoLEP?  HoLEP requires general anesthesia (asleep throughout the procedure).   An antibiotic is given to reduce the risk of infection  A surgical instrument called a resectoscope is inserted through the urethra (the tube that carries urine from the bladder). The resectoscope has a camera that allows the surgeon to view the internal structure of the prostate gland, and to see where the incisions are being made during surgery.  The laser is inserted into the resectoscope and is used to enucleate (free up) the enlarged prostate tissue from the capsule (outer shell) and then to seal up any blood vessels. The tissue that has been removed is pushed back into the bladder.  A morcellator is placed through the resectoscope, and is used to suction out the prostate tissue that has been pushed into the bladder.  When the prostate tissue has been removed, the resectoscope is removed, and a foley catheter is placed to allow healing and drain the urine from the  bladder.     What happens after a HoLEP?  More than 90% of patients go home the same day a few hours after surgery. Less than 10% will be admitted to the hospital overnight for observation to monitor the urine, or if they have other medical problems.  Fluid is flushed through the catheter for about 1 hour after surgery to clear any blood from the urine. It is normal to have some blood in the urine after surgery. The need for blood transfusion is extremely rare.  Eating and drinking are permitted after the procedure once the patient has fully awakened from anesthesia.  The catheter is usually removed 2-3 days after surgery- the patient will come to clinic to have the catheter removed and make sure they can urinate on their own.  It is very important to drink lots of fluids after surgery for one week to keep the bladder flushed.  At first, there may be some burning with urination, but this typically improved within a few hours to days. Most patients do not have a significant amount of pain, and narcotic pain medications are rarely needed.  Symptoms of urinary frequency, urgency, and even leakage are NORMAL for the first few weeks after surgery as the bladder adjusts after having to work hard against blockage from the prostate for many years. This will improve, but can sometimes take several months.  The use of pelvic floor exercises (Kegel exercises) can help improve problems with urinary incontinence.   After catheter removal, patients will be seen at 6 weeks and 6 months for symptom check  No heavy lifting for  at least 2-3 weeks after surgery, however patients can walk and do light activities the first day after surgery. Return to work time depends on occupation.    What are the advantages of HoLEP?  HoLEP has been studied in many different parts of the world and has been shown to be a safe and effective procedure. Although there are many types of BPH surgeries available, HoLEP offers a  unique advantage in being able to remove a large amount of tissue without any incisions on the body, even in very large prostates, while decreasing the risk of bleeding and providing tissue for pathology (to look for cancer). This decreases the need for blood transfusions during surgery, minimizes hospital stay, and reduces the risk of needing repeat treatment.  What are the side effects of HoLEP?  Temporary burning and bleeding during urination. Some blood may be seen in the urine for weeks after surgery and is part of the healing process.  Urinary incontinence (inability to control urine flow) is expected in all patients immediately after surgery and they should wear pads for the first few days/weeks. This typically improves over the course of several weeks. Performing Kegel exercises can help decrease leakage from stress maneuvers such as coughing, sneezing, or lifting. The rate of long term leakage is very low. Patients may also have leakage with urgency and this may be treated with medication. The risk of urge incontinence can be dependent on several factors including age, prostate size, symptoms, and other medical problems.  Retrograde ejaculation or backwards ejaculation. In 75% of cases, the patient will not see any fluid during ejaculation after surgery.  Erectile function is generally not significantly affected.   What are the risks of HoLEP?  Injury to the urethra or development of scar tissue at a later date  Injury to the capsule of the prostate (typically treated with longer catheterization).  Injury to the bladder or ureteral orifices (where the urine from the kidney drains out)  Infection of the bladder, testes, or kidneys  Return of urinary obstruction at a later date requiring another operation (<2%)  Need for blood transfusion or re-operation due to bleeding  Failure to relieve all symptoms and/or need for prolonged catheterization after surgery  5-15% of patients are  found to have previously undiagnosed prostate cancer in their specimen. Prostate cancer can be treated after HoLEP.  Standard risks of anesthesia including blood clots, heart attacks, etc  When should I call my doctor?  Fever over 101.3 degrees  Inability to urinate, or large blood clots in the urine   Cystoscopy Cystoscopy is a procedure that is used to help diagnose and sometimes treat conditions that affect the lower urinary tract. The lower urinary tract includes the bladder and the urethra. The urethra is the tube that drains urine from the bladder. Cystoscopy is done using a thin, tube-shaped instrument with a light and camera at the end (cystoscope). The cystoscope may be hard or flexible, depending on the goal of the procedure. The cystoscope is inserted through the urethra, into the bladder. Cystoscopy may be recommended if you have: Urinary tract infections that keep coming back. Blood in the urine (hematuria). An inability to control when you urinate (urinary incontinence) or an overactive bladder. Unusual cells found in a urine sample. A blockage in the urethra, such as a urinary Ikner. Painful urination. An abnormality in the bladder found during an intravenous pyelogram (IVP) or CT scan. What are the risks? Generally, this is a safe procedure. However, problems  may occur, including: Infection. Bleeding.  What happens during the procedure?  You will be given one or more of the following: A medicine to numb the area (local anesthetic). The area around the opening of your urethra will be cleaned. The cystoscope will be passed through your urethra into your bladder. Germ-free (sterile) fluid will flow through the cystoscope to fill your bladder. The fluid will stretch your bladder so that your health care provider can clearly examine your bladder walls. Your doctor will look at the urethra and bladder. The cystoscope will be removed The procedure may vary among health care  providers  What can I expect after the procedure? After the procedure, it is common to have: Some soreness or pain in your urethra. Urinary symptoms. These include: Mild pain or burning when you urinate. Pain should stop within a few minutes after you urinate. This may last for up to a few days after the procedure. A small amount of blood in your urine for several days. Feeling like you need to urinate but producing only a small amount of urine. Follow these instructions at home: General instructions Return to your normal activities as told by your health care provider.  Drink plenty of fluids after the procedure. Keep all follow-up visits as told by your health care provider. This is important. Contact a health care provider if you: Have pain that gets worse or does not get better with medicine, especially pain when you urinate lasting longer than 72 hours after the procedure. Have trouble urinating. Get help right away if you: Have blood clots in your urine. Have a fever or chills. Are unable to urinate. Summary Cystoscopy is a procedure that is used to help diagnose and sometimes treat conditions that affect the lower urinary tract. Cystoscopy is done using a thin, tube-shaped instrument with a light and camera at the end. After the procedure, it is common to have some soreness or pain in your urethra. It is normal to have blood in your urine after the procedure.  If you were prescribed an antibiotic medicine, take it as told by your health care provider.  This information is not intended to replace advice given to you by your health care provider. Make sure you discuss any questions you have with your health care provider. Document Revised: 03/21/2018 Document Reviewed: 03/21/2018 Elsevier Patient Education  [place].

## 2021-03-26 NOTE — Progress Notes (Signed)
° °  03/26/2021 11:53 AM   Nathan Watts 10/13/1945 [id number]  Reason for visit: Follow up BPH, PSA screening  HPI: 75 year old relatively healthy male who had been followed over the last few years by Dr. Bernardo Heater.  He was stuck in the operating room today, and the patient opted to see me in clinic today.  To briefly summarize, he has a history of a negative prostate biopsy in 2017 for a PSA of 4.25 which showed a 97 g prostate and only benign pathology.  He has been on finasteride since that time and PSA has decreased appropriately, most recently 1.63 in November 2022, corrected for finasteride 3.26.  He reports some worsening of his urinary symptoms over the last year with difficulty urinating and straining to void, especially overnight, frequency, and some feeling of incomplete emptying.  He is interested in other options for his urinary symptoms.  Urinalysis 12/7 was completely benign, and PVR is normal today at 6 mL.  We discussed options moving forward with his significantly enlarged prostate that measured 97 g back in 2017.  We discussed medication options including addition of Flomax to the finasteride, or further evaluation with cystoscopy and TRUS for consideration of an outlet procedure.  He would likely be a better candidate for HOLEP with his likely greater >100 g prostate.  PSA reassuring and well within the normal range for his age.  RTC for cystoscopy and transrectal ultrasound for evaluation of outlet procedures  Billey Co, MD  [facility] [address], [place], [place] [address] [phone]

## 2021-03-31 ENCOUNTER — Other Ambulatory Visit: Payer: Self-pay | Admitting: Internal Medicine

## 2021-04-02 ENCOUNTER — Other Ambulatory Visit: Payer: Self-pay | Admitting: Nurse Practitioner

## 2021-04-02 ENCOUNTER — Ambulatory Visit: Payer: Self-pay | Admitting: Urology

## 2021-04-02 DIAGNOSIS — K760 Fatty (change of) liver, not elsewhere classified: Secondary | ICD-10-CM

## 2021-04-02 DIAGNOSIS — K219 Gastro-esophageal reflux disease without esophagitis: Secondary | ICD-10-CM | POA: Diagnosis not present

## 2021-04-02 DIAGNOSIS — R195 Other fecal abnormalities: Secondary | ICD-10-CM

## 2021-04-06 ENCOUNTER — Encounter: Payer: Self-pay | Admitting: Internal Medicine

## 2021-04-07 NOTE — Telephone Encounter (Signed)
Confirmed no sob, chest tightness, high fever, etc. Had covid 1 month ago. Scheduled for VV tomorrow with Dr Caryl Bis

## 2021-04-08 ENCOUNTER — Telehealth (INDEPENDENT_AMBULATORY_CARE_PROVIDER_SITE_OTHER): Payer: Medicare Other | Admitting: Family Medicine

## 2021-04-08 ENCOUNTER — Other Ambulatory Visit: Payer: Self-pay

## 2021-04-08 DIAGNOSIS — J069 Acute upper respiratory infection, unspecified: Secondary | ICD-10-CM | POA: Insufficient documentation

## 2021-04-08 MED ORDER — FLUTICASONE PROPIONATE 50 MCG/ACT NA SUSP: 2.0000 | Freq: Every day | NASAL | 0 refills | Status: DC

## 2021-04-08 NOTE — Assessment & Plan Note (Addendum)
The patient likely has a viral upper respiratory infection.  Advised it would be unlikely to be related to a bacterial sinus infection with his symptoms.  Discussed testing for COVID would not be necessary at this time given how recently he had COVID.  Discussed supportive care.  Advised he could continue with Mucinex and Benadryl over-the-counter.  We will send Flonase and for him to use as well for symptom management.  Discussed if he worsens at all in the next several days he should let us know to consider antibiotic treatment.  If he is not improving by next week he will contact us for reevaluation.

## 2021-04-08 NOTE — Progress Notes (Signed)
Virtual Visit via video Note  This visit type was conducted due to national recommendations for restrictions regarding the COVID-19 pandemic (e.g. social distancing).  This format is felt to be most appropriate for this patient at this time.  All issues noted in this document were discussed and addressed.  No physical exam was performed (except for noted visual exam findings with Video Visits).   I connected with Nathan Watts today at  4:00 PM EST by a video enabled telemedicine application and verified that I am speaking with the correct person using two identifiers. Location patient: home Location provider: work  Persons participating in the virtual visit: patient, provider  I discussed the limitations, risks, security and privacy concerns of performing an evaluation and management service by telephone and the availability of in person appointments. I also discussed with the patient that there may be a patient responsible charge related to this service. The patient expressed understanding and agreed to proceed.  Reason for visit: same day visit  HPI: Upper respiratory infection: Patient notes onset of symptoms about a week ago.  Started with rhinorrhea and burning in his sinuses.  He had some mild chest tightness with this.  He has had mild postnasal drip.  He notes a dry cough that is not significantly productive.  He is blowing clear mucus out of his nose.  No sore throat.  No taste or smell disturbances.  No shortness of breath or fever.  He started Benadryl yesterday and was taking Mucinex prior to that.  He notes that things have been beneficial.  Of note he did have COVID on 12/7 and was treated with Moducare of year.  Those symptoms did resolve prior to the onset of the symptoms.   ROS: See pertinent positives and negatives per HPI.  Past Medical History:  Diagnosis Date   Anemia, unspecified    BPH (benign prostatic hyperplasia)    GERD (gastroesophageal reflux disease)     Hypercholesterolemia    Hyperglycemia    Hypertension    Thrombocytopenia (HCC)    Thyroid nodule     Past Surgical History:  Procedure Laterality Date   COLONOSCOPY WITH PROPOFOL N/A 10/02/2014   Procedure: COLONOSCOPY WITH PROPOFOL;  Surgeon: Manya Silvas, MD;  Location: [facility] END[facility]COPY;  Service: Endoscopy;  Laterality: N/A;   INGUINAL HERNIA REPAIR     TONSILLECTOMY      Family History  Problem Relation Age of Onset   Prostate cancer Father    Heart disease Father        s/p CABG   Stroke Mother    Colon cancer Neg Hx     SOCIAL HX: Former smoker   Current Outpatient Medications:    aspirin 81 MG tablet, Take 81 mg by mouth daily., Disp: , Rfl:    atorvastatin (LIPITOR) 20 MG tablet, TAKE ONE TABLET BY MOUTH DAILY, Disp: 90 tablet, Rfl: 2   finasteride (PROSCAR) 5 MG tablet, TAKE 1 TABLET BY MOUTH DAILY, Disp: 90 tablet, Rfl: 3   fluticasone (FLONASE) 50 MCG/ACT nasal spray, Place 2 sprays into both nostrils daily., Disp: 16 g, Rfl: 0   Lactobacillus (PROBIOTIC ACIDOPHILUS PO), Take by mouth daily., Disp: , Rfl:    metroNIDAZOLE (METROGEL) 0.75 % gel, Apply topically 2 (two) times daily. to face, Disp: , Rfl:    Multiple Vitamin (MULTI VITAMIN MENS PO), Take 1 tablet by mouth daily., Disp: , Rfl:    Omega-3 Fatty Acids (FISH OIL) 1200 MG CAPS, Take 1 capsule by  mouth daily., Disp: , Rfl:    Omeprazole Magnesium (PRILOSEC OTC PO), Take by mouth daily., Disp: , Rfl:    pimecrolimus (ELIDEL) 1 % cream, APPLY TWICE DAILY AS NEEDED FOR FLARES, Disp: , Rfl:    predniSONE (DELTASONE) 10 MG tablet, Take 4 tablets x 1 day and then decrease by 1/2 tablet per day until down to zero mg., Disp: 18 tablet, Rfl: 0   vitamin B-12 (CYANOCOBALAMIN) 500 MCG tablet, Take 500 mcg by mouth daily., Disp: , Rfl:   EXAM:  VITALS per patient if applicable:  GENERAL: alert, oriented, appears well and in no acute distress  HEENT: atraumatic, conjunttiva clear, no obvious abnormalities on  inspection of external nose and ears  NECK: normal movements of the head and neck  LUNGS: on inspection no signs of respiratory distress, breathing rate appears normal, no obvious gross SOB, gasping or wheezing  CV: no obvious cyanosis  MS: moves all visible extremities without noticeable abnormality  PSYCH/NEURO: pleasant and cooperative, no obvious depression or anxiety, speech and thought processing grossly intact  ASSESSMENT AND PLAN:  Discussed the following assessment and plan:  Problem List Items Addressed This Visit     Upper respiratory infection    The patient likely has a viral upper respiratory infection.  Advised it would be unlikely to be related to a bacterial sinus infection with his symptoms.  Discussed testing for COVID would not be necessary at this time given how recently he had COVID.  Discussed supportive care.  Advised he could continue with Mucinex and Benadryl over-the-counter.  We will send Flonase and for him to use as well for symptom management.  Discussed if he worsens at all in the next several days he should let us know to consider antibiotic treatment.  If he is not improving by next week he will contact us for reevaluation.       Return if symptoms worsen or fail to improve.   I discussed the assessment and treatment plan with the patient. The patient was provided an opportunity to ask questions and all were answered. The patient agreed with the plan and demonstrated an understanding of the instructions.   The patient was advised to call back or seek an in-person evaluation if the symptoms worsen or if the condition fails to improve as anticipated.  Tommi Rumps, MD

## 2021-04-09 ENCOUNTER — Ambulatory Visit
Admission: RE | Admit: 2021-04-09 | Discharge: 2021-04-09 | Disposition: A | Discharge status: Home / Self Care | Payer: Medicare Other | Source: Ambulatory Visit | Attending: Nurse Practitioner | Admitting: Nurse Practitioner

## 2021-04-09 DIAGNOSIS — R195 Other fecal abnormalities: Secondary | ICD-10-CM | POA: Diagnosis not present

## 2021-04-09 DIAGNOSIS — K760 Fatty (change of) liver, not elsewhere classified: Secondary | ICD-10-CM | POA: Insufficient documentation

## 2021-04-09 DIAGNOSIS — I700 Atherosclerosis of aorta: Secondary | ICD-10-CM | POA: Diagnosis not present

## 2021-04-09 DIAGNOSIS — K7689 Other specified diseases of liver: Secondary | ICD-10-CM | POA: Diagnosis not present

## 2021-04-10 ENCOUNTER — Ambulatory Visit (INDEPENDENT_AMBULATORY_CARE_PROVIDER_SITE_OTHER): Payer: Medicare Other | Admitting: Urology

## 2021-04-10 ENCOUNTER — Other Ambulatory Visit: Payer: Self-pay | Admitting: Urology

## 2021-04-10 ENCOUNTER — Encounter: Payer: Self-pay | Admitting: Urology

## 2021-04-10 ENCOUNTER — Other Ambulatory Visit: Payer: Self-pay

## 2021-04-10 VITALS — BP 130/84 | HR 59 | Ht 71.0 in | Wt 186.0 lb

## 2021-04-10 DIAGNOSIS — N138 Other obstructive and reflux uropathy: Secondary | ICD-10-CM | POA: Diagnosis not present

## 2021-04-10 DIAGNOSIS — N401 Enlarged prostate with lower urinary tract symptoms: Secondary | ICD-10-CM

## 2021-04-10 DIAGNOSIS — D494 Neoplasm of unspecified behavior of bladder: Secondary | ICD-10-CM

## 2021-04-10 DIAGNOSIS — N329 Bladder disorder, unspecified: Secondary | ICD-10-CM

## 2021-04-10 NOTE — Progress Notes (Signed)
Surgical Physician Order Form [facility]  * Scheduling expectation : Next Available  *Length of Case: 2 hours  *Clearance needed: no  *Anticoagulation Instructions: Hold all anticoagulants  *Aspirin Instructions: Hold Aspirin  *Post-op visit Date/Instructions: 2-day Foley removal PA, MD follow-up TBD pending pathology  *Diagnosis: BPH with obstruction, bladder lesion  *Procedure: HOLEP, TURBT 2 cm   Additional orders: Gemcitabine 2000mg  bladder instillation  -Admit type: OUTpatient  -Anesthesia: General  -VTE Prophylaxis Standing Order SCDs       Other:   -Standing Lab Orders Per Anesthesia    Lab other: UA&Urine Culture  -Standing Test orders EKG/Chest x-ray per Anesthesia       Test other:   - Medications:  Ancef 2gm IV  -Other orders:  N/A

## 2021-04-10 NOTE — Patient Instructions (Addendum)
Holmium Laser Enucleation of the Prostate (HoLEP)  HoLEP is a treatment for men with benign prostatic hyperplasia (BPH). The laser surgery removed blockages of urine flow, and is done without any incisions on the body.     What is HoLEP?  HoLEP is a type of laser surgery used to treat obstruction (blockage) of urine flow as a result of benign prostatic hyperplasia (BPH). In men with BPH, the prostate gland is not cancerous, but has become enlarged. An enlarged prostate can result in a number of urinary tract symptoms such as weak urinary stream, difficulty in starting urination, inability to urinate, frequent urination, or getting up at night to urinate.  HoLEP was developed in the 1990's as a more effective and less expensive surgical option for BPH, compared to other surgical options such as laser vaporization(PVP/greenlight laser), transurethral resection of the prostate(TURP), and open simple prostatectomy.   What happens during a HoLEP?  HoLEP requires general anesthesia (asleep throughout the procedure).   An antibiotic is given to reduce the risk of infection  A surgical instrument called a resectoscope is inserted through the urethra (the tube that carries urine from the bladder). The resectoscope has a camera that allows the surgeon to view the internal structure of the prostate gland, and to see where the incisions are being made during surgery.  The laser is inserted into the resectoscope and is used to enucleate (free up) the enlarged prostate tissue from the capsule (outer shell) and then to seal up any blood vessels. The tissue that has been removed is pushed back into the bladder.  A morcellator is placed through the resectoscope, and is used to suction out the prostate tissue that has been pushed into the bladder.  When the prostate tissue has been removed, the resectoscope is removed, and a foley catheter is placed to allow healing and drain the urine from the  bladder.     What happens after a HoLEP?  More than 90% of patients go home the same day a few hours after surgery. Less than 10% will be admitted to the hospital overnight for observation to monitor the urine, or if they have other medical problems.  Fluid is flushed through the catheter for about 1 hour after surgery to clear any blood from the urine. It is normal to have some blood in the urine after surgery. The need for blood transfusion is extremely rare.  Eating and drinking are permitted after the procedure once the patient has fully awakened from anesthesia.  The catheter is usually removed 2-3 days after surgery- the patient will come to clinic to have the catheter removed and make sure they can urinate on their own.  It is very important to drink lots of fluids after surgery for one week to keep the bladder flushed.  At first, there may be some burning with urination, but this typically improved within a few hours to days. Most patients do not have a significant amount of pain, and narcotic pain medications are rarely needed.  Symptoms of urinary frequency, urgency, and even leakage are NORMAL for the first few weeks after surgery as the bladder adjusts after having to work hard against blockage from the prostate for many years. This will improve, but can sometimes take several months.  The use of pelvic floor exercises (Kegel exercises) can help improve problems with urinary incontinence.   After catheter removal, patients will be seen at 6 weeks and 6 months for symptom check  No heavy lifting for  at least 2-3 weeks after surgery, however patients can walk and do light activities the first day after surgery. Return to work time depends on occupation.    What are the advantages of HoLEP?  HoLEP has been studied in many different parts of the world and has been shown to be a safe and effective procedure. Although there are many types of BPH surgeries available, HoLEP offers a  unique advantage in being able to remove a large amount of tissue without any incisions on the body, even in very large prostates, while decreasing the risk of bleeding and providing tissue for pathology (to look for cancer). This decreases the need for blood transfusions during surgery, minimizes hospital stay, and reduces the risk of needing repeat treatment.  What are the side effects of HoLEP?  Temporary burning and bleeding during urination. Some blood may be seen in the urine for weeks after surgery and is part of the healing process.  Urinary incontinence (inability to control urine flow) is expected in all patients immediately after surgery and they should wear pads for the first few days/weeks. This typically improves over the course of several weeks. Performing Kegel exercises can help decrease leakage from stress maneuvers such as coughing, sneezing, or lifting. The rate of long term leakage is very low. Patients may also have leakage with urgency and this may be treated with medication. The risk of urge incontinence can be dependent on several factors including age, prostate size, symptoms, and other medical problems.  Retrograde ejaculation or backwards ejaculation. In 75% of cases, the patient will not see any fluid during ejaculation after surgery.  Erectile function is generally not significantly affected.   What are the risks of HoLEP?  Injury to the urethra or development of scar tissue at a later date  Injury to the capsule of the prostate (typically treated with longer catheterization).  Injury to the bladder or ureteral orifices (where the urine from the kidney drains out)  Infection of the bladder, testes, or kidneys  Return of urinary obstruction at a later date requiring another operation (<2%)  Need for blood transfusion or re-operation due to bleeding  Failure to relieve all symptoms and/or need for prolonged catheterization after surgery  5-15% of patients are  found to have previously undiagnosed prostate cancer in their specimen. Prostate cancer can be treated after HoLEP.  Standard risks of anesthesia including blood clots, heart attacks, etc  When should I call my doctor?  Fever over 101.3 degrees  Inability to urinate, or large blood clots in the urine   Transurethral Resection of Bladder Tumor Transurethral resection of a bladder tumor is the removal (resection) of a cancerous growth (tumor) on the inside wall of the bladder. The bladder is the organ that holds urine. The tumor is removed through the tube that carries urine out of the body (urethra). In a transurethral resection, a thin telescope with a light, a tiny camera, and an electric cutting edge (resectoscope) is passed through the urethra. In men, the opening of the urethra is at the end of the penis. In women, it is just above the opening of the vagina. Tell a health care provider about: Any allergies you have. All medicines you are taking, including vitamins, herbs, eye drops, creams, and over-the-counter medicines. Any problems you or family members have had with anesthetic medicines. Any blood disorders you have. Any surgeries you have had. Any medical conditions you have. Any recent urinary tract infections you have had. Whether you are  pregnant or may be pregnant. What are the risks? Generally, this is a safe procedure. However, problems may occur, including: Infection. Bleeding. Allergic reactions to medicines. Damage to nearby structures or organs, such as: The urethra. The tubes that drain urine from the kidneys into the bladder (ureters). Pain and burning during urination. Difficulty urinating due to partial blockage of the urethra. Inability to urinate (urinary retention). What happens before the procedure? Staying hydrated Follow instructions from your health care provider about hydration, which may include: Up to 2 hours before the procedure - you may continue  to drink clear liquids, such as water, clear fruit juice, black coffee, and plain tea.  Eating and drinking restrictions Follow instructions from your health care provider about eating and drinking, which may include: 8 hours before the procedure - stop eating heavy meals or foods, such as meat, fried foods, or fatty foods. 6 hours before the procedure - stop eating light meals or foods, such as toast or cereal. 6 hours before the procedure - stop drinking milk or drinks that contain milk. 2 hours before the procedure - stop drinking clear liquids. Medicines Ask your health care provider about: Changing or stopping your regular medicines. This is especially important if you are taking diabetes medicines or blood thinners. Taking medicines such as aspirin and ibuprofen. These medicines can thin your blood. Do not take these medicines unless your health care provider tells you to take them. Taking over-the-counter medicines, vitamins, herbs, and supplements. Tests You may have exams or tests, including: Physical exam. Blood tests. Urine tests. Electrocardiogram (ECG). This test measures the electrical activity of the heart. General instructions Plan to have someone take you home from the hospital or clinic. Ask your health care provider how your surgical site will be marked or identified. Ask your health care provider what steps will be taken to help prevent infection. These may include: Washing skin with a germ-killing soap. Taking antibiotic medicine. What happens during the procedure? An IV will be inserted into one of your veins. You will be given one or more of the following: A medicine to help you relax (sedative). A medicine to make you fall asleep (general anesthetic). A medicine that is injected into your spine to numb the area below and slightly above the injection site (spinal anesthetic). Your legs will be placed in foot rests (stirrups) so that your legs are apart and your  knees are bent. The resectoscope will be passed through your urethra and into your bladder. The part of your bladder that is affected by the tumor will be resected using the cutting edge of the resectoscope. The resectoscope will be removed. A thin, flexible tube (catheter) will be passed through your urethra and into your bladder. The catheter will drain urine into a bag outside of your body. Fluid may be passed through the catheter to keep the catheter open. The procedure may vary among health care providers and hospitals. What happens after the procedure? Your blood pressure, heart rate, breathing rate, and blood oxygen level will be monitored until you leave the hospital or clinic. You may continue to receive fluids and medicines through an IV. You will have some pain. You will be given pain medicine to relieve pain. You will have a catheter to drain your urine. You will have blood in your urine. Your catheter may be kept in until your urine is clear. The amount of urine will be monitored. If necessary, your bladder may be rinsed out (irrigated) by passing fluid  through your catheter. You will be encouraged to walk around as soon as possible. You may have to wear compression stockings. These stockings help to prevent blood clots and reduce swelling in your legs. Do not drive for 24 hours if you were given a sedative during your procedure. Summary Transurethral resection of a bladder tumor is the removal (resection) of a cancerous growth (tumor) on the inside wall of the bladder. To do this procedure, your health care provider uses a thin telescope with a light, a tiny camera, and an electric cutting edge (resectoscope). Follow your health care provider's instructions. You may need to stop or change certain medicines, and you may be told to stop eating and drinking several hours before the procedure. Your blood pressure, heart rate, breathing rate, and blood oxygen level will be monitored  until you leave the hospital or clinic. You may have to wear compression stockings. These stockings help to prevent blood clots and reduce swelling in your legs. This information is not intended to replace advice given to you by your health care provider. Make sure you discuss any questions you have with your health care provider. Document Revised: 10/27/2017 Document Reviewed: 10/28/2017 Elsevier Patient Education  [place].

## 2021-04-10 NOTE — Progress Notes (Signed)
Cystoscopy Procedure Note:  Indication: BPH, urinary symptoms of straining, frequency, feeling of incomplete emptying  After informed consent and discussion of the procedure and its risks, Eriverto Byrnes was positioned and prepped in the standard fashion. Cystoscopy was performed with a flexible cystoscope. The urethra, bladder neck and entire bladder was visualized in a standard fashion. The prostate was large with a high bladder neck. The ureteral orifices were visualized in their normal location and orientation.  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   Imaging: The transrectal ultrasound was inserted into the rectum, and measurements taken to calculate a prostate volume of 70 g.  I could also see the small ~2 cm papillary lesion at the trigone.  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  PSAs have been normal.  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  We discussed transurethral resection of bladder tumor (TURBT) and risks and benefits at length. This is typically a 1 to 2-hour procedure done under general anesthesia in the operating room.  A scope is inserted through the urethra and used to resect abnormal tissue within the bladder, which is then sent to the pathologist to determine grade  and stage of the tumor.  Risks include bleeding, infection, need for temporary Foley placement, and bladder perforation.  Treatment strategies are based on the type of tumor and depth of invasion.  We briefly reviewed the different treatment pathways for non-muscle invasive and muscle invasive bladder cancer.  Regarding his urinary symptoms, we discussed the options between UroLift and HOLEP.  With his large prostate and long-term symptoms, I think he would benefit more from a more definitive procedure with HOLEP. We discussed the risks and benefits of HoLEP at length.  The procedure requires general anesthesia and takes 1 to 2 hours, and a holmium laser is used to enucleate the prostate and push this tissue into the bladder.  A morcellator is then used to remove this tissue, which is sent for pathology.  The vast majority(>95%) of patients are able to discharge the same day with a catheter in place for 2 to 3 days, and will follow-up in clinic for a voiding trial.  We specifically discussed the risks of bleeding, infection, retrograde ejaculation, temporary urgency and urge incontinence, very low risk of long-term incontinence, urethral stricture/bladder neck contracture, pathologic evaluation of prostate tissue and possible detection of prostate cancer or other malignancy, and possible need for additional procedures.  Schedule TURBT and HOLEP  Nickolas Madrid, MD 04/10/2021

## 2021-04-14 NOTE — Progress Notes (Signed)
[facility] Posting Form   Surgery Date/Time: Date: 04/24/2021  Surgeon: Dr. 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

## 2021-04-14 NOTE — Addendum Note (Signed)
Addended by: Tyren Leitz A on: 04/14/2021 08:48 AM   Modules accepted: Orders

## 2021-04-16 ENCOUNTER — Other Ambulatory Visit: Payer: Medicare Other

## 2021-04-16 ENCOUNTER — Other Ambulatory Visit: Payer: Self-pay

## 2021-04-16 DIAGNOSIS — N401 Enlarged prostate with lower urinary tract symptoms: Secondary | ICD-10-CM | POA: Diagnosis not present

## 2021-04-16 DIAGNOSIS — N329 Bladder disorder, unspecified: Secondary | ICD-10-CM | POA: Diagnosis not present

## 2021-04-16 DIAGNOSIS — N138 Other obstructive and reflux uropathy: Secondary | ICD-10-CM

## 2021-04-17 ENCOUNTER — Other Ambulatory Visit
Admission: RE | Admit: 2021-04-17 | Discharge: 2021-04-17 | Disposition: A | Discharge status: Home / Self Care | Payer: Medicare Other | Source: Ambulatory Visit | Attending: Urology | Admitting: Urology

## 2021-04-17 ENCOUNTER — Ambulatory Visit (LOCAL_COMMUNITY_HEALTH_CENTER): Payer: Medicare Other

## 2021-04-17 DIAGNOSIS — Z23 Encounter for immunization: Secondary | ICD-10-CM

## 2021-04-17 LAB — URINALYSIS, COMPLETE
Bilirubin, UA: NEGATIVE
Glucose, UA: NEGATIVE
Ketones, UA: NEGATIVE
Leukocytes,UA: NEGATIVE
Nitrite, UA: NEGATIVE
Protein,UA: NEGATIVE
RBC, UA: NEGATIVE
Specific Gravity, UA: 1.015 (ref 1.005–1.030)
Urobilinogen, Ur: 0.2 mg/dL (ref 0.2–1.0)
pH, UA: 6 (ref 5.0–7.5)

## 2021-04-17 LAB — MICROSCOPIC EXAMINATION: Bacteria, UA: NONE SEEN

## 2021-04-17 NOTE — Patient Instructions (Signed)
Your procedure is scheduled on: Friday April 24, 2021. Report to Day Surgery inside [facility] 2nd floor. To find out your arrival time please call [phone] between 1PM - 3PM on Thursday April 23, 2021.  Remember: Instructions that are not followed completely may result in serious medical risk,  up to and including death, or upon the discretion of your surgeon and anesthesiologist your  surgery may need to be rescheduled.     _X__ 1. Do not eat food or drink fluids after midnight the night before your procedure.                 No chewing gum or hard candies.   __X__2.  On the morning of surgery brush your teeth with toothpaste and water, you                may rinse your mouth with mouthwash if you wish.  Do not swallow any toothpaste or mouthwash.     _X__ 3.  No Alcohol for 24 hours before or after surgery.   _X__ 4.  Do Not Smoke or use e-cigarettes For 24 Hours Prior to Your Surgery.                 Do not use any chewable tobacco products for at least 6 hours prior to                 Surgery.  _X__  5.  Do not use any recreational drugs (marijuana, cocaine, heroin, ecstasy, MDMA or other)                For at least one week prior to your surgery.  Combination of these drugs with anesthesia                May have life threatening results.  __X__ 6.  Notify your doctor if there is any change in your medical condition      (cold, fever, infections).     Do not wear jewelry, make-up, hairpins, clips or nail polish. Do not wear lotions, powders, or perfumes. You may wear deodorant. Do not shave 48 hours prior to surgery. Men may shave face and neck. Do not bring valuables to the hospital.    [facility] is not responsible for any belongings or valuables.  Contacts, dentures or bridgework may not be worn into surgery. Leave your suitcase in the car. After surgery it may be brought to your room. For patients admitted to the hospital, discharge  time is determined by your treatment team.   Patients discharged the day of surgery will not be allowed to drive home.   Make arrangements for someone to be with you for the first 24 hours of your Same Day Discharge.   __X__ Take these medicines the morning of surgery with A SIP OF WATER:    1. atorvastatin (LIPITOR) 20 MG   2. Omeprazole Magnesium (PRILOSEC OTC PO)  3.   4.  5.  6.  ____ Fleet Enema (as directed)   ____ Use CHG Soap (or wipes) as directed  ____ Use Benzoyl Peroxide Gel as instructed  ____ Use inhalers on the day of surgery  ____ Stop metformin 2 days prior to surgery    ____ Take 1/2 of usual insulin dose the night before surgery. No insulin the morning          of surgery.   ____ Call your PCP, cardiologist, or Pulmonologist if taking Coumadin/Plavix/aspirin and ask when  to stop before your surgery.   __X__ One Week prior to surgery- Stop Anti-inflammatories such as Ibuprofen, Aleve, Advil, Motrin, meloxicam (MOBIC), diclofenac, etodolac, ketorolac, Toradol, Daypro, piroxicam, Goody's or BC powders. OK TO USE TYLENOL IF NEEDED   __X__ Stop supplements until after surgery.    ____ Bring C-Pap to the hospital.    If you have any questions regarding your pre-procedure instructions,  Please call Pre-admit Testing at [phone]

## 2021-04-17 NOTE — Patient Instructions (Signed)
Your procedure is scheduled on: Friday April 24, 2021. Report to Day Surgery inside [facility] 2nd floor.  To find out your arrival time please call [phone] between 1PM - 3PM on Thursday April 23, 2021.  Remember: Instructions that are not followed completely may result in serious medical risk,  up to and including death, or upon the discretion of your surgeon and anesthesiologist your  surgery may need to be rescheduled.     _X__ 1. Do not eat food or drink fluids after midnight the night before your procedure.                 No chewing gum or hard candies.  __X__2.  On the morning of surgery brush your teeth with toothpaste and water, you                may rinse your mouth with mouthwash if you wish.  Do not swallow any toothpaste or mouthwash.     _X__ 3.  No Alcohol for 24 hours before or after surgery.   _X__ 4.  Do Not Smoke or use e-cigarettes For 24 Hours Prior to Your Surgery.                 Do not use any chewable tobacco products for at least 6 hours prior to                 Surgery.  _X__  5.  Do not use any recreational drugs (marijuana, cocaine, heroin, ecstasy, MDMA or other)                For at least one week prior to your surgery.  Combination of these drugs with anesthesia                May have life threatening results.  __X__.  Notify your doctor if there is any change in your medical condition      (cold, fever, infections).     Do not wear jewelry, make-up, hairpins, clips or nail polish. Do not wear lotions, powders, or perfumes.  Do not shave 48 hours prior to surgery. Men may shave face and neck. Do not bring valuables to the hospital.    [facility] is not responsible for any belongings or valuables.  Contacts, dentures or bridgework may not be worn into surgery. Leave your suitcase in the car. After surgery it may be brought to your room. For patients admitted to the hospital, discharge time is determined by  your treatment team.   Patients discharged the day of surgery will not be allowed to drive home.   Make arrangements for someone to be with you for the first 24 hours of your Same Day Discharge.   __X__ Take these medicines the morning of surgery with A SIP OF WATER:    1. finasteride (PROSCAR) 5 MG   2. Omeprazole Magnesium (PRILOSEC OTC PO)  3.   4.  5.  6.  ____ Fleet Enema (as directed)   ____ Use CHG Soap (or wipes) as directed  ____ Use Benzoyl Peroxide Gel as instructed  ____ Use inhalers on the day of surgery  ____ Stop metformin 2 days prior to surgery    ____ Take 1/2 of usual insulin dose the night before surgery. No insulin the morning          of surgery.   __X__ Stop aspirin 81 mg 5 days before your surgery as instructed by your doctor.  __X__  One Week prior to surgery- Stop Anti-inflammatories such as Ibuprofen, Aleve, Advil, Motrin, meloxicam (MOBIC), diclofenac, etodolac, ketorolac, Toradol, Daypro, piroxicam, Goody's or BC powders. OK TO USE TYLENOL IF NEEDED   __X__ Stop supplements until after surgery.    ____ Bring C-Pap to the hospital.    If you have any questions regarding your pre-procedure instructions,  Please call Pre-admit Testing at [phone]

## 2021-04-17 NOTE — Progress Notes (Signed)
In Nurse Clinic requesting Hep B vaccine. Per Epic, Low Hep B titer (<3.1) on 04/02/2021. Per [facility] review, pt completed Hep B vaccine series (Twinrix) 2018. Per SO Dr Lubertha Sayres, one dose Hep B vaccine indicated and another Hep B titer in 1 - 2 months following dose of Hep B vaccine. RN explained this to pt and he is in agreement. Heplisav B given today without problem. Updated [facility] copy given and explained. Pt plans to call for Hep B titer in 1 - 2 months. Josie Saunders, RN

## 2021-04-19 LAB — CULTURE, URINE COMPREHENSIVE

## 2021-04-20 ENCOUNTER — Encounter
Admission: RE | Admit: 2021-04-20 | Discharge: 2021-04-20 | Disposition: A | Discharge status: Home / Self Care | Payer: Medicare Other | Source: Ambulatory Visit | Attending: Urology | Admitting: Urology

## 2021-04-20 ENCOUNTER — Other Ambulatory Visit: Payer: Self-pay

## 2021-04-20 DIAGNOSIS — I10 Essential (primary) hypertension: Secondary | ICD-10-CM | POA: Diagnosis not present

## 2021-04-20 DIAGNOSIS — Z01810 Encounter for preprocedural cardiovascular examination: Secondary | ICD-10-CM | POA: Insufficient documentation

## 2021-04-24 ENCOUNTER — Ambulatory Visit
Admission: RE | Admit: 2021-04-24 | Discharge: 2021-04-24 | Disposition: A | Discharge status: Home / Self Care | Payer: Medicare Other | Attending: Urology | Admitting: Urology

## 2021-04-24 ENCOUNTER — Ambulatory Visit: Payer: Medicare Other

## 2021-04-24 ENCOUNTER — Encounter: Payer: Self-pay | Admitting: Urology

## 2021-04-24 ENCOUNTER — Encounter
Admission: RE | Disposition: A | Discharge status: Home / Self Care | Payer: Self-pay | Source: Home / Self Care | Attending: Urology

## 2021-04-24 ENCOUNTER — Ambulatory Visit: Payer: Medicare Other | Admitting: Anesthesiology

## 2021-04-24 DIAGNOSIS — N401 Enlarged prostate with lower urinary tract symptoms: Secondary | ICD-10-CM | POA: Insufficient documentation

## 2021-04-24 DIAGNOSIS — I2510 Atherosclerotic heart disease of native coronary artery without angina pectoris: Secondary | ICD-10-CM | POA: Insufficient documentation

## 2021-04-24 DIAGNOSIS — D649 Anemia, unspecified: Secondary | ICD-10-CM | POA: Insufficient documentation

## 2021-04-24 DIAGNOSIS — E7800 Pure hypercholesterolemia, unspecified: Secondary | ICD-10-CM | POA: Insufficient documentation

## 2021-04-24 DIAGNOSIS — N138 Other obstructive and reflux uropathy: Secondary | ICD-10-CM | POA: Insufficient documentation

## 2021-04-24 DIAGNOSIS — N329 Bladder disorder, unspecified: Secondary | ICD-10-CM

## 2021-04-24 DIAGNOSIS — Z79899 Other long term (current) drug therapy: Secondary | ICD-10-CM | POA: Insufficient documentation

## 2021-04-24 DIAGNOSIS — C676 Malignant neoplasm of ureteric orifice: Secondary | ICD-10-CM | POA: Diagnosis not present

## 2021-04-24 DIAGNOSIS — K219 Gastro-esophageal reflux disease without esophagitis: Secondary | ICD-10-CM | POA: Insufficient documentation

## 2021-04-24 DIAGNOSIS — D494 Neoplasm of unspecified behavior of bladder: Secondary | ICD-10-CM | POA: Diagnosis present

## 2021-04-24 DIAGNOSIS — I10 Essential (primary) hypertension: Secondary | ICD-10-CM | POA: Diagnosis not present

## 2021-04-24 DIAGNOSIS — Z87891 Personal history of nicotine dependence: Secondary | ICD-10-CM | POA: Diagnosis not present

## 2021-04-24 HISTORY — PX: HOLEP-LASER ENUCLEATION OF THE PROSTATE WITH MORCELLATION: SHX6641

## 2021-04-24 HISTORY — PX: TRANSURETHRAL RESECTION OF BLADDER TUMOR WITH MITOMYCIN-C: SHX6459

## 2021-04-24 SURGERY — ENUCLEATION, PROSTATE, USING LASER, WITH MORCELLATION
Anesthesia: General

## 2021-04-24 MED ORDER — HYDROCODONE-ACETAMINOPHEN 5-325 MG PO TABS: 1.0000 | ORAL_TABLET | Freq: Four times a day (QID) | ORAL | 0 refills | Status: AC | PRN

## 2021-04-24 MED ORDER — ROCURONIUM BROMIDE 10 MG/ML (PF) SYRINGE
PREFILLED_SYRINGE | INTRAVENOUS | Status: AC
  Filled 2021-04-24: qty 10

## 2021-04-24 MED ORDER — SUGAMMADEX SODIUM 200 MG/2ML IV SOLN
INTRAVENOUS | Status: DC | PRN
  Administered 2021-04-24: 200 mg via INTRAVENOUS

## 2021-04-24 MED ORDER — CHLORHEXIDINE GLUCONATE 0.12 % MT SOLN
OROMUCOSAL | Status: AC
  Filled 2021-04-24: qty 15

## 2021-04-24 MED ORDER — GEMCITABINE CHEMO FOR BLADDER INSTILLATION 2000 MG
2000.0000 mg | Freq: Once | INTRAVENOUS | Status: DC
  Filled 2021-04-24: qty 52.6

## 2021-04-24 MED ORDER — PROPOFOL 10 MG/ML IV BOLUS
INTRAVENOUS | Status: DC | PRN
  Administered 2021-04-24: 160 mg via INTRAVENOUS

## 2021-04-24 MED ORDER — ACETAMINOPHEN 10 MG/ML IV SOLN
INTRAVENOUS | Status: AC
  Filled 2021-04-24: qty 100

## 2021-04-24 MED ORDER — CHLORHEXIDINE GLUCONATE 0.12 % MT SOLN
15.0000 mL | Freq: Once | OROMUCOSAL | Status: AC
  Administered 2021-04-24: 15 mL via OROMUCOSAL

## 2021-04-24 MED ORDER — DEXAMETHASONE SODIUM PHOSPHATE 10 MG/ML IJ SOLN
INTRAMUSCULAR | Status: DC | PRN
  Administered 2021-04-24: 10 mg via INTRAVENOUS

## 2021-04-24 MED ORDER — ONDANSETRON HCL 4 MG/2ML IJ SOLN
INTRAMUSCULAR | Status: DC | PRN
  Administered 2021-04-24: 4 mg via INTRAVENOUS

## 2021-04-24 MED ORDER — CEFAZOLIN SODIUM-DEXTROSE 2-4 GM/100ML-% IV SOLN
2.0000 g | INTRAVENOUS | Status: AC
  Administered 2021-04-24: 2 g via INTRAVENOUS

## 2021-04-24 MED ORDER — LIDOCAINE HCL (CARDIAC) PF 100 MG/5ML IV SOSY
PREFILLED_SYRINGE | INTRAVENOUS | Status: DC | PRN
  Administered 2021-04-24: 100 mg via INTRAVENOUS

## 2021-04-24 MED ORDER — LIDOCAINE HCL (PF) 2 % IJ SOLN
INTRAMUSCULAR | Status: AC
  Filled 2021-04-24: qty 5

## 2021-04-24 MED ORDER — ONDANSETRON HCL 4 MG/2ML IJ SOLN: 4.0000 mg | Freq: Once | INTRAMUSCULAR | Status: DC | PRN

## 2021-04-24 MED ORDER — FENTANYL CITRATE (PF) 100 MCG/2ML IJ SOLN
25.0000 ug | INTRAMUSCULAR | Status: DC | PRN
  Administered 2021-04-24: 50 ug via INTRAVENOUS

## 2021-04-24 MED ORDER — ONDANSETRON HCL 4 MG/2ML IJ SOLN
INTRAMUSCULAR | Status: AC
  Filled 2021-04-24: qty 2

## 2021-04-24 MED ORDER — LACTATED RINGERS IV SOLN: INTRAVENOUS | Status: DC

## 2021-04-24 MED ORDER — ROCURONIUM BROMIDE 100 MG/10ML IV SOLN
INTRAVENOUS | Status: DC | PRN
  Administered 2021-04-24: 50 mg via INTRAVENOUS
  Administered 2021-04-24: 10 mg via INTRAVENOUS

## 2021-04-24 MED ORDER — EPHEDRINE SULFATE 50 MG/ML IJ SOLN
INTRAMUSCULAR | Status: DC | PRN
  Administered 2021-04-24: 10 mg via INTRAVENOUS

## 2021-04-24 MED ORDER — CEFAZOLIN SODIUM-DEXTROSE 2-4 GM/100ML-% IV SOLN
INTRAVENOUS | Status: AC
  Filled 2021-04-24: qty 100

## 2021-04-24 MED ORDER — FENTANYL CITRATE (PF) 100 MCG/2ML IJ SOLN
INTRAMUSCULAR | Status: AC
  Administered 2021-04-24: 50 ug via INTRAVENOUS
  Filled 2021-04-24: qty 2

## 2021-04-24 MED ORDER — FENTANYL CITRATE (PF) 100 MCG/2ML IJ SOLN
INTRAMUSCULAR | Status: AC
  Filled 2021-04-24: qty 2

## 2021-04-24 MED ORDER — DEXAMETHASONE SODIUM PHOSPHATE 10 MG/ML IJ SOLN
INTRAMUSCULAR | Status: AC
  Filled 2021-04-24: qty 1

## 2021-04-24 MED ORDER — SODIUM CHLORIDE 0.9 % IR SOLN
Status: DC | PRN
  Administered 2021-04-24: 50000 mL via INTRAVESICAL

## 2021-04-24 MED ORDER — FENTANYL CITRATE (PF) 100 MCG/2ML IJ SOLN
INTRAMUSCULAR | Status: DC | PRN
  Administered 2021-04-24 (×2): 25 ug via INTRAVENOUS
  Administered 2021-04-24: 50 ug via INTRAVENOUS

## 2021-04-24 MED ORDER — ORAL CARE MOUTH RINSE: 15.0000 mL | Freq: Once | OROMUCOSAL | Status: AC

## 2021-04-24 MED ORDER — ACETAMINOPHEN 10 MG/ML IV SOLN
INTRAVENOUS | Status: DC | PRN
  Administered 2021-04-24: 1000 mg via INTRAVENOUS

## 2021-04-24 SURGICAL SUPPLY — 50 items
ADAPTER IRRIG TUBE 2 SPIKE SOL (ADAPTER) ×4 IMPLANT
BAG DRAIN CYSTO-URO LG1000N (MISCELLANEOUS) ×2 IMPLANT
BAG URINE DRAIN 2000ML AR STRL (UROLOGICAL SUPPLIES) ×2 IMPLANT
BAG URO DRAIN 4000ML (MISCELLANEOUS) ×2 IMPLANT
BRUSH SCRUB EZ  4% CHG (MISCELLANEOUS) ×1
BRUSH SCRUB EZ 4% CHG (MISCELLANEOUS) ×1 IMPLANT
CATH FOL 2WAY LX 18X30 (CATHETERS) ×2 IMPLANT
CATH FOLEY 3WAY 30CC 24FR (CATHETERS) ×1
CATH URETL OPEN END 4X70 (CATHETERS) ×2 IMPLANT
CATH URTH STD 24FR FL 3W 2 (CATHETERS) ×1 IMPLANT
CONTAINER COLLECT MORCELLATR (MISCELLANEOUS) ×1 IMPLANT
DRAPE UTILITY 15X26 TOWEL STRL (DRAPES) ×2 IMPLANT
DRSG TELFA 4X3 1S NADH ST (GAUZE/BANDAGES/DRESSINGS) ×2 IMPLANT
ELECT BIVAP BIPO 22/24 DONUT (ELECTROSURGICAL)
ELECT LOOP 22F BIPOLAR SML (ELECTROSURGICAL)
ELECT REM PT RETURN 9FT ADLT (ELECTROSURGICAL) ×2
ELECTRD BIVAP BIPO 22/24 DONUT (ELECTROSURGICAL) IMPLANT
ELECTRODE LOOP 22F BIPOLAR SML (ELECTROSURGICAL) IMPLANT
ELECTRODE REM PT RTRN 9FT ADLT (ELECTROSURGICAL) IMPLANT
FIBER LASER MOSES 550 DFL (Laser) ×1 IMPLANT
FILTER OVERFLOW MORCELLATOR (FILTER) ×1 IMPLANT
GAUZE 4X4 16PLY ~~LOC~~+RFID DBL (SPONGE) ×4 IMPLANT
GLOVE SURG UNDER POLY LF SZ7.5 (GLOVE) ×2 IMPLANT
GOWN STRL REUS W/ TWL LRG LVL3 (GOWN DISPOSABLE) ×1 IMPLANT
GOWN STRL REUS W/ TWL XL LVL3 (GOWN DISPOSABLE) ×1 IMPLANT
GOWN STRL REUS W/TWL LRG LVL3 (GOWN DISPOSABLE) ×1
GOWN STRL REUS W/TWL XL LVL3 (GOWN DISPOSABLE) ×1
HOLDER FOLEY CATH W/STRAP (MISCELLANEOUS) ×2 IMPLANT
IV NS IRRIG 3000ML ARTHROMATIC (IV SOLUTION) ×14 IMPLANT
KIT TURNOVER CYSTO (KITS) ×2 IMPLANT
LOOP CUT BIPOLAR 24F LRG (ELECTROSURGICAL) IMPLANT
MANIFOLD NEPTUNE II (INSTRUMENTS) ×2 IMPLANT
MBRN O SEALING YLW 17 FOR INST (MISCELLANEOUS) ×2
MEMBRANE SLNG YLW 17 FOR INST (MISCELLANEOUS) ×1 IMPLANT
MORCELLATOR COLLECT CONTAINER (MISCELLANEOUS) ×2
MORCELLATOR OVERFLOW FILTER (FILTER) ×2
MORCELLATOR ROTATION 4.75 335 (MISCELLANEOUS) ×2 IMPLANT
NDL SAFETY ECLIPSE 18X1.5 (NEEDLE) ×1 IMPLANT
NEEDLE HYPO 18GX1.5 SHARP (NEEDLE) ×1
PACK CYSTO AR (MISCELLANEOUS) ×2 IMPLANT
PAD ARMBOARD 7.5X6 YLW CONV (MISCELLANEOUS) ×2 IMPLANT
SET CYSTO W/LG BORE CLAMP LF (SET/KITS/TRAYS/PACK) ×2 IMPLANT
SET IRRIG Y TYPE TUR BLADDER L (SET/KITS/TRAYS/PACK) ×2 IMPLANT
SLEEVE PROTECTION STRL DISP (MISCELLANEOUS) ×4 IMPLANT
SURGILUBE 2OZ TUBE FLIPTOP (MISCELLANEOUS) ×2 IMPLANT
SYR TOOMEY IRRIG 70ML (MISCELLANEOUS) ×2
SYRINGE TOOMEY IRRIG 70ML (MISCELLANEOUS) ×1 IMPLANT
TUBE PUMP MORCELLATOR PIRANHA (TUBING) ×2 IMPLANT
WATER STERILE IRR 1000ML POUR (IV SOLUTION) ×2 IMPLANT
WATER STERILE IRR 500ML POUR (IV SOLUTION) ×2 IMPLANT

## 2021-04-24 NOTE — Anesthesia Procedure Notes (Signed)
Procedure Name: Intubation Date/Time: 04/24/2021 8:44 AM Performed by: Aline Brochure, CRNA Pre-anesthesia Checklist: Patient identified, Patient being monitored, Timeout performed, Emergency Drugs available and Suction available Patient Re-evaluated:Patient Re-evaluated prior to induction Oxygen Delivery Method: Circle system utilized Preoxygenation: Pre-oxygenation with 100% oxygen Induction Type: IV induction Ventilation: Mask ventilation without difficulty Laryngoscope Size: McGraph and 4 Grade View: Grade I Tube type: Oral Tube size: 7.5 mm Number of attempts: 1 Airway Equipment and Method: Stylet Placement Confirmation: ETT inserted through vocal cords under direct vision, positive ETCO2 and breath sounds checked- equal and bilateral Secured at: 23 cm Tube secured with: Tape Dental Injury: Teeth and Oropharynx as per pre-operative assessment

## 2021-04-24 NOTE — Anesthesia Preprocedure Evaluation (Signed)
Anesthesia Evaluation  Patient identified by MRN, date of birth, ID band Patient awake    Reviewed: Allergy & Precautions, NPO status , Patient's Chart, lab work & pertinent test results  History of Anesthesia Complications Negative for: history of anesthetic complications  Airway Mallampati: III       Dental  (+) Teeth Intact, Caps, Dental Advidsory Given   Pulmonary neg pulmonary ROS, former smoker,    Pulmonary exam normal        Cardiovascular Exercise Tolerance: Good hypertension (h/o, but not currently), (-) angina+ CAD  (-) Past MI and (-) Cardiac Stents Normal cardiovascular exam(-) dysrhythmias (-) Valvular Problems/Murmurs     Neuro/Psych negative neurological ROS  negative psych ROS   GI/Hepatic Neg liver ROS, GERD  ,  Endo/Other  negative endocrine ROS  Renal/GU negative Renal ROS  negative genitourinary   Musculoskeletal negative musculoskeletal ROS (+)   Abdominal Normal abdominal exam  (+)   Peds negative pediatric ROS (+)  Hematology negative hematology ROS (+)   Anesthesia Other Findings Past Medical History: No date: Anemia, unspecified No date: BPH (benign prostatic hyperplasia) No date: GERD (gastroesophageal reflux disease) No date: Hypercholesterolemia No date: Hyperglycemia No date: Hypertension No date: Thrombocytopenia (HCC) No date: Thyroid nodule   Reproductive/Obstetrics negative OB ROS                             Anesthesia Physical  Anesthesia Plan  ASA: 2  Anesthesia Plan: General   Post-op Pain Management:    Induction: Intravenous  PONV Risk Score and Plan: 2 and Ondansetron  Airway Management Planned: Oral ETT  Additional Equipment:   Intra-op Plan:   Post-operative Plan: Extubation in OR  Informed Consent: I have reviewed the patients History and Physical, chart, labs and discussed the procedure including the risks, benefits and  alternatives for the proposed anesthesia with the patient or authorized representative who has indicated his/her understanding and acceptance.       Plan Discussed with: CRNA  Anesthesia Plan Comments:        Anesthesia Quick Evaluation

## 2021-04-24 NOTE — H&P (Signed)
04/24/21 8:33 AM   [place] 03-May-1945 [id number]  CC: BPH, bladder tumor  HPI: 76 year old male with worsening urinary symptoms of straining with urination, frequency, and feeling of incomplete emptying despite finasteride long-term.  PSAs have been normal.   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   PMH: Past Medical History:  Diagnosis Date   Anemia, unspecified    BPH (benign prostatic hyperplasia)    GERD (gastroesophageal reflux disease)    Hypercholesterolemia    Hyperglycemia    Hypertension    Thrombocytopenia (HCC)    Thyroid nodule     Surgical History: Past Surgical History:  Procedure Laterality Date   COLONOSCOPY WITH PROPOFOL N/A 10/02/2014   Procedure: COLONOSCOPY WITH PROPOFOL;  Surgeon: Manya Silvas, MD;  Location: [facility] ENDOSCOPY;  Service: Endoscopy;  Laterality: N/A;   INGUINAL HERNIA REPAIR     TONSILLECTOMY      Family History: Family History  Problem Relation Age of Onset   Prostate cancer Father    Heart disease Father        s/p CABG   Stroke Mother    Colon cancer Neg Hx     Social History:  reports that he quit smoking about 23 years ago. His smoking use included cigarettes. He has a 30.00 pack-year smoking history. He has never used smokeless tobacco. He reports current alcohol use of about 1.0 standard drink per week. He reports that he does not use drugs.  Physical Exam: BP (!) 127/98    Pulse 63    Temp 98.1 F (36.7 C) (Oral)    Resp 20    Ht 5\' 11"  (1.803 m)    Wt 80.7 kg    SpO2 99%    BMI 24.83 kg/m    Constitutional:  Alert and oriented, No acute distress. Cardiovascular: RRR Respiratory: CTA bilaterally GI: Abdomen is soft, nontender, nondistended, no abdominal masses   Laboratory Data: Culture 1/5 no growth  Assessment & Plan:   We  discussed transurethral resection of bladder tumor (TURBT) and risks and benefits at length. This is typically a 1 to 2-hour procedure done under general anesthesia in the operating room.  A scope is inserted through the urethra and used to resect abnormal tissue within the bladder, which is then sent to the pathologist to determine grade and stage of the tumor.  Risks include bleeding, infection, need for temporary Foley placement, and bladder perforation.  Treatment strategies are based on the type of tumor and depth of invasion.  We briefly reviewed the different treatment pathways for non-muscle invasive and muscle invasive bladder cancer.   Regarding his urinary symptoms, we discussed the options between UroLift and HOLEP.  With his large prostate and long-term symptoms, I think he would benefit more from a more definitive procedure with HOLEP. We discussed the risks and benefits of HoLEP at length.  The procedure requires general anesthesia and takes 1 to 2 hours, and a holmium laser is used to enucleate the prostate and push this tissue into the bladder.  A morcellator is then used to remove this tissue, which is sent for pathology.  The vast majority(>95%) of patients are able to discharge the same day with a catheter in place for 2 to 3 days, and will follow-up in clinic for a voiding trial.  We specifically discussed the risks of bleeding, infection,  retrograde ejaculation, temporary urgency and urge incontinence, very low risk of long-term incontinence, urethral stricture/bladder neck contracture, pathologic evaluation of prostate tissue and possible detection of prostate cancer or other malignancy, and possible need for additional procedures.  HOLEP and TURBT today   Nickolas Madrid, MD 04/24/2021  [facility] [address], [place],  [address] [phone]

## 2021-04-24 NOTE — Discharge Instructions (Signed)

## 2021-04-24 NOTE — Transfer of Care (Signed)
Immediate Anesthesia Transfer of Care Note  Patient: Nathan Watts  Procedure(s) Performed: HOLEP-LASER ENUCLEATION OF THE PROSTATE WITH MORCELLATION 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  Patient Location: PACU  Anesthesia Type:General  Level of Consciousness: awake, alert  and oriented  Airway & Oxygen Therapy: Patient Spontanous Breathing and Patient connected to face mask oxygen  Post-op Assessment: Report given to RN and Post -op Vital signs reviewed and stable  Post vital signs: Reviewed and stable  Last Vitals:  Vitals Value Taken Time  BP 136/75 04/24/21 1011  Temp    Pulse 51 04/24/21 1013  Resp 18 04/24/21 1013  SpO2 100 % 04/24/21 1013  Vitals shown include unvalidated device data.  Last Pain:  Vitals:   04/24/21 0737  TempSrc: Oral  PainSc: 0-No pain         Complications: No notable events documented.

## 2021-04-24 NOTE — Op Note (Signed)
Date of procedure: 04/24/21  Preoperative diagnosis:  Bladder tumor, 1 cm BPH with obstruction  Postoperative diagnosis:  Same  Procedure: HoLEP (Holmium Laser Enucleation of the Prostate) Bladder biopsy and fulguration(1 cm tumor)  Surgeon: Nickolas Madrid, MD  Anesthesia: General  Complications: None  Intraoperative findings:  Small 1 cm papillary tumor directly over the right ureteral orifice, removed with cold cup biopsy forcep and carefully fulgurated.  Good efflux from right ureteral orifice after removal Uncomplicated HOLEP  EBL: Minimal  Specimens:  1.  Bladder tumor 2.  Prostate chips  Enucleation time: 25 minutes  Morcellation time: 10 minutes  Intra-op weight: 43 g  Drains: 24 French three-way, 60 cc in balloon  Indication: Nathan Watts is a 76 y.o. patient with long history of BPH and obstructive urinary symptoms as well as found to have a very small 1 cm papillary tumor on cystoscopy.  After reviewing the management options for treatment, they elected to proceed with the above surgical procedure(s). We have discussed the potential benefits and risks of the procedure, side effects of the proposed treatment, the likelihood of the patient achieving the goals of the procedure, and any potential problems that might occur during the procedure or recuperation.  We specifically discussed the risks of bleeding, infection, hematuria and clot retention, need for additional procedures, possible overnight hospital stay, temporary urgency and incontinence, rare long-term incontinence, and retrograde ejaculation.  Informed consent has been obtained.   Description of procedure:  The patient was taken to the operating room and general anesthesia was induced.  The patient was placed in the dorsal lithotomy position, prepped and draped in the usual sterile fashion, and preoperative antibiotics(Ancef) were administered.  SCDs were placed for DVT prophylaxis.  A preoperative  time-out was performed.   Nathan Watts sounds were used to gently dilated the urethra up to 44F.   The rigid cystoscope was inserted into the urethra and a normal-appearing urethra was followed proximally to the bladder.  The prostate was moderate in size with a high bladder neck and obstructing lateral lobes.  Thorough cystoscopy showed mild to moderate trabeculations, and the ureteral orifices were orthotopic bilaterally.  There was a 1 cm papillary tumor directly over top of the right ureteral orifice.  No other bladder tumors seen.  Using the cold cup biopsy forceps this was carefully removed, and the Bugbee used for meticulous fulguration around the ureteral orifice for hemostasis.  There was excellent efflux from the right ureteral orifice after fulguration.  The 46 French continuous flow resectoscope was inserted into the urethra using the visual obturator.  The laser was set to 2 J and 60 Hz and was used to make a lambda incision just proximal to the verumontanum down to the level of the capsule.  A 6 o'clock incision was then made down to the level of the capsule from the bladder neck to the verumontanum.  The lateral lobes were then incised circumferentially until they were disconnected from the surrounding tissue.  The capsule was examined and laser was used for meticulous hemostasis.    The 26 French resectoscope was then switched out for the 17 French nephroscope and the lobes were morcellated and the tissue sent to pathology.  A 24 French three-way catheter was inserted easily with the aid of a catheter guide, and 60 cc were placed in the balloon.  Urine was pink.  The catheter irrigated easily with a Toomey syringe.  CBI was initiated. A belladonna suppository was placed.  The patient  tolerated the procedure well without any immediate complications and was extubated and transferred to the recovery room in stable condition.  Urine was light pink on fast CBI.  Disposition: Stable to  PACU  Plan: Wean CBI in PACU, anticipate discharge home today with Foley removal in clinic in 2-3 days We will call with pathology results from bladder lesion  Nickolas Madrid, MD 04/24/2021

## 2021-04-25 NOTE — Anesthesia Postprocedure Evaluation (Signed)
Anesthesia Post Note  Patient: Nathan Watts  Procedure(s) Performed: HOLEP-LASER ENUCLEATION OF THE PROSTATE WITH MORCELLATION 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  Patient location during evaluation: PACU Anesthesia Type: General Level of consciousness: awake and alert Pain management: pain level controlled Vital Signs Assessment: post-procedure vital signs reviewed and stable Respiratory status: spontaneous breathing, nonlabored ventilation, respiratory function stable and patient connected to nasal cannula oxygen Cardiovascular status: blood pressure returned to baseline and stable Postop Assessment: no apparent nausea or vomiting Anesthetic complications: no   No notable events documented.   Last Vitals:  Vitals:   04/24/21 1139 04/24/21 1215  BP: 140/77 137/82  Pulse: (!) 55 67  Resp: 14 16  Temp: (!) 36.1 C (!) 36 C  SpO2: 99% 100%    Last Pain:  Vitals:   04/24/21 1215  TempSrc: Temporal  PainSc: 0-No pain                 Martha Clan

## 2021-04-27 ENCOUNTER — Ambulatory Visit: Payer: Medicare Other | Admitting: *Deleted

## 2021-04-27 ENCOUNTER — Other Ambulatory Visit: Payer: Self-pay

## 2021-04-27 DIAGNOSIS — D494 Neoplasm of unspecified behavior of bladder: Secondary | ICD-10-CM

## 2021-04-27 LAB — SURGICAL PATHOLOGY

## 2021-04-27 NOTE — Progress Notes (Signed)
Catheter Removal  Patient is present today for a catheter removal.  61ml of water was drained from the balloon. A 24FR foley cath was removed from the bladder no complications were noted . Patient tolerated well.  Performed by: Gaspar Cola CMA   Follow up/ Additional notes: Patient to drink lots of water and call the office if he cant urinate

## 2021-04-29 ENCOUNTER — Ambulatory Visit: Payer: Medicare Other | Admitting: Gastroenterology

## 2021-04-30 ENCOUNTER — Telehealth: Payer: Self-pay | Admitting: *Deleted

## 2021-04-30 NOTE — Telephone Encounter (Signed)
Patient called Triage line saw results on MyChart for Biopsy, please advise.

## 2021-05-01 ENCOUNTER — Telehealth: Payer: Self-pay | Admitting: Urology

## 2021-05-01 NOTE — Telephone Encounter (Signed)
Urology telephone note  76 year old male who underwent HOLEP and bladder biopsy and fulguration of a small papillary 1 cm tumor overlying the right ureteral orifice on 04/24/2021.  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  We reviewed the AUA guidelines and treatment options including observation, repeat TURBT, BCG(on shortage), or other intravesical treatments.  With the very small lesion and superficial nature, I recommended surveillance cystoscopy at 3 months.  Risks and benefits discussed.  Will arrange clinic cystoscopy in 3 months with PVR  Nickolas Madrid, MD 05/01/2021

## 2021-06-12 ENCOUNTER — Encounter: Payer: Self-pay | Admitting: *Deleted

## 2021-06-15 ENCOUNTER — Ambulatory Visit: Payer: Medicare Other | Admitting: Anesthesiology

## 2021-06-15 ENCOUNTER — Other Ambulatory Visit: Payer: Self-pay

## 2021-06-15 ENCOUNTER — Encounter
Admission: RE | Disposition: A | Discharge status: Home / Self Care | Payer: Self-pay | Source: Home / Self Care | Attending: Gastroenterology

## 2021-06-15 ENCOUNTER — Ambulatory Visit
Admission: RE | Admit: 2021-06-15 | Discharge: 2021-06-15 | Disposition: A | Discharge status: Home / Self Care | Payer: Medicare Other | Attending: Gastroenterology | Admitting: Gastroenterology

## 2021-06-15 ENCOUNTER — Encounter: Payer: Self-pay | Admitting: *Deleted

## 2021-06-15 DIAGNOSIS — E7800 Pure hypercholesterolemia, unspecified: Secondary | ICD-10-CM | POA: Diagnosis not present

## 2021-06-15 DIAGNOSIS — K449 Diaphragmatic hernia without obstruction or gangrene: Secondary | ICD-10-CM | POA: Insufficient documentation

## 2021-06-15 DIAGNOSIS — Z1211 Encounter for screening for malignant neoplasm of colon: Secondary | ICD-10-CM | POA: Diagnosis not present

## 2021-06-15 DIAGNOSIS — R195 Other fecal abnormalities: Secondary | ICD-10-CM | POA: Diagnosis not present

## 2021-06-15 DIAGNOSIS — I10 Essential (primary) hypertension: Secondary | ICD-10-CM | POA: Insufficient documentation

## 2021-06-15 DIAGNOSIS — Z87891 Personal history of nicotine dependence: Secondary | ICD-10-CM | POA: Diagnosis not present

## 2021-06-15 DIAGNOSIS — K649 Unspecified hemorrhoids: Secondary | ICD-10-CM | POA: Diagnosis not present

## 2021-06-15 DIAGNOSIS — D175 Benign lipomatous neoplasm of intra-abdominal organs: Secondary | ICD-10-CM | POA: Insufficient documentation

## 2021-06-15 DIAGNOSIS — K219 Gastro-esophageal reflux disease without esophagitis: Secondary | ICD-10-CM | POA: Insufficient documentation

## 2021-06-15 DIAGNOSIS — Z79899 Other long term (current) drug therapy: Secondary | ICD-10-CM | POA: Diagnosis not present

## 2021-06-15 DIAGNOSIS — K640 First degree hemorrhoids: Secondary | ICD-10-CM | POA: Insufficient documentation

## 2021-06-15 DIAGNOSIS — K5730 Diverticulosis of large intestine without perforation or abscess without bleeding: Secondary | ICD-10-CM | POA: Diagnosis not present

## 2021-06-15 HISTORY — PX: COLONOSCOPY WITH PROPOFOL: SHX5780

## 2021-06-15 HISTORY — DX: Fatty (change of) liver, not elsewhere classified: K76.0

## 2021-06-15 HISTORY — PX: ESOPHAGOGASTRODUODENOSCOPY (EGD) WITH PROPOFOL: SHX5813

## 2021-06-15 SURGERY — COLONOSCOPY WITH PROPOFOL
Anesthesia: General

## 2021-06-15 MED ORDER — PROPOFOL 10 MG/ML IV BOLUS
INTRAVENOUS | Status: DC | PRN
  Administered 2021-06-15: 100 mg via INTRAVENOUS

## 2021-06-15 MED ORDER — LIDOCAINE HCL (CARDIAC) PF 100 MG/5ML IV SOSY
PREFILLED_SYRINGE | INTRAVENOUS | Status: DC | PRN
  Administered 2021-06-15: 100 mg via INTRAVENOUS

## 2021-06-15 MED ORDER — SODIUM CHLORIDE 0.9 % IV SOLN: INTRAVENOUS | Status: DC

## 2021-06-15 MED ORDER — LIDOCAINE HCL (PF) 2 % IJ SOLN
INTRAMUSCULAR | Status: AC
  Filled 2021-06-15: qty 5

## 2021-06-15 MED ORDER — PROPOFOL 500 MG/50ML IV EMUL
INTRAVENOUS | Status: AC
  Filled 2021-06-15: qty 50

## 2021-06-15 MED ORDER — PROPOFOL 500 MG/50ML IV EMUL
INTRAVENOUS | Status: DC | PRN
  Administered 2021-06-15: 175 ug/kg/min via INTRAVENOUS

## 2021-06-15 NOTE — Op Note (Signed)
[facility] ?Gastroenterology ?Patient Name: Nathan Watts ?Procedure Date: 06/15/2021 12:27 PM ?MRN: [id number] ?Account #: [id number] ?Date of Birth: [birth year] ?Admit Type: Outpatient ?Age: 76 ?Room: [facility] ENDO ROOM 3 ?Gender: Male ?Note Status: Finalized ?Instrument Name: Upper Endoscope [id number] ?Procedure:             Upper GI endoscopy ?Indications:           Gastro-esophageal reflux disease ?Providers:             Andrey Farmer MD, MD ?Referring MD:          Einar Pheasant, MD (Referring MD) ?Medicines:             Monitored Anesthesia Care ?Complications:         No immediate complications. ?Procedure:             Pre-Anesthesia Assessment: ?                       - Prior to the procedure, a History and Physical was  ?                       performed, and patient medications and allergies were  ?                       reviewed. The patient is competent. The risks and  ?                       benefits of the procedure and the sedation options and  ?                       risks were discussed with the patient. All questions  ?                       were answered and informed consent was obtained.  ?                       Patient identification and proposed procedure were  ?                       verified by the physician, the nurse, the  ?                       anesthesiologist, the anesthetist and the technician  ?                       in the endoscopy suite. Mental Status Examination:  ?                       alert and oriented. Airway Examination: normal  ?                       oropharyngeal airway and neck mobility. Respiratory  ?                       Examination: clear to auscultation. CV Examination:  ?                       normal. Prophylactic Antibiotics: The patient does not  ?  require prophylactic antibiotics. Prior  ?                       Anticoagulants: The patient has taken no previous  ?                       anticoagulant or antiplatelet agents. ASA  Grade  ?                       Assessment: II - A patient with mild systemic disease.  ?                       After reviewing the risks and benefits, the patient  ?                       was deemed in satisfactory condition to undergo the  ?                       procedure. The anesthesia plan was to use monitored  ?                       anesthesia care (MAC). Immediately prior to  ?                       administration of medications, the patient was  ?                       re-assessed for adequacy to receive sedatives. The  ?                       heart rate, respiratory rate, oxygen saturations,  ?                       blood pressure, adequacy of pulmonary ventilation, and  ?                       response to care were monitored throughout the  ?                       procedure. The physical status of the patient was  ?                       re-assessed after the procedure. ?                       After obtaining informed consent, the endoscope was  ?                       passed under direct vision. Throughout the procedure,  ?                       the patient's blood pressure, pulse, and oxygen  ?                       saturations were monitored continuously. The Endoscope  ?                       was introduced through the mouth, and advanced to the  ?  second part of duodenum. The upper GI endoscopy was  ?                       accomplished without difficulty. The patient tolerated  ?                       the procedure well. ?Findings: ?     A small hiatal hernia was present. ?     The exam of the esophagus was otherwise normal. ?     The entire examined stomach was normal. ?     The examined duodenum was normal. ?Impression:            - Small hiatal hernia. ?                       - Normal stomach. ?                       - Normal examined duodenum. ?                       - No specimens collected. ?Recommendation:        - Discharge patient to home. ?                       -  Resume previous diet. ?                       - Continue present medications. ?                       - Return to referring physician as previously  ?                       scheduled. ?Procedure Code(s):     --- Professional --- ?                       [phone], Esophagogastroduodenoscopy, flexible,  ?                       transoral; diagnostic, including collection of  ?                       specimen(s) by brushing or washing, when performed  ?                       (separate procedure) ?Diagnosis Code(s):     --- Professional --- ?                       K44.9, Diaphragmatic hernia without obstruction or  ?                       gangrene ?                       K21.9, Gastro-esophageal reflux disease without  ?                       esophagitis ?CPT copyright 2019 [employer]. All rights reserved. ?The codes documented in this report are preliminary and upon coder review may  ?be revised to meet current compliance requirements. ?Andrey Farmer  MD, MD ?06/15/2021 1:14:04 PM ?Number of Addenda: 0 ?Note Initiated On: 06/15/2021 12:27 PM ?Estimated Blood Loss:  Estimated blood loss: none. ?     [facility] ?

## 2021-06-15 NOTE — Op Note (Signed)
[facility] ?Gastroenterology ?Patient Name: Nathan Watts ?Procedure Date: 06/15/2021 12:26 PM ?MRN: [id number] ?Account #: [id number] ?Date of Birth: 21-Oct-1945 ?Admit Type: Outpatient ?Age: 76 ?Room: [facility] ENDO ROOM 3 ?Gender: Male ?Note Status: Finalized ?Instrument Name: Colonscope [id number] ?Procedure:             Colonoscopy ?Indications:           Heme positive stool ?Providers:             Andrey Farmer MD, MD ?Referring MD:          Einar Pheasant, MD (Referring MD) ?Medicines:             Monitored Anesthesia Care ?Complications:         No immediate complications. ?Procedure:             Pre-Anesthesia Assessment: ?                       - Prior to the procedure, a History and Physical was  ?                       performed, and patient medications and allergies were  ?                       reviewed. The patient is competent. The risks and  ?                       benefits of the procedure and the sedation options and  ?                       risks were discussed with the patient. All questions  ?                       were answered and informed consent was obtained.  ?                       Patient identification and proposed procedure were  ?                       verified by the physician, the nurse, the  ?                       anesthesiologist, the anesthetist and the technician  ?                       in the endoscopy suite. Mental Status Examination:  ?                       alert and oriented. Airway Examination: normal  ?                       oropharyngeal airway and neck mobility. Respiratory  ?                       Examination: clear to auscultation. CV Examination:  ?                       normal. Prophylactic Antibiotics: The patient does not  ?  require prophylactic antibiotics. Prior  ?                       Anticoagulants: The patient has taken no previous  ?                       anticoagulant or antiplatelet agents. ASA Grade  ?                        Assessment: II - A patient with mild systemic disease.  ?                       After reviewing the risks and benefits, the patient  ?                       was deemed in satisfactory condition to undergo the  ?                       procedure. The anesthesia plan was to use monitored  ?                       anesthesia care (MAC). Immediately prior to  ?                       administration of medications, the patient was  ?                       re-assessed for adequacy to receive sedatives. The  ?                       heart rate, respiratory rate, oxygen saturations,  ?                       blood pressure, adequacy of pulmonary ventilation, and  ?                       response to care were monitored throughout the  ?                       procedure. The physical status of the patient was  ?                       re-assessed after the procedure. ?                       After obtaining informed consent, the colonoscope was  ?                       passed under direct vision. Throughout the procedure,  ?                       the patient's blood pressure, pulse, and oxygen  ?                       saturations were monitored continuously. The  ?                       Colonoscope was introduced through the anus and  ?  advanced to the the terminal ileum. The colonoscopy  ?                       was performed without difficulty. The patient  ?                       tolerated the procedure well. The quality of the bowel  ?                       preparation was good. ?Findings: ?     The perianal and digital rectal examinations were normal. ?     The terminal ileum appeared normal. ?     There was a small lipoma, in the ascending colon. ?     A few small-mouthed diverticula were found in the sigmoid colon and  ?     descending colon. ?     Internal hemorrhoids were found during retroflexion. The hemorrhoids  ?     were Grade I (internal hemorrhoids that do not prolapse). ?     The exam was  otherwise without abnormality on direct and retroflexion  ?     views. ?Impression:            - The examined portion of the ileum was normal. ?                       - Small lipoma in the ascending colon. ?                       - Diverticulosis in the sigmoid colon and in the  ?                       descending colon. ?                       - Internal hemorrhoids. ?                       - The examination was otherwise normal on direct and  ?                       retroflexion views. ?                       - No specimens collected. ?Recommendation:        - Discharge patient to home. ?                       - Resume previous diet. ?                       - Continue present medications. ?                       - Repeat colonoscopy is not recommended due to current  ?                       age (65 years or older) for screening purposes. ?                       - Return to referring physician as previously  ?  scheduled. ?Procedure Code(s):     --- Professional --- ?                       [phone], Colonoscopy, flexible; diagnostic, including  ?                       collection of specimen(s) by brushing or washing, when  ?                       performed (separate procedure) ?Diagnosis Code(s):     --- Professional --- ?                       K64.0, First degree hemorrhoids ?                       D17.5, Benign lipomatous neoplasm of intra-abdominal  ?                       organs ?                       R19.5, Other fecal abnormalities ?                       K57.30, Diverticulosis of large intestine without  ?                       perforation or abscess without bleeding ?CPT copyright 2019 American Medical Association. All rights reserved. ?The codes documented in this report are preliminary and upon coder review may  ?be revised to meet current compliance requirements. ?Andrey Farmer MD, MD ?06/15/2021 1:18:55 PM ?Number of Addenda: 0 ?Note Initiated On: 06/15/2021 12:26 PM ?Scope Withdrawal  Time: 0 hours 14 minutes 56 seconds  ?Total Procedure Duration: 0 hours 21 minutes 6 seconds  ?Estimated Blood Loss:  Estimated blood loss: none. ?     [facility] ?

## 2021-06-15 NOTE — Anesthesia Preprocedure Evaluation (Signed)
Anesthesia Evaluation  ?Patient identified by MRN, date of birth, ID band ?Patient awake ? ? ? ?Reviewed: ?Allergy & Precautions, H&P , NPO status , Patient's Chart, lab work & pertinent test results, reviewed documented beta blocker date and time  ? ?Airway ?Mallampati: II ? ? ?Neck ROM: full ? ? ? Dental ? ?(+) Poor Dentition ?  ?Pulmonary ?neg pulmonary ROS, former smoker,  ?  ?Pulmonary exam normal ? ? ? ? ? ? ? Cardiovascular ?Exercise Tolerance: Good ?hypertension, On Medications ?(-) angina+ CAD  ?Normal cardiovascular exam ?Rhythm:regular Rate:Normal ? ? ?  ?Neuro/Psych ?negative neurological ROS ? negative psych ROS  ? GI/Hepatic ?Neg liver ROS, GERD  Medicated,  ?Endo/Other  ?negative endocrine ROS ? Renal/GU ?negative Renal ROS  ?negative genitourinary ?  ?Musculoskeletal ? ? Abdominal ?  ?Peds ? Hematology ? ?(+) Blood dyscrasia, anemia ,   ?Anesthesia Other Findings ?Past Medical History: ?No date: Anemia, unspecified ?No date: BPH (benign prostatic hyperplasia) ?No date: Fatty infiltration of liver ?No date: GERD (gastroesophageal reflux disease) ?No date: Hypercholesterolemia ?No date: Hyperglycemia ?No date: Hypertension ?No date: Thrombocytopenia ([place]) ?No date: Thyroid nodule ?Past Surgical History: ?10/02/2014: COLONOSCOPY WITH PROPOFOL; N/A ?    Comment:  Procedure: COLONOSCOPY WITH PROPOFOL;  Surgeon: Herbie Baltimore T ?             Vira Agar, MD;  Location: [place];  Service:  ?             Endoscopy;  Laterality: N/A; ?04/24/2021: HOLEP-LASER ENUCLEATION OF THE PROSTATE WITH MORCELLATION;  ?N/A ?    Comment:  Procedure: HOLEP-LASER ENUCLEATION OF THE PROSTATE WITH  ?             MORCELLATION;  Surgeon: Billey Co, MD;  Location:  ?             [facility];  Service: Urology;  Laterality: N/A; ?No date: INGUINAL HERNIA REPAIR ?No date: TONSILLECTOMY ?04/24/2021: TRANSURETHRAL RESECTION OF BLADDER TUMOR WITH MITOMYCIN-C;  ?N/A ?    Comment:  Procedure: TRANSURETHRAL  RESECTION OF BLADDER TUMOR WITH ?             gemcitabine;  Surgeon: Billey Co, MD;  Location:  ?             [facility];  Service: Urology;  Laterality: N/A; ?BMI   ? Body Mass Index: 24.41 kg/m?  ?  ? Reproductive/Obstetrics ?negative OB ROS ? ?  ? ? ? ? ? ? ? ? ? ? ? ? ? ?  ?  ? ? ? ? ? ? ? ? ?Anesthesia Physical ?Anesthesia Plan ? ?ASA: 3 ? ?Anesthesia Plan: General  ? ?Post-op Pain Management:   ? ?Induction:  ? ?PONV Risk Score and Plan:  ? ?Airway Management Planned:  ? ?Additional Equipment:  ? ?Intra-op Plan:  ? ?Post-operative Plan:  ? ?Informed Consent: I have reviewed the patients History and Physical, chart, labs and discussed the procedure including the risks, benefits and alternatives for the proposed anesthesia with the patient or authorized representative who has indicated his/her understanding and acceptance.  ? ? ? ?Dental Advisory Given ? ?Plan Discussed with: CRNA ? ?Anesthesia Plan Comments:   ? ? ? ? ? ? ?Anesthesia Quick Evaluation ? ?

## 2021-06-15 NOTE — Interval H&P Note (Signed)
History and Physical Interval Note: ? ?06/15/2021 ?12:26 PM ? ?Nathan Watts  has presented today for surgery, with the diagnosis of Occult blood positive stool R19.5 ?GERD K21.9.  The various methods of treatment have been discussed with the patient and family. After consideration of risks, benefits and other options for treatment, the patient has consented to  Procedure(s): ?COLONOSCOPY WITH PROPOFOL (N/A) ?ESOPHAGOGASTRODUODENOSCOPY (EGD) WITH PROPOFOL (N/A) as a surgical intervention.  The patient's history has been reviewed, patient examined, no change in status, stable for surgery.  I have reviewed the patient's chart and labs.  Questions were answered to the patient's satisfaction.   ? ? ?Hilton Cork Tranise Forrest ? ?Ok to proceed with colonoscopy ?

## 2021-06-15 NOTE — Transfer of Care (Signed)
Immediate Anesthesia Transfer of Care Note ? ?Patient: Nathan Watts ? ?Procedure(s) Performed: COLONOSCOPY WITH PROPOFOL ?ESOPHAGOGASTRODUODENOSCOPY (EGD) WITH PROPOFOL ? ?Patient Location: Endoscopy Unit ? ?Anesthesia Type:General ? ?Level of Consciousness: awake ? ?Airway & Oxygen Therapy: Patient Spontanous Breathing ? ?Post-op Assessment: Report given to RN and Post -op Vital signs reviewed and stable ? ?Post vital signs: Reviewed and stable ? ?Last Vitals:  ?Vitals Value Taken Time  ?BP    ?Temp    ?Pulse 52 06/15/21 1313  ?Resp 13 06/15/21 1313  ?SpO2 100 % 06/15/21 1313  ? ? ?Last Pain:  ?Vitals:  ? 06/15/21 1142  ?TempSrc: Temporal  ?PainSc: 0-No pain  ?   ? ?  ? ?Complications: No notable events documented. ?

## 2021-06-15 NOTE — H&P (Signed)
Outpatient short stay form Pre-procedure ?06/15/2021  ?Lesly Rubenstein, MD ? ?Primary Physician: Einar Pheasant, MD ? ?Reason for visit:  GERD/Occult blood positive ? ?History of present illness:   ? ?76 y/o gentleman with history of HLD here for EGD for GERD and colonoscopy for occult blood. No family history of GI malignancies. No blood thinners. No significant abdominal surgeries. ? ? ? ?Current Facility-Administered Medications:  ?  0.9 %  sodium chloride infusion, , Intravenous, Continuous, Camellia Popescu, Hilton Cork, MD, Last Rate: 20 mL/hr at 06/15/21 1212, New Bag at 06/15/21 1212 ? ?Medications Prior to Admission  ?Medication Sig Dispense Refill Last Dose  ? aspirin 81 MG tablet Take 81 mg by mouth daily.   Past Week  ? atorvastatin (LIPITOR) 20 MG tablet TAKE ONE TABLET BY MOUTH DAILY 90 tablet 2 06/14/2021  ? Fiber POWD Take 15 mLs by mouth daily.   06/14/2021  ? Lactobacillus (PROBIOTIC ACIDOPHILUS PO) Take by mouth daily.   Past Week  ? metroNIDAZOLE (METROGEL) 0.75 % gel Apply 1 application topically 2 (two) times a week. to face   06/14/2021  ? Multiple Vitamin (MULTI VITAMIN MENS PO) Take 1 tablet by mouth daily.   Past Week  ? Omega-3 Fatty Acids (FISH OIL) 1200 MG CAPS Take 1 capsule by mouth daily.   Past Week  ? Omeprazole Magnesium (PRILOSEC OTC PO) Take by mouth daily.   06/14/2021  ? pimecrolimus (ELIDEL) 1 % cream 1 application 3 (three) times a week.   06/14/2021  ? vitamin B-12 (CYANOCOBALAMIN) 500 MCG tablet Take 500 mcg by mouth daily.   Past Week  ? finasteride (PROSCAR) 5 MG tablet Take 5 mg by mouth daily. (Patient not taking: Reported on 06/15/2021)   Not Taking  ? fluticasone (FLONASE) 50 MCG/ACT nasal spray Place 2 sprays into both nostrils daily. (Patient not taking: Reported on 04/14/2021) 16 g 0   ? ? ? ?Allergies  ?Allergen Reactions  ? Crestor [Rosuvastatin] Other (See Comments)  ?  Causes GI upset and anxiety  ? Hydrocodone-Chlorpheniramine Other (See Comments)  ?  Unknown, possible causes  anxiety ? possible causes anxiety ? ?Did ok with low dose  ? ? ? ?Past Medical History:  ?Diagnosis Date  ? Anemia, unspecified   ? BPH (benign prostatic hyperplasia)   ? Fatty infiltration of liver   ? GERD (gastroesophageal reflux disease)   ? Hypercholesterolemia   ? Hyperglycemia   ? Hypertension   ? Thrombocytopenia ([place])   ? Thyroid nodule   ? ? ?Review of systems:  Otherwise negative.  ? ? ?Physical Exam ? ?Gen: Alert, oriented. Appears stated age.  ?HEENT: PERRLA. ?Lungs: No respiratory distress ?CV: RRR ?Abd: soft, benign, no masses ?Ext: No edema ? ? ? ?Planned procedures: Proceed with EGD/colonoscopy. The patient understands the nature of the planned procedure, indications, risks, alternatives and potential complications including but not limited to bleeding, infection, perforation, damage to internal organs and possible oversedation/side effects from anesthesia. The patient agrees and gives consent to proceed.  ?Please refer to procedure notes for findings, recommendations and patient disposition/instructions.  ? ? ? ?Lesly Rubenstein, MD ?[facility] ? ? ? ?  ? ?

## 2021-06-16 ENCOUNTER — Encounter: Payer: Self-pay | Admitting: Gastroenterology

## 2021-06-16 NOTE — Anesthesia Postprocedure Evaluation (Signed)
Anesthesia Post Note ? ?Patient: Nathan Watts ? ?Procedure(s) Performed: COLONOSCOPY WITH PROPOFOL ?ESOPHAGOGASTRODUODENOSCOPY (EGD) WITH PROPOFOL ? ?Patient location during evaluation: PACU ?Anesthesia Type: General ?Level of consciousness: awake and alert ?Pain management: pain level controlled ?Vital Signs Assessment: post-procedure vital signs reviewed and stable ?Respiratory status: spontaneous breathing, nonlabored ventilation, respiratory function stable and patient connected to nasal cannula oxygen ?Cardiovascular status: blood pressure returned to baseline and stable ?Postop Assessment: no apparent nausea or vomiting ?Anesthetic complications: no ? ? ?No notable events documented. ? ? ?Last Vitals:  ?Vitals:  ? 06/15/21 1323 06/15/21 1333  ?BP: 102/77 112/76  ?Pulse: 67 (!) 46  ?Resp: 13 12  ?Temp:    ?SpO2: 99% 100%  ?  ?Last Pain:  ?Vitals:  ? 06/15/21 1333  ?TempSrc:   ?PainSc: 0-No pain  ? ? ?  ?  ?  ?  ?  ?  ? ?Molli Barrows ? ? ? ? ?

## 2021-06-26 DIAGNOSIS — Z85828 Personal history of other malignant neoplasm of skin: Secondary | ICD-10-CM | POA: Diagnosis not present

## 2021-06-26 DIAGNOSIS — L718 Other rosacea: Secondary | ICD-10-CM | POA: Diagnosis not present

## 2021-06-26 DIAGNOSIS — L853 Xerosis cutis: Secondary | ICD-10-CM | POA: Diagnosis not present

## 2021-06-26 DIAGNOSIS — D2262 Melanocytic nevi of left upper limb, including shoulder: Secondary | ICD-10-CM | POA: Diagnosis not present

## 2021-06-30 DIAGNOSIS — M84374A Stress fracture, right foot, initial encounter for fracture: Secondary | ICD-10-CM | POA: Diagnosis not present

## 2021-06-30 DIAGNOSIS — M79671 Pain in right foot: Secondary | ICD-10-CM | POA: Diagnosis not present

## 2021-07-29 ENCOUNTER — Other Ambulatory Visit: Payer: Medicare Other | Admitting: Urology

## 2021-07-30 ENCOUNTER — Ambulatory Visit: Payer: Medicare Other | Admitting: Urology

## 2021-07-30 DIAGNOSIS — Z8551 Personal history of malignant neoplasm of bladder: Secondary | ICD-10-CM | POA: Diagnosis not present

## 2021-07-30 DIAGNOSIS — N138 Other obstructive and reflux uropathy: Secondary | ICD-10-CM | POA: Diagnosis not present

## 2021-07-30 DIAGNOSIS — C670 Malignant neoplasm of trigone of bladder: Secondary | ICD-10-CM

## 2021-07-30 DIAGNOSIS — N401 Enlarged prostate with lower urinary tract symptoms: Secondary | ICD-10-CM | POA: Diagnosis not present

## 2021-07-30 LAB — URINALYSIS, COMPLETE
Bilirubin, UA: NEGATIVE
Glucose, UA: NEGATIVE
Ketones, UA: NEGATIVE
Leukocytes,UA: NEGATIVE
Nitrite, UA: NEGATIVE
Protein,UA: NEGATIVE
Specific Gravity, UA: 1.015 (ref 1.005–1.030)
Urobilinogen, Ur: 0.2 mg/dL (ref 0.2–1.0)
pH, UA: 5.5 (ref 5.0–7.5)

## 2021-07-30 LAB — MICROSCOPIC EXAMINATION
Bacteria, UA: NONE SEEN
Epithelial Cells (non renal): NONE SEEN /hpf (ref 0–10)

## 2021-07-30 NOTE — Progress Notes (Signed)
Cystoscopy Procedure Note: ? ?Indication: Bladder cancer surveillance ? ?04/24/2021: Biopsy and fulguration of 1 cm tumor adjacent to the right ureteral orifice, pathology HG Ta.  Simultaneous HOLEP ? ?After informed consent and discussion of the procedure and its risks, Nathan Watts was positioned and prepped in the standard fashion. Cystoscopy was performed with a flexible cystoscope. The urethra, bladder neck and entire bladder was visualized in a standard fashion.  Wide open prostatic fossa after HOLEP.  The ureteral orifices were visualized in their normal location and orientation.  Moderate bladder trabeculations, no suspicious lesions, no abnormalities on retroflexion. ? ? ?Findings: ?No evidence of recurrence ? ?Assessment and Plan: ?-Using shared decision making, he opted for follow-up cystoscopy in 4 months.  If no recurrence at that time likely can start spacing to every 6 months ?-Recovering well from HOLEP appropriately, PVR normal at 80 mL today, voiding with an excellent stream, still having some occasional urge incontinence, instructed on Kegel exercises ? ? ?Nickolas Madrid, MD ?07/30/2021  ? ?

## 2021-07-30 NOTE — Patient Instructions (Signed)
Kegel Exercises  Kegel exercises can help strengthen your pelvic floor muscles. The pelvic floor is a group of muscles that support your rectum, small intestine, and bladder. In females, pelvic floor muscles also help support the uterus. These muscles help you control the flow of urine and stool (feces). Kegel exercises are painless and simple. They do not require any equipment. Your provider may suggest Kegel exercises to: Improve bladder and bowel control. Improve sexual response. Improve weak pelvic floor muscles after surgery to remove the uterus (hysterectomy) or after pregnancy, in females. Improve weak pelvic floor muscles after prostate gland removal or surgery, in males. Kegel exercises involve squeezing your pelvic floor muscles. These are the same muscles you squeeze when you try to stop the flow of urine or keep from passing gas. The exercises can be done while sitting, standing, or lying down, but it is best to vary your position. Ask your health care provider which exercises are safe for you. Do exercises exactly as told by your health care provider and adjust them as directed. Do not begin these exercises until told by your health care provider. Exercises How to do Kegel exercises: Squeeze your pelvic floor muscles tight. You should feel a tight lift in your rectal area. If you are a male, you should also feel a tightness in your vaginal area. Keep your stomach, buttocks, and legs relaxed. Hold the muscles tight for up to 10 seconds. Breathe normally. Relax your muscles for up to 10 seconds. Repeat as told by your health care provider. Repeat this exercise daily as told by your health care provider. Continue to do this exercise for at least 4-6 weeks, or for as long as told by your health care provider. You may be referred to a physical therapist who can help you learn more about how to do Kegel exercises. Depending on your condition, your health care provider may  recommend: Varying how long you squeeze your muscles. Doing several sets of exercises every day. Doing exercises for several weeks. Making Kegel exercises a part of your regular exercise routine. This information is not intended to replace advice given to you by your health care provider. Make sure you discuss any questions you have with your health care provider. Document Revised: 08/07/2020 Document Reviewed: 08/07/2020 Elsevier Patient Education  2023 [employer]  

## 2021-08-05 ENCOUNTER — Telehealth: Payer: Self-pay | Admitting: Internal Medicine

## 2021-08-05 NOTE — Telephone Encounter (Signed)
Copied from [place] #[id number]. Topic: Medicare AWV ?>> Aug 05, 2021 11:32 AM Harris-Coley, Hannah Beat wrote: ?Reason for CRM: Left message for patient to schedule Annual Wellness Visit.  Please schedule with Nurse Health Advisor Denisa O'Brien-Blaney, LPN at [facility].  Please call [phone] ask for Juliann Pulse ?

## 2021-09-01 ENCOUNTER — Ambulatory Visit (INDEPENDENT_AMBULATORY_CARE_PROVIDER_SITE_OTHER): Payer: Medicare Other | Admitting: Internal Medicine

## 2021-09-01 ENCOUNTER — Encounter: Payer: Self-pay | Admitting: Internal Medicine

## 2021-09-01 VITALS — BP 130/70 | HR 62 | Temp 98.3°F | Resp 14 | Ht 71.0 in | Wt 179.2 lb

## 2021-09-01 DIAGNOSIS — R739 Hyperglycemia, unspecified: Secondary | ICD-10-CM | POA: Diagnosis not present

## 2021-09-01 DIAGNOSIS — I700 Atherosclerosis of aorta: Secondary | ICD-10-CM

## 2021-09-01 DIAGNOSIS — I10 Essential (primary) hypertension: Secondary | ICD-10-CM | POA: Diagnosis not present

## 2021-09-01 DIAGNOSIS — I2510 Atherosclerotic heart disease of native coronary artery without angina pectoris: Secondary | ICD-10-CM

## 2021-09-01 DIAGNOSIS — E041 Nontoxic single thyroid nodule: Secondary | ICD-10-CM

## 2021-09-01 DIAGNOSIS — E7800 Pure hypercholesterolemia, unspecified: Secondary | ICD-10-CM

## 2021-09-01 DIAGNOSIS — I723 Aneurysm of iliac artery: Secondary | ICD-10-CM

## 2021-09-01 DIAGNOSIS — C679 Malignant neoplasm of bladder, unspecified: Secondary | ICD-10-CM

## 2021-09-01 LAB — HEMOGLOBIN A1C: Hgb A1c MFr Bld: 5.8 % (ref 4.6–6.5)

## 2021-09-01 LAB — BASIC METABOLIC PANEL
BUN: 15 mg/dL (ref 6–23)
CO2: 31 mEq/L (ref 19–32)
Calcium: 9.1 mg/dL (ref 8.4–10.5)
Chloride: 104 mEq/L (ref 96–112)
Creatinine, Ser: 1.03 mg/dL (ref 0.40–1.50)
GFR: 70.33 mL/min (ref >60.00)
Glucose, Bld: 100 mg/dL — ABNORMAL HIGH (ref 70–99)
Potassium: 4.4 mEq/L (ref 3.5–5.1)
Sodium: 140 mEq/L (ref 135–145)

## 2021-09-01 LAB — TSH: TSH: 1.21 u[IU]/mL (ref 0.35–5.50)

## 2021-09-01 LAB — LIPID PANEL
Cholesterol: 133 mg/dL (ref 0–200)
HDL: 44.9 mg/dL (ref >39.00)
LDL Cholesterol: 75 mg/dL (ref 0–99)
NonHDL: 87.73
Total CHOL/HDL Ratio: 3
Triglycerides: 64 mg/dL (ref 0.0–149.0)
VLDL: 12.8 mg/dL (ref 0.0–40.0)

## 2021-09-01 LAB — HEPATIC FUNCTION PANEL
ALT: 14 U/L (ref 0–53)
AST: 17 U/L (ref 0–37)
Albumin: 4.2 g/dL (ref 3.5–5.2)
Alkaline Phosphatase: 58 U/L (ref 39–117)
Bilirubin, Direct: 0.1 mg/dL (ref 0.0–0.3)
Total Bilirubin: 0.6 mg/dL (ref 0.2–1.2)
Total Protein: 6.8 g/dL (ref 6.0–8.3)

## 2021-09-01 LAB — T4, FREE: Free T4: 0.8 ng/dL (ref 0.60–1.60)

## 2021-09-01 NOTE — Progress Notes (Signed)
Patient ID: Nathan Watts, male   DOB: December 04, 1945, 76 y.o.   MRN: [id number]   Subjective:    Patient ID: Nathan Watts, male    DOB: [birth year], 76 y.o.   MRN: [id number]   Patient here for a scheduled follow up.   Chief Complaint  Patient presents with   Follow-up    52mof/u   Hypertension   Hyperlipidemia   .   HPI He is doing well.  Recently diagnosed with bladder cancer.  Seeing Dr SDiamantina Providence  Doing well.  Urinating ok.  Had recent colonoscopy.  Bowels ok.  Getting back into his walking.  No chest pain or sob reported.  No abdominal pain.  Handling stress. Overall he feels he is doing relatively well.    Past Medical History:  Diagnosis Date   Anemia, unspecified    BPH (benign prostatic hyperplasia)    Fatty infiltration of liver    GERD (gastroesophageal reflux disease)    Hypercholesterolemia    Hyperglycemia    Hypertension    Thrombocytopenia (HCC)    Thyroid nodule    Past Surgical History:  Procedure Laterality Date   COLONOSCOPY WITH PROPOFOL N/A 10/02/2014   Procedure: COLONOSCOPY WITH PROPOFOL;  Surgeon: RManya Silvas MD;  [place]  Service: Endoscopy;  Laterality: N/A;   COLONOSCOPY WITH PROPOFOL N/A 06/15/2021   Procedure: COLONOSCOPY WITH PROPOFOL;  Surgeon: LLesly Rubenstein MD;  Location: [facility];  Service: Endoscopy;  Laterality: N/A;   ESOPHAGOGASTRODUODENOSCOPY (EGD) WITH PROPOFOL N/A 06/15/2021   Procedure: ESOPHAGOGASTRODUODENOSCOPY (EGD) WITH PROPOFOL;  Surgeon: LLesly Rubenstein MD;  Location: [facility];  Service: Endoscopy;  Laterality: N/A;   HOLEP-LASER ENUCLEATION OF THE PROSTATE WITH MORCELLATION N/A 04/24/2021   Procedure: HOLEP-LASER ENUCLEATION OF THE PROSTATE WITH MORCELLATION;  Surgeon: SBilley Co MD;  Location: [facility];  Service: Urology;  Laterality: N/A;   INGUINAL HERNIA REPAIR     TONSILLECTOMY     TRANSURETHRAL RESECTION OF BLADDER TUMOR WITH MITOMYCIN-C N/A 04/24/2021   Procedure:  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;  Surgeon: SBilley Co MD;  Location: [facility];  Service: Urology;  Laterality: N/A;   Family History  Problem Relation Age of Onset   Prostate cancer Father    Heart disease Father        s/p CABG   Stroke Mother    Colon cancer Neg Hx    Social History   Socioeconomic History   Marital status: Married    Spouse name: Not on file   Number of children: 4   Years of education: Not on file   Highest education level: Not on file  Occupational History   Occupation: retired  Tobacco Use   Smoking status: Former    Packs/day: 1.00    Years: 30.00    Pack years: 30.00    Types: Cigarettes    Quit date: 04/12/1998    Years since quitting: 23.4   Smokeless tobacco: Never  Vaping Use   Vaping Use: Never used  Substance and Sexual Activity   Alcohol use: Not Currently    Alcohol/week: 1.0 standard drink    Types: 1 Cans of beer per week    Comment: OCC   Drug use: Never   Sexual activity: Never  Other Topics Concern   Not on file  Social History Narrative   He is married. Has four children   Social Determinants of Health   Financial Resource Strain: Not on file  Food Insecurity: Not on file  Transportation Needs: Not on file  Physical Activity: Not on file  Stress: Not on file  Social Connections: Not on file     Review of Systems  Constitutional:  Negative for appetite change and unexpected weight change.  HENT:  Negative for congestion and sinus pressure.   Respiratory:  Negative for cough, chest tightness and shortness of breath.   Cardiovascular:  Negative for chest pain, palpitations and leg swelling.  Gastrointestinal:  Negative for abdominal pain, diarrhea, nausea and vomiting.  Genitourinary:  Negative for difficulty urinating and dysuria.  Musculoskeletal:  Negative for joint swelling and myalgias.  Skin:  Negative for color change and rash.  Neurological:  Negative for dizziness,  light-headedness and headaches.  Psychiatric/Behavioral:  Negative for agitation and dysphoric mood.       Objective:     BP 130/70 (BP Location: Left Arm, Patient Position: Sitting, Cuff Size: Large)   Pulse 62   Temp 98.3 F (36.8 C) (Temporal)   Resp 14   Ht '5\' 11"'  (1.803 m)   Wt 179 lb 3.2 oz (81.3 kg)   SpO2 97%   BMI 24.99 kg/m  Wt Readings from Last 3 Encounters:  09/01/21 179 lb 3.2 oz (81.3 kg)  06/15/21 175 lb (79.4 kg)  04/24/21 178 lb (80.7 kg)    Physical Exam Constitutional:      General: He is not in acute distress.    Appearance: Normal appearance. He is well-developed.  HENT:     Head: Normocephalic and atraumatic.     Right Ear: External ear normal.     Left Ear: External ear normal.  Eyes:     General: No scleral icterus.       Right eye: No discharge.        Left eye: No discharge.  Cardiovascular:     Rate and Rhythm: Normal rate and regular rhythm.  Pulmonary:     Effort: Pulmonary effort is normal. No respiratory distress.     Breath sounds: Normal breath sounds.  Abdominal:     General: Bowel sounds are normal.     Palpations: Abdomen is soft.     Tenderness: There is no abdominal tenderness.  Musculoskeletal:        General: No swelling or tenderness.     Cervical back: Neck supple. No tenderness.  Lymphadenopathy:     Cervical: No cervical adenopathy.  Skin:    Findings: No erythema or rash.  Neurological:     Mental Status: He is alert.  Psychiatric:        Mood and Affect: Mood normal.        Behavior: Behavior normal.     Outpatient Encounter Medications as of 09/01/2021  Medication Sig   aspirin 81 MG tablet Take 81 mg by mouth daily.   atorvastatin (LIPITOR) 20 MG tablet TAKE ONE TABLET BY MOUTH DAILY   Fiber POWD Take 15 mLs by mouth daily.   Lactobacillus (PROBIOTIC ACIDOPHILUS PO) Take by mouth daily.   metroNIDAZOLE (METROGEL) 0.75 % gel Apply 1 application topically 2 (two) times a week. to face   Multiple Vitamin  (MULTI VITAMIN MENS PO) Take 1 tablet by mouth daily.   Omega-3 Fatty Acids (FISH OIL) 1200 MG CAPS Take 1 capsule by mouth daily.   Omeprazole Magnesium (PRILOSEC OTC PO) Take by mouth daily.   pimecrolimus (ELIDEL) 1 % cream 1 application 3 (three) times a week.   triamcinolone cream (KENALOG) 0.1 % SMARTSIG:1 Application  Topical 2-3 Times Daily   vitamin B-12 (CYANOCOBALAMIN) 500 MCG tablet Take 500 mcg by mouth daily.   [DISCONTINUED] fluticasone (FLONASE) 50 MCG/ACT nasal spray Place 2 sprays into both nostrils daily. (Patient not taking: Reported on 04/14/2021)   No facility-administered encounter medications on file as of 09/01/2021.     Lab Results  Component Value Date   WBC 6.1 03/02/2021   HGB 13.7 03/02/2021   HCT 41.6 03/02/2021   PLT 166.0 03/02/2021   GLUCOSE 100 (H) 09/01/2021   CHOL 133 09/01/2021   TRIG 64.0 09/01/2021   HDL 44.90 09/01/2021   LDLCALC 75 09/01/2021   ALT 14 09/01/2021   AST 17 09/01/2021   NA 140 09/01/2021   K 4.4 09/01/2021   CL 104 09/01/2021   CREATININE 1.03 09/01/2021   BUN 15 09/01/2021   CO2 31 09/01/2021   TSH 1.21 09/01/2021   PSA 1.63 03/02/2021   HGBA1C 5.8 09/01/2021       Assessment & Plan:   Problem List Items Addressed This Visit     Aortic atherosclerosis ([place])    Continue lipitor.         Bladder cancer [facility])    Being followed by urology.         Common iliac aneurysm (HCC)    Sees Dr Lucky Cowboy.  Last evaluated 09/16/2020.  Slight growth (2.9cm) in maximal diameter (aorta).  Right common iliac artery 1.7cm.  Left 1.8cm.  Recommended f/u every other year.         Coronary artery disease    Continue risk factor modification.  Remain on aspirin - low dose.  Stable.        Hypercholesterolemia    On lipitor.  Low cholesterol diet and exercise.  Follow lipid panel and liver function tests.         Relevant Orders   Lipid Profile (Completed)   Hepatic function panel (Completed)   Hyperglycemia - Primary    Low  carb diet and exercise.  Follow met b and a1c.        Relevant Orders   HgB A1c (Completed)   Hypertension, essential    Blood pressure is doing well on no medication.  Follow pressures.  Follow metabolic panel.         Relevant Orders   Basic Metabolic Panel (BMET) (Completed)   Thyroid nodule    Worked up previously by Dr Eddie Dibbles.  Previous biopsy negative.  Last check stable.  Recommended f/u prn.  Follow thyroid function tests.        Relevant Orders   T4, free (Completed)   TSH (Completed)     Einar Pheasant, MD

## 2021-09-06 ENCOUNTER — Encounter: Payer: Self-pay | Admitting: Internal Medicine

## 2021-09-06 DIAGNOSIS — Z8551 Personal history of malignant neoplasm of bladder: Secondary | ICD-10-CM | POA: Insufficient documentation

## 2021-09-06 DIAGNOSIS — C679 Malignant neoplasm of bladder, unspecified: Secondary | ICD-10-CM | POA: Insufficient documentation

## 2021-09-06 NOTE — Assessment & Plan Note (Signed)
Continue risk factor modification.  Remain on aspirin - low dose.  Stable.  

## 2021-09-06 NOTE — Assessment & Plan Note (Signed)
Blood pressure is doing well on no medication.  Follow pressures.  Follow metabolic panel.   

## 2021-09-06 NOTE — Assessment & Plan Note (Signed)
On lipitor.  Low cholesterol diet and exercise.  Follow lipid panel and liver function tests.   

## 2021-09-06 NOTE — Assessment & Plan Note (Signed)
Worked up previously by Dr Paul.  Previous biopsy negative.  Last check stable.  Recommended f/u prn.  Follow thyroid function tests.  

## 2021-09-06 NOTE — Assessment & Plan Note (Signed)
Being followed by urology.  

## 2021-09-06 NOTE — Assessment & Plan Note (Signed)
Low carb diet and exercise.  Follow met b and a1c.  

## 2021-09-06 NOTE — Assessment & Plan Note (Signed)
Sees Dr Dew.  Last evaluated 09/16/2020.  Slight growth (2.9cm) in maximal diameter (aorta).  Right common iliac artery 1.7cm.  Left 1.8cm.  Recommended f/u every other year.   

## 2021-09-06 NOTE — Assessment & Plan Note (Signed)
Continue lipitor  ?

## 2021-09-18 ENCOUNTER — Ambulatory Visit (INDEPENDENT_AMBULATORY_CARE_PROVIDER_SITE_OTHER): Payer: Medicare Other

## 2021-09-18 VITALS — Ht 71.0 in | Wt 179.0 lb

## 2021-09-18 DIAGNOSIS — Z0000 Encounter for general adult medical examination without abnormal findings: Secondary | ICD-10-CM

## 2021-09-18 NOTE — Patient Instructions (Addendum)
  Mr. Nathan Watts , Thank you for taking time to come for your Medicare Wellness Visit. I appreciate your ongoing commitment to your health goals. Please review the following plan we discussed and let me know if I can assist you in the future.   These are the goals we discussed:  Goals      Follow up with Primary Care Provider     As needed.        This is a list of the screening recommended for you and due dates:  Health Maintenance  Topic Date Due   Flu Shot  11/10/2021   Tetanus Vaccine  12/08/2026   Pneumonia Vaccine  Completed   COVID-19 Vaccine  Completed   Hepatitis C Screening: USPSTF Recommendation to screen - Ages 30-79 yo.  Completed   Zoster (Shingles) Vaccine  Completed   HPV Vaccine  Aged Out   Colon Cancer Screening  Discontinued

## 2021-09-18 NOTE — Progress Notes (Signed)
Subjective:   Nathan Watts is a 76 y.o. male who presents for Medicare Annual/Subsequent preventive examination.  Review of Systems    No ROS.  Medicare Wellness Virtual Visit.  Visual/audio telehealth visit, UTA vital signs.   See social history for additional risk factors.   Cardiac Risk Factors include: advanced age (>98mn, >>49women);male gender     Objective:    Today's Vitals   09/18/21 1404  Weight: 179 lb (81.2 kg)  Height: '5\' 11"'$  (1.803 m)   Body mass index is 24.97 kg/m.     09/18/2021    2:08 PM 06/15/2021   11:46 AM 04/24/2021    7:33 AM 04/17/2021    2:24 PM 02/04/2020    8:56 AM 12/14/2019    2:45 PM 02/01/2019    8:44 AM  Advanced Directives  Does Patient Have a Medical Advance Directive? Yes Yes Yes Yes Yes Yes Yes  Type of [place]of [place] will [place] will  Living will;Healthcare Power of [place] will [place] will  Does patient want to make changes to medical advance directive? No - Patient declined  No - Patient declined  No - Patient declined  No - Patient declined  Copy of [place] Chart? No - copy requested Yes - validated most recent copy scanned in chart (See row information)   No - copy requested  No - copy requested    Current Medications (verified) Outpatient Encounter Medications as of 09/18/2021  Medication Sig   aspirin 81 MG tablet Take 81 mg by mouth daily.   atorvastatin (LIPITOR) 20 MG tablet TAKE ONE TABLET BY MOUTH DAILY   Fiber POWD Take 15 mLs by mouth daily.   Lactobacillus (PROBIOTIC ACIDOPHILUS PO) Take by mouth daily.   metroNIDAZOLE (METROGEL) 0.75 % gel Apply 1 application topically 2 (two) times a week. to face   Multiple Vitamin (MULTI VITAMIN MENS PO) Take 1 tablet by mouth daily.   Omega-3 Fatty Acids (FISH OIL) 1200 MG CAPS Take 1  capsule by mouth daily.   Omeprazole Magnesium (PRILOSEC OTC PO) Take by mouth daily.   pimecrolimus (ELIDEL) 1 % cream 1 application 3 (three) times a week.   vitamin B-12 (CYANOCOBALAMIN) 500 MCG tablet Take 500 mcg by mouth daily.   triamcinolone cream (KENALOG) 0.1 % SMARTSIG:1 Application Topical 2-3 Times Daily   No facility-administered encounter medications on file as of 09/18/2021.    Allergies (verified) Crestor [rosuvastatin] and Hydrocodone-chlorpheniramine   History: Past Medical History:  Diagnosis Date   Anemia, unspecified    BPH (benign prostatic hyperplasia)    Fatty infiltration of liver    GERD (gastroesophageal reflux disease)    Hypercholesterolemia    Hyperglycemia    Hypertension    Thrombocytopenia (HCC)    Thyroid nodule    Past Surgical History:  Procedure Laterality Date   COLONOSCOPY WITH PROPOFOL N/A 10/02/2014   Procedure: COLONOSCOPY WITH PROPOFOL;  Surgeon: RManya Silvas MD;  [place]  Service: Endoscopy;  Laterality: N/A;   COLONOSCOPY WITH PROPOFOL N/A 06/15/2021   Procedure: COLONOSCOPY WITH PROPOFOL;  Surgeon: LLesly Rubenstein MD;  Location: [facility];  Service: Endoscopy;  Laterality: N/A;   ESOPHAGOGASTRODUODENOSCOPY (EGD) WITH PROPOFOL N/A 06/15/2021   Procedure: ESOPHAGOGASTRODUODENOSCOPY (EGD) WITH PROPOFOL;  Surgeon: LLesly Rubenstein MD;  Location: [facility];  Service: Endoscopy;  Laterality: N/A;   HOLEP-LASER ENUCLEATION  OF THE PROSTATE WITH MORCELLATION N/A 04/24/2021   Procedure: HOLEP-LASER ENUCLEATION OF THE PROSTATE WITH MORCELLATION;  Surgeon: Billey Co, MD;  Location: [facility];  Service: Urology;  Laterality: N/A;   INGUINAL HERNIA REPAIR     TONSILLECTOMY     TRANSURETHRAL RESECTION OF BLADDER TUMOR WITH MITOMYCIN-C N/A 04/24/2021   Procedure: 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;  Surgeon: Billey Co, MD;  Location: [facility];  Service: Urology;  Laterality: N/A;    Family History  Problem Relation Age of Onset   Prostate cancer Father    Heart disease Father        s/p CABG   Stroke Mother    Colon cancer Neg Hx    Social History   Socioeconomic History   Marital status: Married    Spouse name: Not on file   Number of children: 4   Years of education: Not on file   Highest education level: Not on file  Occupational History   Occupation: retired  Tobacco Use   Smoking status: Former    Packs/day: 1.00    Years: 30.00    Total pack years: 30.00    Types: Cigarettes    Quit date: 04/12/1998    Years since quitting: 23.4   Smokeless tobacco: Never  Vaping Use   Vaping Use: Never used  Substance and Sexual Activity   Alcohol use: Not Currently    Alcohol/week: 1.0 standard drink of alcohol    Types: 1 Cans of beer per week    Comment: OCC   Drug use: Never   Sexual activity: Never  Other Topics Concern   Not on file  Social History Narrative   He is married. Has four children   Social Determinants of Health   Financial Resource Strain: Low Risk  (02/04/2020)   Overall Financial Resource Strain (CARDIA)    Difficulty of Paying Living Expenses: Not hard at all  Food Insecurity: No Food Insecurity (09/18/2021)   Hunger Vital Sign    Worried About Running Out of Food in the Last Year: Never true    Ran Out of Food in the Last Year: Never true  Transportation Needs: No Transportation Needs (09/18/2021)   PRAPARE - Hydrologist (Medical): No    Lack of Transportation (Non-Medical): No  Physical Activity: Sufficiently Active (09/18/2021)   Exercise Vital Sign    Days of Exercise per Week: 7 days    Minutes of Exercise per Session: 120 min  Stress: No Stress Concern Present (09/18/2021)   [place]    Feeling of Stress : Not at all  Social Connections: Not on file    Tobacco Counseling Counseling given: Not Answered   Clinical  Intake:  Pre-visit preparation completed: Yes        Diabetes: No  How often do you need to have someone help you when you read instructions, pamphlets, or other written materials from your doctor or pharmacy?: 1 - Never   Interpreter Needed?: No      Activities of Daily Living    09/18/2021    2:07 PM 04/24/2021    8:12 AM  In your present state of health, do you have any difficulty performing the following activities:  Hearing? 0   Vision? 0   Difficulty concentrating or making decisions? 0   Walking or climbing stairs? 0   Dressing or bathing? 0   Doing errands, shopping? 0  0  Preparing Food and eating ? N   Using the Toilet? N   In the past six months, have you accidently leaked urine? N   Comment Managed by Urology every 3-4 months   Do you have problems with loss of bowel control? N   Managing your Medications? N   Managing your Finances? N   Housekeeping or managing your Housekeeping? N     Patient Care Team: Einar Pheasant, MD as PCP - General (Internal Medicine)  Indicate any recent Medical Services you may have received from other than [facility] providers in the past year (date may be approximate).     Assessment:   This is a routine wellness examination for Nathan Watts.  Virtual Visit via Telephone Note I connected with  [name] on 09/18/21 at  2:00 PM EDT by telephone and verified that I am speaking with the correct person using two identifiers.  Persons participating in the virtual visit: patient/Nurse Health Advisor   I discussed the limitations of performing an evaluation and management service by telehealth. We continued and completed visit with audio only. Some vital signs may be absent or patient reported.   Hearing/Vision screen Hearing Screening - Comments:: Patient is able to hear conversational tones without difficulty. No issues reported.  Vision Screening - Comments:: Wears corrective lenses  They have seen their ophthalmologist in the  last 12 months.   Dietary issues and exercise activities discussed: Current Exercise Habits: Home exercise routine, Type of exercise: walking, Frequency (Times/Week): 5, Intensity: Moderate Healthy diet   Goals Addressed             This Visit's Progress    Follow up with Primary Care Provider       As needed.       Depression Screen    09/18/2021    3:02 PM 09/01/2021    8:31 AM 03/02/2021    8:44 AM 02/04/2020    8:54 AM 11/13/2019    8:20 AM 02/01/2019    8:44 AM 11/08/2018    8:10 AM  PHQ 2/9 Scores  PHQ - 2 Score 0 0 0 0 0 0 0    Fall Risk    09/01/2021    8:31 AM 03/02/2021    8:44 AM 02/04/2020    8:57 AM 11/13/2019    8:11 AM 05/15/2019    8:29 AM  Fall Risk   Falls in the past year? 0 0 0 0 0  Number falls in past yr:  0 0    Injury with Fall?  0 0    Risk for fall due to : No Fall Risks No Fall Risks     Follow up Falls evaluation completed Falls evaluation completed Falls evaluation completed Falls evaluation completed Falls evaluation completed    [place]: Home free of loose throw rugs in walkways, pet beds, electrical cords, etc? Yes  Adequate lighting in your home to reduce risk of falls? Yes   ASSISTIVE DEVICES UTILIZED TO PREVENT FALLS: Life alert? No  Use of a cane, walker or w/c? No   TIMED UP AND GO: Was the test performed? No .   Cognitive Function:  Patient is alert and oriented x3.  Enjoys reading and other brain health stimulating activities.       02/01/2019    8:52 AM  6CIT Screen  What Year? 0 points  What month? 0 points  What time? 0 points  Count back from 20 0  points  Months in reverse 0 points  Repeat phrase 0 points  Total Score 0 points    Immunizations Immunization History  Administered Date(s) Administered   Fluad Quad(high Dose 65+) 12/12/2018, 01/04/2020, 03/02/2021   Hepb-cpg 04/17/2021   Influenza Split 01/28/2012   Influenza, High Dose Seasonal PF 01/24/2016, 02/04/2017,  01/16/2018   Influenza,inj,Quad PF,6+ Mos 12/28/2012, 01/05/2014, 01/06/2015   PFIZER(Purple Top)SARS-COV-2 Vaccination 04/18/2019, 05/09/2019, 12/04/2019, 07/22/2020   Pfizer Covid-19 Vaccine Bivalent Booster 43yr & up 01/08/2021   Pneumococcal Conjugate-13 01/23/2014   Pneumococcal Polysaccharide-23 03/14/2012   Tdap 12/07/2016   Zoster Recombinat (Shingrix) 11/02/2016, 03/09/2017   Screening Tests Health Maintenance  Topic Date Due   INFLUENZA VACCINE  11/10/2021   TETANUS/TDAP  12/08/2026   Pneumonia Vaccine 76 Years old  Completed   COVID-19 Vaccine  Completed   Hepatitis C Screening  Completed   Zoster Vaccines- Shingrix  Completed   HPV VACCINES  Aged Out   COLONOSCOPY (Pts 45-450yrInsurance coverage will need to be confirmed)  Discontinued   Health Maintenance There are no preventive care reminders to display for this patient.  Lung Cancer Screening: (Low Dose CT Chest recommended if Age 77-80ears, 30 pack-year currently smoking OR have quit w/in 15years.) does not qualify.   Vision Screening: Recommended annual ophthalmology exams for early detection of glaucoma and other disorders of the eye.  Dental Screening: Recommended annual dental exams for proper oral hygiene  Community Resource Referral / Chronic Care Management: CRR required this visit?  No   CCM required this visit?  No      Plan:   Keep all routine maintenance appointments.   I have personally reviewed and noted the following in the patient's chart:   Medical and social history Use of alcohol, tobacco or illicit drugs  Current medications and supplements including opioid prescriptions. Patient is not currently taking opioid prescriptions. Functional ability and status Nutritional status Physical activity Advanced directives List of other physicians Hospitalizations, surgeries, and ER visits in previous 12 months Vitals Screenings to include cognitive, depression, and falls Referrals and  appointments  In addition, I have reviewed and discussed with patient certain preventive protocols, quality metrics, and best practice recommendations. A written personalized care plan for preventive services as well as general preventive health recommendations were provided to patient.     OBVarney BilesLPN   6/05/20/32

## 2021-12-03 ENCOUNTER — Ambulatory Visit: Payer: Medicare Other | Admitting: Urology

## 2021-12-03 ENCOUNTER — Encounter: Payer: Self-pay | Admitting: Urology

## 2021-12-03 VITALS — BP 132/75 | HR 54 | Ht 71.0 in | Wt 170.0 lb

## 2021-12-03 DIAGNOSIS — Z9889 Other specified postprocedural states: Secondary | ICD-10-CM

## 2021-12-03 DIAGNOSIS — D494 Neoplasm of unspecified behavior of bladder: Secondary | ICD-10-CM

## 2021-12-03 DIAGNOSIS — Z8551 Personal history of malignant neoplasm of bladder: Secondary | ICD-10-CM

## 2021-12-03 LAB — URINALYSIS, COMPLETE
Bilirubin, UA: NEGATIVE
Glucose, UA: NEGATIVE
Ketones, UA: NEGATIVE
Leukocytes,UA: NEGATIVE
Nitrite, UA: NEGATIVE
Protein,UA: NEGATIVE
Specific Gravity, UA: 1.01 (ref 1.005–1.030)
Urobilinogen, Ur: 0.2 mg/dL (ref 0.2–1.0)
pH, UA: 6 (ref 5.0–7.5)

## 2021-12-03 LAB — MICROSCOPIC EXAMINATION: Bacteria, UA: NONE SEEN

## 2021-12-03 MED ORDER — CEPHALEXIN 250 MG PO CAPS
500.0000 mg | ORAL_CAPSULE | Freq: Once | ORAL | Status: AC
  Administered 2021-12-03: 500 mg via ORAL

## 2021-12-03 MED ORDER — LIDOCAINE HCL URETHRAL/MUCOSAL 2 % EX GEL
1.0000 | Freq: Once | CUTANEOUS | Status: AC
  Administered 2021-12-03: 1 via URETHRAL

## 2021-12-03 NOTE — Progress Notes (Signed)
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  Simultaneous HOLEP  After informed consent and discussion of the procedure and its risks, Bernardo Brayman was positioned and prepped in the standard fashion. Cystoscopy was performed with a flexible cystoscope. The urethra, bladder neck and entire bladder was visualized in a standard fashion.  Wide open prostatic fossa after HOLEP.  The ureteral orifices were visualized in their normal location and orientation.  Moderate bladder trabeculations, no suspicious lesions, no abnormalities on retroflexion.   Findings: No evidence of recurrence  Assessment and Plan: -Using shared decision making, he opted for follow-up cystoscopy in 6 months.  Consider q6 months for the next year, then yearly -Recovering well from [facility] appropriately, voiding with an excellent stream, incontinence resolved  Nickolas Madrid, MD 12/03/2021

## 2021-12-11 ENCOUNTER — Telehealth: Payer: Self-pay

## 2021-12-11 ENCOUNTER — Encounter: Payer: Self-pay | Admitting: Internal Medicine

## 2021-12-11 ENCOUNTER — Ambulatory Visit: Payer: Medicare Other | Admitting: Internal Medicine

## 2021-12-11 ENCOUNTER — Ambulatory Visit: Payer: Medicare Other | Attending: Cardiology

## 2021-12-11 VITALS — BP 120/68 | HR 50 | Temp 97.9°F | Ht 71.0 in | Wt 172.4 lb

## 2021-12-11 DIAGNOSIS — R002 Palpitations: Secondary | ICD-10-CM | POA: Diagnosis not present

## 2021-12-11 DIAGNOSIS — E041 Nontoxic single thyroid nodule: Secondary | ICD-10-CM

## 2021-12-11 DIAGNOSIS — E7800 Pure hypercholesterolemia, unspecified: Secondary | ICD-10-CM

## 2021-12-11 DIAGNOSIS — R739 Hyperglycemia, unspecified: Secondary | ICD-10-CM

## 2021-12-11 DIAGNOSIS — I723 Aneurysm of iliac artery: Secondary | ICD-10-CM

## 2021-12-11 DIAGNOSIS — I10 Essential (primary) hypertension: Secondary | ICD-10-CM

## 2021-12-11 DIAGNOSIS — I700 Atherosclerosis of aorta: Secondary | ICD-10-CM | POA: Diagnosis not present

## 2021-12-11 DIAGNOSIS — Z8551 Personal history of malignant neoplasm of bladder: Secondary | ICD-10-CM

## 2021-12-11 LAB — CBC WITH DIFFERENTIAL/PLATELET
Basophils Absolute: 0 10*3/uL (ref 0.0–0.1)
Basophils Relative: 0.6 % (ref 0.0–3.0)
Eosinophils Absolute: 0.1 10*3/uL (ref 0.0–0.7)
Eosinophils Relative: 1.5 % (ref 0.0–5.0)
HCT: 40 % (ref 39.0–52.0)
Hemoglobin: 13.2 g/dL (ref 13.0–17.0)
Lymphocytes Relative: 24.2 % (ref 12.0–46.0)
Lymphs Abs: 1.7 10*3/uL (ref 0.7–4.0)
MCHC: 32.9 g/dL (ref 30.0–36.0)
MCV: 90.9 fl (ref 78.0–100.0)
Monocytes Absolute: 0.6 10*3/uL (ref 0.1–1.0)
Monocytes Relative: 9.5 % (ref 3.0–12.0)
Neutro Abs: 4.4 10*3/uL (ref 1.4–7.7)
Neutrophils Relative %: 64.2 % (ref 43.0–77.0)
Platelets: 151 10*3/uL (ref 150.0–400.0)
RBC: 4.4 Mil/uL (ref 4.22–5.81)
RDW: 14.2 % (ref 11.5–15.5)
WBC: 6.8 10*3/uL (ref 4.0–10.5)

## 2021-12-11 LAB — BASIC METABOLIC PANEL
BUN: 14 mg/dL (ref 6–23)
CO2: 31 mEq/L (ref 19–32)
Calcium: 9.3 mg/dL (ref 8.4–10.5)
Chloride: 105 mEq/L (ref 96–112)
Creatinine, Ser: 0.97 mg/dL (ref 0.40–1.50)
GFR: 75.43 mL/min (ref >60.00)
Glucose, Bld: 94 mg/dL (ref 70–99)
Potassium: 4.4 mEq/L (ref 3.5–5.1)
Sodium: 140 mEq/L (ref 135–145)

## 2021-12-11 LAB — TSH: TSH: 1.09 u[IU]/mL (ref 0.35–5.50)

## 2021-12-11 NOTE — Progress Notes (Unsigned)
Patient ID: Nathan Watts, male   DOB: 1945-06-29, 76 y.o.   MRN: [id number]   Subjective:    Patient ID: Nathan Watts, male    DOB: October 04, 1945, 76 y.o.   MRN: [id number]   Patient here for work in appt.   Chief Complaint  Patient presents with   stomach issues    Feels that stomach is jittery & jumping around for no reason. At night when lays down he feel sometimes that heart is pounding. NO CP, SOB & not bothered by exertion.    Marland Kitchen   HPI Work in appt with concerns regarding increased jumping sensation in his upper stomach/lower chest.  Has noticed over the past week.  He is very active.  Still exercising and walking - 5 miles per day.  No chest pain or sob with his exercise or at other times.  Notices when sitting and when lying down at night.  Will feel a strong heart beat.  Notices when lies on left side - stronger heart beat.  No cough or congestion.  No acid reflux.  No abdominal pain.  No dizziness or light headedness.  No syncope or near syncopal episodes.     Past Medical History:  Diagnosis Date   Anemia, unspecified    BPH (benign prostatic hyperplasia)    Fatty infiltration of liver    GERD (gastroesophageal reflux disease)    Hypercholesterolemia    Hyperglycemia    Hypertension    Thrombocytopenia (HCC)    Thyroid nodule    Past Surgical History:  Procedure Laterality Date   COLONOSCOPY WITH PROPOFOL N/A 10/02/2014   Procedure: COLONOSCOPY WITH PROPOFOL;  Surgeon: Manya Silvas, MD;  Location: [place];  Service: Endoscopy;  Laterality: N/A;   COLONOSCOPY WITH PROPOFOL N/A 06/15/2021   Procedure: COLONOSCOPY WITH PROPOFOL;  Surgeon: Lesly Rubenstein, MD;  Location: [facility];  Service: Endoscopy;  Laterality: N/A;   ESOPHAGOGASTRODUODENOSCOPY (EGD) WITH PROPOFOL N/A 06/15/2021   Procedure: ESOPHAGOGASTRODUODENOSCOPY (EGD) WITH PROPOFOL;  Surgeon: Lesly Rubenstein, MD;  Location: [facility];  Service: Endoscopy;  Laterality: N/A;    HOLEP-LASER ENUCLEATION OF THE PROSTATE WITH MORCELLATION N/A 04/24/2021   Procedure: HOLEP-LASER ENUCLEATION OF THE PROSTATE WITH MORCELLATION;  Surgeon: Billey Co, MD;  Location: [facility];  Service: Urology;  Laterality: N/A;   INGUINAL HERNIA REPAIR     TONSILLECTOMY     TRANSURETHRAL RESECTION OF BLADDER TUMOR WITH MITOMYCIN-C N/A 04/24/2021   Procedure: 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;  Surgeon: Billey Co, MD;  Location: [facility];  Service: Urology;  Laterality: N/A;   Family History  Problem Relation Age of Onset   Prostate cancer Father    Heart disease Father        s/p CABG   Stroke Mother    Colon cancer Neg Hx    Social History   Socioeconomic History   Marital status: Married    Spouse name: Not on file   Number of children: 4   Years of education: Not on file   Highest education level: Not on file  Occupational History   Occupation: retired  Tobacco Use   Smoking status: Former    Packs/day: 1.00    Years: 30.00    Total pack years: 30.00    Types: Cigarettes    Quit date: 04/12/1998    Years since quitting: 23.6   Smokeless tobacco: Never  Vaping Use   Vaping Use: Never used  Substance and Sexual Activity  Alcohol use: Not Currently    Alcohol/week: 1.0 standard drink of alcohol    Types: 1 Cans of beer per week    Comment: OCC   Drug use: Never   Sexual activity: Never  Other Topics Concern   Not on file  Social History Narrative   He is married. Has four children   Social Determinants of Health   Financial Resource Strain: Low Risk  (02/04/2020)   Overall Financial Resource Strain (CARDIA)    Difficulty of Paying Living Expenses: Not hard at all  Food Insecurity: No Food Insecurity (09/18/2021)   Hunger Vital Sign    Worried About Running Out of Food in the Last Year: Never true    Ran Out of Food in the Last Year: Never true  Transportation Needs: No Transportation Needs (09/18/2021)   PRAPARE - Armed forces logistics/support/administrative officer (Medical): No    Lack of Transportation (Non-Medical): No  Physical Activity: Sufficiently Active (09/18/2021)   Exercise Vital Sign    Days of Exercise per Week: 7 days    Minutes of Exercise per Session: 120 min  Stress: No Stress Concern Present (09/18/2021)   [place]    Feeling of Stress : Not at all  Social Connections: Not on file     Review of Systems  Constitutional:  Negative for appetite change and unexpected weight change.  HENT:  Negative for congestion and sinus pressure.   Respiratory:  Negative for cough, chest tightness and shortness of breath.   Cardiovascular:  Negative for chest pain and leg swelling.       Jittery feeling in upper stomach/lower chest as outlined.    Gastrointestinal:  Negative for abdominal pain, diarrhea, nausea and vomiting.  Genitourinary:  Negative for difficulty urinating and dysuria.  Musculoskeletal:  Negative for joint swelling and myalgias.  Skin:  Negative for color change and rash.  Neurological:  Negative for dizziness, light-headedness and headaches.  Psychiatric/Behavioral:  Negative for agitation and dysphoric mood.        Objective:     BP 120/68   Pulse (!) 50   Temp 97.9 F (36.6 C) (Oral)   Ht _0  (1.803 m)   Wt 172 lb 6.4 oz (78.2 kg)   SpO2 97%   BMI 24.04 kg/m  Wt Readings from Last 3 Encounters:  12/11/21 172 lb 6.4 oz (78.2 kg)  12/03/21 170 lb (77.1 kg)  09/18/21 179 lb (81.2 kg)    Physical Exam Constitutional:      General: He is not in acute distress.    Appearance: Normal appearance. He is well-developed.  HENT:     Head: Normocephalic and atraumatic.     Right Ear: External ear normal.     Left Ear: External ear normal.  Eyes:     General: No scleral icterus.       Right eye: No discharge.        Left eye: No discharge.  Cardiovascular:     Rate and Rhythm: Normal rate and regular rhythm.   Pulmonary:     Effort: Pulmonary effort is normal. No respiratory distress.     Breath sounds: Normal breath sounds.  Abdominal:     General: Bowel sounds are normal.     Palpations: Abdomen is soft.     Tenderness: There is no abdominal tenderness.  Musculoskeletal:        General: No swelling or tenderness.  Cervical back: Neck supple. No tenderness.  Lymphadenopathy:     Cervical: No cervical adenopathy.  Skin:    Findings: No erythema or rash.  Neurological:     Mental Status: He is alert.  Psychiatric:        Mood and Affect: Mood normal.        Behavior: Behavior normal.      Outpatient Encounter Medications as of 12/11/2021  Medication Sig   aspirin 81 MG tablet Take 81 mg by mouth daily.   atorvastatin (LIPITOR) 20 MG tablet TAKE ONE TABLET BY MOUTH DAILY   Fiber POWD Take 15 mLs by mouth daily.   Lactobacillus (PROBIOTIC ACIDOPHILUS PO) Take by mouth daily.   metroNIDAZOLE (METROGEL) 0.75 % gel Apply 1 application topically 2 (two) times a week. to face   Multiple Vitamin (MULTI VITAMIN MENS PO) Take 1 tablet by mouth daily.   Omega-3 Fatty Acids (FISH OIL) 1200 MG CAPS Take 1 capsule by mouth daily.   Omeprazole Magnesium (PRILOSEC OTC PO) Take by mouth daily.   pimecrolimus (ELIDEL) 1 % cream 1 application 3 (three) times a week.   vitamin B-12 (CYANOCOBALAMIN) 500 MCG tablet Take 500 mcg by mouth daily.   No facility-administered encounter medications on file as of 12/11/2021.     Lab Results  Component Value Date   WBC 6.8 12/11/2021   HGB 13.2 12/11/2021   HCT 40.0 12/11/2021   PLT 151.0 12/11/2021   GLUCOSE 94 12/11/2021   CHOL 133 09/01/2021   TRIG 64.0 09/01/2021   HDL 44.90 09/01/2021   LDLCALC 75 09/01/2021   ALT 14 09/01/2021   AST 17 09/01/2021   NA 140 12/11/2021   K 4.4 12/11/2021   CL 105 12/11/2021   CREATININE 0.97 12/11/2021   BUN 14 12/11/2021   CO2 31 12/11/2021   TSH 1.09 12/11/2021   PSA 1.63 03/02/2021   HGBA1C 5.8  09/01/2021       Assessment & Plan:   Problem List Items Addressed This Visit     Aortic atherosclerosis ([place])    Continue lipitor.        Common iliac aneurysm (HCC)    Sees Dr Lucky Cowboy.  Last evaluated 09/16/2020.  Slight growth (2.9cm) in maximal diameter (aorta).  Right common iliac artery 1.7cm.  Left 1.8cm.  Recommended f/u every other year.        History of bladder cancer    Dr Diamantina Providence 12/03/21 - cystoscopy - no evidence of recurrence.  Plan f/u cystoscopy in 6 months.      Hypercholesterolemia    On lipitor.  Low cholesterol diet and exercise.  Follow lipid panel and liver function tests.        Hyperglycemia    Low carb diet and exercise.  Follow met b and a1c.       Hypertension, essential    Blood pressure is doing well on no medication.  Follow pressures.  Follow metabolic panel.        Palpitations - Primary    Describes jittery feeling in stomach, strong heart beat, etc.  No associated symptoms.  Staying active.  EKG - SR/SB with PACs.  Discussed with cardiology.  Plan zio monitor.  appt with cardiology.  Discussed further w/up, including possible echo, etc.        Relevant Orders   EKG 12-Lead (Completed)   CBC with Differential/Platelet (Completed)   TSH (Completed)   Basic metabolic panel (Completed)   Ambulatory referral to Cardiology   Thyroid  nodule    Worked up previously by Dr Eddie Dibbles.  Previous biopsy negative.  Last check stable.  Recommended f/u prn.  Follow thyroid function tests.         Einar Pheasant, MD

## 2021-12-11 NOTE — Telephone Encounter (Signed)
Called patient and informed him that I was sending him a Zio monitor as requested below in the secure chat between Dr. Garen Lah and Dr. Nicki Reaper. Instructed him on how to apply it, to wear it for 14 days, and how to send it back.  I informed the patient that our office would be reaching out to schedule him for a New patient visit in 6 weeks.

## 2021-12-12 ENCOUNTER — Encounter: Payer: Self-pay | Admitting: Internal Medicine

## 2021-12-12 DIAGNOSIS — R002 Palpitations: Secondary | ICD-10-CM | POA: Insufficient documentation

## 2021-12-12 NOTE — Assessment & Plan Note (Signed)
Sees Dr Dew.  Last evaluated 09/16/2020.  Slight growth (2.9cm) in maximal diameter (aorta).  Right common iliac artery 1.7cm.  Left 1.8cm.  Recommended f/u every other year.   

## 2021-12-12 NOTE — Assessment & Plan Note (Signed)
Describes jittery feeling in stomach, strong heart beat, etc.  No associated symptoms.  Staying active.  EKG - SR/SB with PACs.  Discussed with cardiology.  Plan zio monitor.  appt with cardiology.  Discussed further w/up, including possible echo, etc.

## 2021-12-12 NOTE — Assessment & Plan Note (Signed)
Continue lipitor  ?

## 2021-12-12 NOTE — Assessment & Plan Note (Signed)
Low carb diet and exercise.  Follow met b and a1c.  

## 2021-12-12 NOTE — Assessment & Plan Note (Signed)
Blood pressure is doing well on no medication.  Follow pressures.  Follow metabolic panel.   

## 2021-12-12 NOTE — Assessment & Plan Note (Signed)
Worked up previously by Dr Paul.  Previous biopsy negative.  Last check stable.  Recommended f/u prn.  Follow thyroid function tests.  

## 2021-12-12 NOTE — Assessment & Plan Note (Signed)
Dr Sninsky 12/03/21 - cystoscopy - no evidence of recurrence.  Plan f/u cystoscopy in 6 months. 

## 2021-12-12 NOTE — Assessment & Plan Note (Signed)
On lipitor.  Low cholesterol diet and exercise.  Follow lipid panel and liver function tests.   

## 2021-12-15 DIAGNOSIS — R002 Palpitations: Secondary | ICD-10-CM

## 2021-12-25 ENCOUNTER — Ambulatory Visit: Payer: Medicare Other | Admitting: Internal Medicine

## 2021-12-26 ENCOUNTER — Other Ambulatory Visit: Payer: Self-pay | Admitting: Internal Medicine

## 2021-12-29 ENCOUNTER — Ambulatory Visit: Payer: Medicare Other | Admitting: Internal Medicine

## 2021-12-31 ENCOUNTER — Telehealth: Payer: Self-pay | Admitting: Cardiology

## 2021-12-31 DIAGNOSIS — I4892 Unspecified atrial flutter: Secondary | ICD-10-CM

## 2021-12-31 DIAGNOSIS — R002 Palpitations: Secondary | ICD-10-CM | POA: Diagnosis not present

## 2021-12-31 MED ORDER — APIXABAN 5 MG PO TABS: 5.0000 mg | ORAL_TABLET | Freq: Two times a day (BID) | ORAL | 1 refills | Status: DC

## 2021-12-31 NOTE — Telephone Encounter (Signed)
As of 3:30 pm the final report is not available on the [employer].  Called [employer] and spoke with Abby in the clinical dept. Abby sts that the rqst for the final report was sent at the time of the original call at 2:15pm. Its has still not been uploaded. Abby will send a rqst to have the final report expedited.

## 2021-12-31 NOTE — Telephone Encounter (Signed)
Jocelyn from [facility] calling with abnormal zio patch results.

## 2021-12-31 NOTE — Telephone Encounter (Addendum)
Final report printed and reviewed by Dr. Garen Lah. (Final report sent to batch scanning)  Per Dr. Mylo Red- Charlestine Night.  Report shows Paroxsymal Atrial Flutter and possible tachy-brady.  Dr. Thereasa Solo recommendation:  - Stop Aspirin - Start Eliquis 5 mg bid - Echo - Asap appt with (EP) Dr. Quentin Ore for Aflutter/poss tachy brady - Patient should f/u with Dr. Garen Lah as planned   Called the patient. Patient sts that he has had a slower HR for years. Pt is currently asymptomatic. Patient made aware of Dr. Thereasa Solo recommendation and he is agreeable with the plan.  Rx for Eliquis '5mg'$  bid has been sent to the patients pharmacy Adv the pt that a scheduler will call him to schedule the echo and urgent appt with Dr. Lindley Magnus the patient to f/u with Dr. Garen Lah as planned. Adv the patient to contact the office in the interim if cardiac symptoms develop (chest pain, sob, dizziness/lightheadiness, pre-syncope/syncope,increased palpitations).  Patient verbalized understanding to the instructions given and voiced appreciation for the call.

## 2021-12-31 NOTE — Telephone Encounter (Signed)
   Incoming triage stat call received from [facility].  Cardiac Monitor Alert  Date of alert:  12/31/2021   Patient Name: Nathan Watts  DOB: [birth year]  MRN: [id number]   [place] Cardiologist: NEW (Dr. Garen Lah) [facility] EP:  None    Monitor Information: Long Term Monitor [ZioXT]  Reason:  Palpitations Ordering provider:  Dr. Garen Lah {  Alert Bradycardia - slowest HR: 40 bpm Duration 30 sec Strip 10 Pg 14 This is the 1st alert for this rhythm.   Next Cardiology Appointment {  Date:  02/04/22  Provider:  Dr. Garen Lah  Patient was not contacted. Per Jocelyn the report should be available for review in 15 mins. As of 2:46 pm on the [employer] website they are still processing the data.   Arrhythmia, symptoms and history reviewed with  .   Plan:       Lamar Laundry, RN  12/31/2021 2:18 PM

## 2022-01-06 ENCOUNTER — Ambulatory Visit (INDEPENDENT_AMBULATORY_CARE_PROVIDER_SITE_OTHER): Payer: Medicare Other

## 2022-01-06 DIAGNOSIS — Z23 Encounter for immunization: Secondary | ICD-10-CM | POA: Diagnosis not present

## 2022-01-11 ENCOUNTER — Ambulatory Visit
Admission: RE | Admit: 2022-01-11 | Discharge: 2022-01-11 | Disposition: A | Discharge status: Home / Self Care | Payer: Medicare Other | Source: Ambulatory Visit | Attending: Cardiology | Admitting: Cardiology

## 2022-01-11 DIAGNOSIS — I10 Essential (primary) hypertension: Secondary | ICD-10-CM | POA: Diagnosis not present

## 2022-01-11 DIAGNOSIS — I4892 Unspecified atrial flutter: Secondary | ICD-10-CM | POA: Diagnosis not present

## 2022-01-11 DIAGNOSIS — I081 Rheumatic disorders of both mitral and tricuspid valves: Secondary | ICD-10-CM | POA: Insufficient documentation

## 2022-01-11 LAB — ECHOCARDIOGRAM COMPLETE
AR max vel: 2.71 cm2
AV Area VTI: 2.68 cm2
AV Area mean vel: 2.68 cm2
AV Mean grad: 3 mmHg
AV Peak grad: 5.8 mmHg
Ao pk vel: 1.21 m/s
Area-P 1/2: 3.12 cm2
Calc EF: 55.6 %
S' Lateral: 3.5 cm
Single Plane A2C EF: 56 %
Single Plane A4C EF: 56.2 %

## 2022-01-11 NOTE — Progress Notes (Signed)
*  PRELIMINARY RESULTS* Echocardiogram 2D Echocardiogram has been performed.  Sherrie Sport 01/11/2022, 10:30 AM

## 2022-01-12 NOTE — Progress Notes (Unsigned)
Electrophysiology Office Note:    Date:  01/13/2022   ID:  Nathan Watts, Nathan Watts November 07, 1945, MRN [id number]  PCP:  Einar Pheasant, MD  [facility] Cardiologist:  None  [facility] Electrophysiologist:  Vickie Epley, MD   Referring MD: Kate Sable, MD   Chief Complaint: Atrial arrhythmias  History of Present Illness:    Nathan Watts is a 76 y.o. male who presents for an evaluation of atrial arrhythmias at the request of Dr. Garen Lah. Their medical history includes GERD, hyperlipidemia, hypertension and atrial fibrillation/flutter.  The patient saw his primary care physician on December 11, 2021 and reported that his stomach felt jittery and jumping around for no reason.  The patient is very active and walks about 5 miles per day.  A ZIO monitor was ordered and he was referred to cardiology.  Prior to the patient making his appoint with cardiology his ZIO monitor returned positive for atrial fibrillation and flutter and he was referred to electrophysiology.  The symptoms have been present for at least the last month.  They occur predominantly at night.  He has tolerated the Eliquis without any bleeding issues.  No family history of atrial fibrillation that he is aware of.  No stroke history in his family that he is aware of.      Past Medical History:  Diagnosis Date   Anemia, unspecified    BPH (benign prostatic hyperplasia)    Fatty infiltration of liver    GERD (gastroesophageal reflux disease)    Hypercholesterolemia    Hyperglycemia    Hypertension    Thrombocytopenia (HCC)    Thyroid nodule     Past Surgical History:  Procedure Laterality Date   COLONOSCOPY WITH PROPOFOL N/A 10/02/2014   Procedure: COLONOSCOPY WITH PROPOFOL;  Surgeon: Manya Silvas, MD;  Location: [place];  Service: Endoscopy;  Laterality: N/A;   COLONOSCOPY WITH PROPOFOL N/A 06/15/2021   Procedure: COLONOSCOPY WITH PROPOFOL;  Surgeon: Lesly Rubenstein, MD;   Location: [facility];  Service: Endoscopy;  Laterality: N/A;   ESOPHAGOGASTRODUODENOSCOPY (EGD) WITH PROPOFOL N/A 06/15/2021   Procedure: ESOPHAGOGASTRODUODENOSCOPY (EGD) WITH PROPOFOL;  Surgeon: Lesly Rubenstein, MD;  Location: [facility];  Service: Endoscopy;  Laterality: N/A;   HOLEP-LASER ENUCLEATION OF THE PROSTATE WITH MORCELLATION N/A 04/24/2021   Procedure: HOLEP-LASER ENUCLEATION OF THE PROSTATE WITH MORCELLATION;  Surgeon: Billey Co, MD;  Location: [facility];  Service: Urology;  Laterality: N/A;   INGUINAL HERNIA REPAIR     TONSILLECTOMY     TRANSURETHRAL RESECTION OF BLADDER TUMOR WITH MITOMYCIN-C N/A 04/24/2021   Procedure: 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;  Surgeon: Billey Co, MD;  Location: [facility];  Service: Urology;  Laterality: N/A;    Current Medications: Current Meds  Medication Sig   apixaban (ELIQUIS) 5 MG TABS tablet Take 1 tablet (5 mg total) by mouth 2 (two) times daily.   atorvastatin (LIPITOR) 20 MG tablet TAKE ONE TABLET BY MOUTH DAILY   Fiber POWD Take 15 mLs by mouth daily.   Lactobacillus (PROBIOTIC ACIDOPHILUS PO) Take by mouth daily.   metroNIDAZOLE (METROGEL) 0.75 % gel Apply 1 application topically 2 (two) times a week. to face   Multiple Vitamin (MULTI VITAMIN MENS PO) Take 1 tablet by mouth daily.   Omega-3 Fatty Acids (FISH OIL) 1200 MG CAPS Take 1 capsule by mouth daily.   Omeprazole Magnesium (PRILOSEC OTC PO) Take by mouth daily.   pimecrolimus (ELIDEL) 1 % cream 1 application 3 (three)  times a week.   vitamin B-12 (CYANOCOBALAMIN) 500 MCG tablet Take 500 mcg by mouth daily.     Allergies:   Crestor [rosuvastatin] and Hydrocodone-chlorpheniramine   Social History   Socioeconomic History   Marital status: Married    Spouse name: Not on file   Number of children: 4   Years of education: Not on file   Highest education level: Not on file  Occupational History   Occupation: retired  Tobacco Use    Smoking status: Former    Packs/day: 1.00    Years: 30.00    Total pack years: 30.00    Types: Cigarettes    Quit date: 04/12/1998    Years since quitting: 23.7   Smokeless tobacco: Never  Vaping Use   Vaping Use: Never used  Substance and Sexual Activity   Alcohol use: Not Currently    Alcohol/week: 1.0 standard drink of alcohol    Types: 1 Cans of beer per week    Comment: OCC   Drug use: Never   Sexual activity: Never  Other Topics Concern   Not on file  Social History Narrative   He is married. Has four children   Social Determinants of Health   Financial Resource Strain: Low Risk  (02/04/2020)   Overall Financial Resource Strain (CARDIA)    Difficulty of Paying Living Expenses: Not hard at all  Food Insecurity: No Food Insecurity (09/18/2021)   Hunger Vital Sign    Worried About Running Out of Food in the Last Year: Never true    Ran Out of Food in the Last Year: Never true  Transportation Needs: No Transportation Needs (09/18/2021)   PRAPARE - Hydrologist (Medical): No    Lack of Transportation (Non-Medical): No  Physical Activity: Sufficiently Active (09/18/2021)   Exercise Vital Sign    Days of Exercise per Week: 7 days    Minutes of Exercise per Session: 120 min  Stress: No Stress Concern Present (09/18/2021)   [place]    Feeling of Stress : Not at all  Social Connections: Not on file     Family History: The patient's family history includes Heart disease in his father; Prostate cancer in his father; Stroke in his mother. There is no history of Colon cancer.  ROS:   Please see the history of present illness.    All other systems reviewed and are negative.  EKGs/Labs/Other Studies Reviewed:    The following studies were reviewed today:  January 01, 2022 ZIO monitor personally reviewed Patient had a min HR of 35 bpm, max HR of 163 bpm, and avg HR of 56 bpm.  Predominant underlying rhythm was Sinus Rhythm. Atrial Fibrillation/Flutter occurred (5% burden), ranging from 53-163 bpm (avg of 95 bpm), the longest lasting 12 hours 37 mins with  an avg rate of 94 bpm. Isolated SVEs were frequent (11.0%, [id number]), SVE Couplets were frequent (13.7%, [id number]), and SVE Triplets were occasional (2.1%, 7152). Isolated VEs were rare (<1.0%), VE Couplets were rare (<1.0%), and no VE Triplets were present.  MD notification criteria for Symptomatic Bradycardia met - notified Valinda Hoar on 31 Dec 2021 at 1:16 PM CDT Lavella Hammock).   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 Recommend starting eliquis for stroke prophylaxis and refer to EP    January 11, 2022 echo EF 55% RV normal Moderately dilated left atrium Mild MR  December 11, 2021 EKG reviewed shows sinus bradycardia with  a first-degree AV delay and frequent PACs.    Recent Labs: 09/01/2021: ALT 14 12/11/2021: BUN 14; Creatinine, Ser 0.97; Hemoglobin 13.2; Platelets 151.0; Potassium 4.4; Sodium 140; TSH 1.09  Recent Lipid Panel    Component Value Date/Time   CHOL 133 09/01/2021 0912   TRIG 64.0 09/01/2021 0912   HDL 44.90 09/01/2021 0912   CHOLHDL 3 09/01/2021 0912   VLDL 12.8 09/01/2021 0912   LDLCALC 75 09/01/2021 0912    Physical Exam:    VS:  BP 118/62   Pulse (!) 55   Ht _0  (1.803 m)   Wt 174 lb (78.9 kg)   SpO2 95%   BMI 24.27 kg/m     Wt Readings from Last 3 Encounters:  01/13/22 174 lb (78.9 kg)  12/11/21 172 lb 6.4 oz (78.2 kg)  12/03/21 170 lb (77.1 kg)     GEN:  Well nourished, well developed in no acute distress HEENT: Normal NECK: No JVD; No carotid bruits LYMPHATICS: No lymphadenopathy CARDIAC: RRR, no murmurs, rubs, gallops RESPIRATORY:  Clear to auscultation without rales, wheezing or rhonchi  ABDOMEN: Soft, non-tender, non-distended MUSCULOSKELETAL:  No edema; No deformity  SKIN: Warm and dry NEUROLOGIC:  Alert and oriented x 3 PSYCHIATRIC:   Normal affect       ASSESSMENT:    1. Paroxysmal atrial flutter (HCC)   2. Paroxysmal atrial fibrillation ([place])   3. Aortic atherosclerosis ([place])   4. Primary hypertension    PLAN:    In order of problems listed above:   #Atrial fibrillation and flutter Symptomatic.  Recent onset.  We discussed treatment options for his atrial fibrillation today including a continued/watchful waiting approach, antiarrhythmic drugs and catheter ablation.  He is very active man with a fairly bradycardic rhythm at baseline.  After our discussion the patient would like to proceed with catheter ablation which I think is very reasonable to avoid any progression of his symptoms.  Discussed treatment options today for his AF including antiarrhythmic drug therapy and ablation. Discussed risks, recovery and likelihood of success. Discussed potential need for repeat ablation procedures and antiarrhythmic drugs after an initial ablation. They wish to proceed with scheduling.  Risk, benefits, and alternatives to EP study and radiofrequency ablation for afib were also discussed in detail today. These risks include but are not limited to stroke, bleeding, vascular damage, tamponade, perforation, damage to the esophagus, lungs, and other structures, pulmonary vein stenosis, worsening renal function, and death. The patient understands these risk and wishes to proceed.  We will therefore proceed with catheter ablation at the next available time.  Carto, ICE, anesthesia are requested for the procedure.  Will also obtain CT PV protocol prior to the procedure to exclude LAA thrombus and further evaluate atrial anatomy.   CHA2DS2-VASc Score = 4  The patient's score is based upon: CHF History: 0 HTN History: 1 Diabetes History: 0 Stroke History: 0 Vascular Disease History: 1 Age Score: 2 Gender Score: 0  #Hypertension At goal today.  Recommend checking blood pressures 1-2 times per week at home and recording the values.   Recommend bringing these recordings to the primary care physician.     Medication Adjustments/Labs and Tests Ordered: Current medicines are reviewed at length with the patient today.  Concerns regarding medicines are outlined above.  No orders of the defined types were placed in this encounter.  No orders of the defined types were placed in this encounter.    Signed, Hilton Cork. Quentin Ore, MD, [facility], [facility] 01/13/2022 1:57  PM    Electrophysiology [facility]

## 2022-01-13 ENCOUNTER — Ambulatory Visit: Payer: Medicare Other | Attending: Cardiology | Admitting: Cardiology

## 2022-01-13 ENCOUNTER — Encounter: Payer: Self-pay | Admitting: Cardiology

## 2022-01-13 VITALS — BP 118/62 | HR 55 | Ht 71.0 in | Wt 174.0 lb

## 2022-01-13 DIAGNOSIS — I480 Paroxysmal atrial fibrillation: Secondary | ICD-10-CM | POA: Diagnosis not present

## 2022-01-13 DIAGNOSIS — D696 Thrombocytopenia, unspecified: Secondary | ICD-10-CM | POA: Insufficient documentation

## 2022-01-13 DIAGNOSIS — I10 Essential (primary) hypertension: Secondary | ICD-10-CM | POA: Diagnosis not present

## 2022-01-13 DIAGNOSIS — I700 Atherosclerosis of aorta: Secondary | ICD-10-CM | POA: Diagnosis not present

## 2022-01-13 DIAGNOSIS — I4892 Unspecified atrial flutter: Secondary | ICD-10-CM

## 2022-01-13 NOTE — Patient Instructions (Signed)
Medication Instructions:  None  *If you need a refill on your cardiac medications before your next appointment, please call your pharmacy*   Lab Work: 3 weeks prior to procedure If you have labs (blood work) drawn today and your tests are completely normal, you will receive your results only by: [place] (if you have MyChart) OR A paper copy in the mail If you have any lab test that is abnormal or we need to change your treatment, we will call you to review the results.   Testing/Procedures: Your physician has requested that you have cardiac CT. Cardiac computed tomography (CT) is a painless test that uses an x-ray machine to take clear, detailed pictures of your heart. For further information please visit [email or web address]. Please follow instruction sheet as given.  Your physician has recommended that you have an ablation. Catheter ablation is a medical procedure used to treat some cardiac arrhythmias (irregular heartbeats). During catheter ablation, a long, thin, flexible tube is put into a blood vessel in your groin (upper thigh), or neck. This tube is called an ablation catheter. It is then guided to your heart through the blood vessel. Radio frequency waves destroy small areas of heart tissue where abnormal heartbeats may cause an arrhythmia to start. Please see the instruction sheet given to you today.   Follow-Up: At [facility], you and your health needs are our priority.  As part of our continuing mission to provide you with exceptional heart care, we have created designated Provider Care Teams.  These Care Teams include your primary Cardiologist (physician) and Advanced Practice Providers (APPs -  Physician Assistants and Nurse Practitioners) who all work together to provide you with the care you need, when you need it.  We recommend signing up for the patient portal called "MyChart".  Sign up information is provided on this After Visit Summary.  MyChart is used to  connect with patients for Virtual Visits (Telemedicine).  Patients are able to view lab/test results, encounter notes, upcoming appointments, etc.  Non-urgent messages can be sent to your provider as well.   To learn more about what you can do with MyChart, go to [email or web address].    Your next appointment:   See instruction letter.  Important Information About Sugar

## 2022-01-25 ENCOUNTER — Other Ambulatory Visit: Payer: Self-pay

## 2022-01-25 DIAGNOSIS — I4892 Unspecified atrial flutter: Secondary | ICD-10-CM

## 2022-01-25 MED ORDER — APIXABAN 5 MG PO TABS: 5.0000 mg | ORAL_TABLET | Freq: Two times a day (BID) | ORAL | 5 refills | Status: DC

## 2022-02-04 ENCOUNTER — Ambulatory Visit: Payer: Medicare Other | Admitting: Cardiology

## 2022-02-08 ENCOUNTER — Encounter (INDEPENDENT_AMBULATORY_CARE_PROVIDER_SITE_OTHER): Payer: Self-pay

## 2022-02-17 ENCOUNTER — Encounter: Payer: Self-pay | Admitting: Cardiology

## 2022-03-05 ENCOUNTER — Other Ambulatory Visit
Admission: RE | Admit: 2022-03-05 | Discharge: 2022-03-05 | Disposition: A | Discharge status: Home / Self Care | Payer: Medicare Other | Source: Ambulatory Visit | Attending: Cardiology | Admitting: Cardiology

## 2022-03-05 DIAGNOSIS — I10 Essential (primary) hypertension: Secondary | ICD-10-CM | POA: Insufficient documentation

## 2022-03-05 DIAGNOSIS — I4892 Unspecified atrial flutter: Secondary | ICD-10-CM | POA: Insufficient documentation

## 2022-03-05 DIAGNOSIS — I700 Atherosclerosis of aorta: Secondary | ICD-10-CM | POA: Diagnosis not present

## 2022-03-05 DIAGNOSIS — I480 Paroxysmal atrial fibrillation: Secondary | ICD-10-CM | POA: Diagnosis not present

## 2022-03-05 LAB — CBC WITH DIFFERENTIAL/PLATELET
Abs Immature Granulocytes: 0.03 10*3/uL (ref 0.00–0.07)
Basophils Absolute: 0.1 10*3/uL (ref 0.0–0.1)
Basophils Relative: 1 %
Eosinophils Absolute: 0.2 10*3/uL (ref 0.0–0.5)
Eosinophils Relative: 3 %
HCT: 41.2 % (ref 39.0–52.0)
Hemoglobin: 13.8 g/dL (ref 13.0–17.0)
Immature Granulocytes: 1 %
Lymphocytes Relative: 31 %
Lymphs Abs: 1.9 10*3/uL (ref 0.7–4.0)
MCH: 29.9 pg (ref 26.0–34.0)
MCHC: 33.5 g/dL (ref 30.0–36.0)
MCV: 89.4 fL (ref 80.0–100.0)
Monocytes Absolute: 0.7 10*3/uL (ref 0.1–1.0)
Monocytes Relative: 11 %
Neutro Abs: 3.3 10*3/uL (ref 1.7–7.7)
Neutrophils Relative %: 53 %
Platelets: 164 10*3/uL (ref 150–400)
RBC: 4.61 MIL/uL (ref 4.22–5.81)
RDW: 13.4 % (ref 11.5–15.5)
WBC: 6.2 10*3/uL (ref 4.0–10.5)
nRBC: 0 % (ref 0.0–0.2)

## 2022-03-05 LAB — BASIC METABOLIC PANEL
Anion gap: 6 (ref 5–15)
BUN: 19 mg/dL (ref 8–23)
CO2: 29 mmol/L (ref 22–32)
Calcium: 9.4 mg/dL (ref 8.9–10.3)
Chloride: 105 mmol/L (ref 98–111)
Creatinine, Ser: 0.97 mg/dL (ref 0.61–1.24)
GFR, Estimated: >60 mL/min (ref >60)
Glucose, Bld: 109 mg/dL — ABNORMAL HIGH (ref 70–99)
Potassium: 4.5 mmol/L (ref 3.5–5.1)
Sodium: 140 mmol/L (ref 135–145)

## 2022-03-10 ENCOUNTER — Encounter: Payer: Self-pay | Admitting: Internal Medicine

## 2022-03-10 ENCOUNTER — Ambulatory Visit (INDEPENDENT_AMBULATORY_CARE_PROVIDER_SITE_OTHER): Payer: Medicare Other | Admitting: Internal Medicine

## 2022-03-10 ENCOUNTER — Other Ambulatory Visit: Payer: Medicare Other

## 2022-03-10 VITALS — BP 126/76 | HR 55 | Temp 98.0°F | Resp 15 | Ht 72.0 in | Wt 169.4 lb

## 2022-03-10 DIAGNOSIS — R102 Pelvic and perineal pain: Secondary | ICD-10-CM

## 2022-03-10 DIAGNOSIS — I10 Essential (primary) hypertension: Secondary | ICD-10-CM | POA: Diagnosis not present

## 2022-03-10 DIAGNOSIS — E041 Nontoxic single thyroid nodule: Secondary | ICD-10-CM

## 2022-03-10 DIAGNOSIS — R739 Hyperglycemia, unspecified: Secondary | ICD-10-CM | POA: Diagnosis not present

## 2022-03-10 DIAGNOSIS — E7800 Pure hypercholesterolemia, unspecified: Secondary | ICD-10-CM

## 2022-03-10 DIAGNOSIS — Z125 Encounter for screening for malignant neoplasm of prostate: Secondary | ICD-10-CM

## 2022-03-10 DIAGNOSIS — Z0000 Encounter for general adult medical examination without abnormal findings: Secondary | ICD-10-CM

## 2022-03-10 DIAGNOSIS — I723 Aneurysm of iliac artery: Secondary | ICD-10-CM

## 2022-03-10 DIAGNOSIS — I700 Atherosclerosis of aorta: Secondary | ICD-10-CM

## 2022-03-10 DIAGNOSIS — I2510 Atherosclerotic heart disease of native coronary artery without angina pectoris: Secondary | ICD-10-CM

## 2022-03-10 DIAGNOSIS — Z8551 Personal history of malignant neoplasm of bladder: Secondary | ICD-10-CM

## 2022-03-10 LAB — HEPATIC FUNCTION PANEL
ALT: 15 U/L (ref 0–53)
AST: 18 U/L (ref 0–37)
Albumin: 4.4 g/dL (ref 3.5–5.2)
Alkaline Phosphatase: 56 U/L (ref 39–117)
Bilirubin, Direct: 0.2 mg/dL (ref 0.0–0.3)
Total Bilirubin: 0.7 mg/dL (ref 0.2–1.2)
Total Protein: 7.3 g/dL (ref 6.0–8.3)

## 2022-03-10 LAB — BASIC METABOLIC PANEL
BUN: 17 mg/dL (ref 6–23)
CO2: 31 mEq/L (ref 19–32)
Calcium: 9.8 mg/dL (ref 8.4–10.5)
Chloride: 102 mEq/L (ref 96–112)
Creatinine, Ser: 1.01 mg/dL (ref 0.40–1.50)
GFR: 71.74 mL/min (ref >60.00)
Glucose, Bld: 97 mg/dL (ref 70–99)
Potassium: 4.6 mEq/L (ref 3.5–5.1)
Sodium: 139 mEq/L (ref 135–145)

## 2022-03-10 LAB — LIPID PANEL
Cholesterol: 140 mg/dL (ref 0–200)
HDL: 50.9 mg/dL (ref >39.00)
LDL Cholesterol: 75 mg/dL (ref 0–99)
NonHDL: 88.64
Total CHOL/HDL Ratio: 3
Triglycerides: 70 mg/dL (ref 0.0–149.0)
VLDL: 14 mg/dL (ref 0.0–40.0)

## 2022-03-10 LAB — URINALYSIS, ROUTINE W REFLEX MICROSCOPIC
Bilirubin Urine: NEGATIVE
Ketones, ur: NEGATIVE
Leukocytes,Ua: NEGATIVE
Nitrite: NEGATIVE
Specific Gravity, Urine: <=1.005 — AB (ref 1.000–1.030)
Total Protein, Urine: NEGATIVE
Urine Glucose: NEGATIVE
Urobilinogen, UA: 0.2 (ref 0.0–1.0)
pH: 6 (ref 5.0–8.0)

## 2022-03-10 LAB — HEMOGLOBIN A1C: Hgb A1c MFr Bld: 5.9 % (ref 4.6–6.5)

## 2022-03-10 LAB — PSA, MEDICARE: PSA: 0.22 ng/ml (ref 0.10–4.00)

## 2022-03-10 NOTE — Progress Notes (Signed)
Patient ID: Nathan Watts, male   DOB: 02-13-1946, 76 y.o.   MRN: [id number]   Subjective:    Patient ID: Nathan Watts, male    DOB: 17-Apr-1945, 76 y.o.   MRN: [id number]   Patient here for  Chief Complaint  Patient presents with   Annual Exam    cpe   .   HPI Here for physical exam. Recent diagnosis of afib.  Planning ablation.  Stays active.  Walking.  No chest pain or sob reported.  No increased cough or congestion.  No acid reflux reported.  Does report over the last 2-3 weeks  - pain lower abdomen into left testicle.  Urinating ok.  No dysuria.  Bowels moving.  Reports handling stress.     Past Medical History:  Diagnosis Date   Anemia, unspecified    BPH (benign prostatic hyperplasia)    Fatty infiltration of liver    GERD (gastroesophageal reflux disease)    Hypercholesterolemia    Hyperglycemia    Hypertension    Thrombocytopenia (HCC)    Thyroid nodule    Past Surgical History:  Procedure Laterality Date   COLONOSCOPY WITH PROPOFOL N/A 10/02/2014   Procedure: COLONOSCOPY WITH PROPOFOL;  Surgeon: Manya Silvas, MD;  Location: [place];  Service: Endoscopy;  Laterality: N/A;   COLONOSCOPY WITH PROPOFOL N/A 06/15/2021   Procedure: COLONOSCOPY WITH PROPOFOL;  Surgeon: Lesly Rubenstein, MD;  Location: [facility];  Service: Endoscopy;  Laterality: N/A;   ESOPHAGOGASTRODUODENOSCOPY (EGD) WITH PROPOFOL N/A 06/15/2021   Procedure: ESOPHAGOGASTRODUODENOSCOPY (EGD) WITH PROPOFOL;  Surgeon: Lesly Rubenstein, MD;  Location: [facility];  Service: Endoscopy;  Laterality: N/A;   HOLEP-LASER ENUCLEATION OF THE PROSTATE WITH MORCELLATION N/A 04/24/2021   Procedure: HOLEP-LASER ENUCLEATION OF THE PROSTATE WITH MORCELLATION;  Surgeon: Billey Co, MD;  Location: [facility];  Service: Urology;  Laterality: N/A;   INGUINAL HERNIA REPAIR     TONSILLECTOMY     TRANSURETHRAL RESECTION OF BLADDER TUMOR WITH MITOMYCIN-C N/A 04/24/2021   Procedure: TRANSURETHRAL  RESECTION OF BLADDER TUMOR WITH gemcitabine;  Surgeon: Billey Co, MD;  Location: [facility];  Service: Urology;  Laterality: N/A;   Family History  Problem Relation Age of Onset   Prostate cancer Father    Heart disease Father        s/p CABG   Stroke Mother    Colon cancer Neg Hx    Social History   Socioeconomic History   Marital status: Married    Spouse name: Not on file   Number of children: 4   Years of education: Not on file   Highest education level: Not on file  Occupational History   Occupation: retired  Tobacco Use   Smoking status: Former    Packs/day: 1.00    Years: 30.00    Total pack years: 30.00    Types: Cigarettes    Quit date: 04/12/1998    Years since quitting: 23.9    Passive exposure: Past   Smokeless tobacco: Never  Vaping Use   Vaping Use: Never used  Substance and Sexual Activity   Alcohol use: Not Currently    Alcohol/week: 1.0 standard drink of alcohol    Types: 1 Cans of beer per week    Comment: OCC   Drug use: Never   Sexual activity: Never  Other Topics Concern   Not on file  Social History Narrative   He is married. Has four children   Social Determinants of Health  Financial Resource Strain: Low Risk  (02/04/2020)   Overall Financial Resource Strain (CARDIA)    Difficulty of Paying Living Expenses: Not hard at all  Food Insecurity: No Food Insecurity (09/18/2021)   Hunger Vital Sign    Worried About Running Out of Food in the Last Year: Never true    Ran Out of Food in the Last Year: Never true  Transportation Needs: No Transportation Needs (09/18/2021)   PRAPARE - Hydrologist (Medical): No    Lack of Transportation (Non-Medical): No  Physical Activity: Sufficiently Active (09/18/2021)   Exercise Vital Sign    Days of Exercise per Week: 7 days    Minutes of Exercise per Session: 120 min  Stress: No Stress Concern Present (09/18/2021)   [place]    Feeling of Stress : Not at all  Social Connections: Not on file     Review of Systems  Constitutional:  Negative for appetite change and unexpected weight change.  HENT:  Negative for congestion, sinus pressure and sore throat.   Eyes:  Negative for pain and visual disturbance.  Respiratory:  Negative for cough, chest tightness and shortness of breath.   Cardiovascular:  Negative for chest pain, palpitations and leg swelling.  Gastrointestinal:  Negative for diarrhea, nausea and vomiting.       Left lower abdominal pain - into left testicle.   Genitourinary:  Negative for difficulty urinating and dysuria.  Musculoskeletal:  Negative for joint swelling and myalgias.  Skin:  Negative for color change and rash.  Neurological:  Negative for dizziness and headaches.  Hematological:  Negative for adenopathy. Does not bruise/bleed easily.  Psychiatric/Behavioral:  Negative for agitation and dysphoric mood.        Objective:     BP 126/76 (BP Location: Left Arm, Patient Position: Sitting, Cuff Size: Small)   Pulse (!) 55   Temp 98 F (36.7 C) (Temporal)   Resp 15   Ht 6' (1.829 m)   Wt 169 lb 6.4 oz (76.8 kg)   SpO2 97%   BMI 22.97 kg/m  Wt Readings from Last 3 Encounters:  03/11/22 169 lb (76.7 kg)  03/10/22 169 lb 6.4 oz (76.8 kg)  01/13/22 174 lb (78.9 kg)    Physical Exam Constitutional:      General: He is not in acute distress.    Appearance: Normal appearance. He is well-developed.  HENT:     Head: Normocephalic and atraumatic.     Right Ear: External ear normal.     Left Ear: External ear normal.  Eyes:     General: No scleral icterus.       Right eye: No discharge.        Left eye: No discharge.     Conjunctiva/sclera: Conjunctivae normal.  Neck:     Thyroid: No thyromegaly.  Cardiovascular:     Rate and Rhythm: Normal rate and regular rhythm.  Pulmonary:     Effort: No respiratory distress.     Breath sounds: Normal breath sounds. No  wheezing.  Abdominal:     General: Bowel sounds are normal.     Palpations: Abdomen is soft.     Tenderness: There is no abdominal tenderness.  Genitourinary:    Comments: Minimal pain to palpation - suprapubic pressure - no significant pain to palpation - left testicle.   Musculoskeletal:        General: No swelling or tenderness.  Cervical back: Neck supple. No tenderness.  Lymphadenopathy:     Cervical: No cervical adenopathy.  Skin:    Findings: No erythema or rash.  Neurological:     Mental Status: He is alert and oriented to person, place, and time.  Psychiatric:        Mood and Affect: Mood normal.        Behavior: Behavior normal.      Outpatient Encounter Medications as of 03/10/2022  Medication Sig   apixaban (ELIQUIS) 5 MG TABS tablet Take 1 tablet (5 mg total) by mouth 2 (two) times daily.   atorvastatin (LIPITOR) 20 MG tablet TAKE ONE TABLET BY MOUTH DAILY   Fiber POWD Take 15 mLs by mouth daily.   Lactobacillus (PROBIOTIC ACIDOPHILUS PO) Take by mouth daily.   metroNIDAZOLE (METROGEL) 0.75 % gel Apply 1 application topically 2 (two) times a week. to face   Multiple Vitamin (MULTI VITAMIN MENS PO) Take 1 tablet by mouth daily.   Omega-3 Fatty Acids (FISH OIL) 1200 MG CAPS Take 1 capsule by mouth daily.   Omeprazole Magnesium (PRILOSEC OTC PO) Take by mouth daily.   pimecrolimus (ELIDEL) 1 % cream 1 application 3 (three) times a week.   vitamin B-12 (CYANOCOBALAMIN) 500 MCG tablet Take 500 mcg by mouth daily.   No facility-administered encounter medications on file as of 03/10/2022.     Lab Results  Component Value Date   WBC 6.2 03/05/2022   HGB 13.8 03/05/2022   HCT 41.2 03/05/2022   PLT 164 03/05/2022   GLUCOSE 97 03/10/2022   CHOL 140 03/10/2022   TRIG 70.0 03/10/2022   HDL 50.90 03/10/2022   LDLCALC 75 03/10/2022   ALT 15 03/10/2022   AST 18 03/10/2022   NA 139 03/10/2022   K 4.6 03/10/2022   CL 102 03/10/2022   CREATININE 1.01 03/10/2022    BUN 17 03/10/2022   CO2 31 03/10/2022   TSH 1.09 12/11/2021   PSA 0.22 03/10/2022   HGBA1C 5.9 03/10/2022       Assessment & Plan:   Problem List Items Addressed This Visit     Aortic atherosclerosis ([place])    Continue lipitor.        Common iliac aneurysm (HCC)    Sees Dr Lucky Cowboy.  Last evaluated 09/16/2020.  Slight growth (2.9cm) in maximal diameter (aorta).  Right common iliac artery 1.7cm.  Left 1.8cm.  Recommended f/u every other year.        Coronary artery disease    Continue risk factor modification.  Remain on aspirin - low dose.  Stable.       Health care maintenance    Physical today 03/10/22.  Colonoscopy 06/15/21 - The examined portion of the ileum was normal. Small lipoma in the ascending colon. Diverticulosis in the sigmoid colon and in the descending colon. Internal hemorrhoids. Check psa today.       History of bladder cancer    Dr Diamantina Providence 12/03/21 - cystoscopy - no evidence of recurrence.  Plan f/u cystoscopy in 6 months.      Hypercholesterolemia    On lipitor.  Low cholesterol diet and exercise.  Follow lipid panel and liver function tests.        Relevant Orders   Hepatic function panel (Completed)   Lipid panel (Completed)   Hyperglycemia    Low carb diet and exercise.  Follow met b and a1c.       Relevant Orders   Hemoglobin A1c (Completed)   Hypertension, essential  Blood pressure is doing well on no medication.  Follow pressures.  Follow metabolic panel.        Relevant Orders   Basic metabolic panel (Completed)   Suprapubic pain    Reported suprapubic pain and pain into left testicle.  No known injury.  No hernia appreciated on exam.  No pain to palpation of testicle. Check urine.  Discussed f/u with urology.        Relevant Orders   Urinalysis, Routine w reflex microscopic (Completed)   Urine Culture (Completed)   Thyroid nodule    Worked up previously by Dr Eddie Dibbles.  Previous biopsy negative.  Last check stable.  Recommended f/u prn.  Follow  thyroid function tests.       Other Visit Diagnoses     Routine general medical examination at a health care facility    -  Primary   Prostate cancer screening       Relevant Orders   PSA, Medicare (Completed)        Einar Pheasant, MD

## 2022-03-10 NOTE — Assessment & Plan Note (Signed)
Physical today 03/10/22.  Colonoscopy 06/15/21 - The examined portion of the ileum was normal. Small lipoma in the ascending colon. Diverticulosis in the sigmoid colon and in the descending colon. Internal hemorrhoids. Check psa today.

## 2022-03-11 ENCOUNTER — Ambulatory Visit: Payer: Medicare Other | Admitting: Urology

## 2022-03-11 ENCOUNTER — Encounter: Payer: Self-pay | Admitting: Urology

## 2022-03-11 VITALS — Ht 72.0 in | Wt 169.0 lb

## 2022-03-11 DIAGNOSIS — Z87438 Personal history of other diseases of male genital organs: Secondary | ICD-10-CM

## 2022-03-11 DIAGNOSIS — R319 Hematuria, unspecified: Secondary | ICD-10-CM | POA: Diagnosis not present

## 2022-03-11 DIAGNOSIS — Z8551 Personal history of malignant neoplasm of bladder: Secondary | ICD-10-CM

## 2022-03-11 DIAGNOSIS — H2513 Age-related nuclear cataract, bilateral: Secondary | ICD-10-CM | POA: Diagnosis not present

## 2022-03-11 DIAGNOSIS — N50812 Left testicular pain: Secondary | ICD-10-CM | POA: Diagnosis not present

## 2022-03-11 DIAGNOSIS — N453 Epididymo-orchitis: Secondary | ICD-10-CM

## 2022-03-11 LAB — URINALYSIS, COMPLETE
Bilirubin, UA: NEGATIVE
Glucose, UA: NEGATIVE
Ketones, UA: NEGATIVE
Leukocytes,UA: NEGATIVE
Nitrite, UA: NEGATIVE
Protein,UA: NEGATIVE
Specific Gravity, UA: <1.005 — ABNORMAL LOW (ref 1.005–1.030)
Urobilinogen, Ur: 0.2 mg/dL (ref 0.2–1.0)
pH, UA: 5.5 (ref 5.0–7.5)

## 2022-03-11 LAB — BLADDER SCAN AMB NON-IMAGING

## 2022-03-11 LAB — MICROSCOPIC EXAMINATION: Bacteria, UA: NONE SEEN

## 2022-03-11 LAB — URINE CULTURE
MICRO NUMBER:: 14246122
Result:: NO GROWTH
SPECIMEN QUALITY:: ADEQUATE

## 2022-03-11 MED ORDER — SULFAMETHOXAZOLE-TRIMETHOPRIM 800-160 MG PO TABS: 1.0000 | ORAL_TABLET | Freq: Two times a day (BID) | ORAL | 0 refills | Status: AC

## 2022-03-11 NOTE — Patient Instructions (Signed)
Epididymitis  Epididymitis is inflammation or swelling of the epididymis. This is caused by an infection. The epididymis is a cord-like structure that is located along the top and back part of the testicle. It collects and stores sperm from the testicle. This condition can also cause pain and swelling of the testicle and scrotum. Symptoms usually start suddenly (acute epididymitis). Sometimes epididymitis starts gradually and lasts for a while (chronic epididymitis). Chronic epididymitis may be harder to treat. What are the causes? In men ages 20-40, this condition is usually caused by a bacterial infection or a sexually transmitted infection (STI), such as gonorrhea or chlamydia. In men 40 and older, this condition is usually caused by bacteria from a urinary blockage or from abnormalities in the urinary system. These can result from: Having a tube placed into the bladder (urinary catheter). Having an enlarged or inflamed prostate gland. Having recently had urinary tract surgery. Having a problem with a backward flow of urine (retrograde). In men who have a condition that weakens the body's defense system (immune system), such as human immunodeficiency virus (HIV), this condition can be caused by: Other bacteria, including tuberculosis and syphilis. Viruses. Fungi. Sometimes this condition occurs without infection. This may happen because of trauma or repetitive activities such as sports. What increases the risk? You are more likely to develop this condition if you have: Unprotected sex with more than one partner. Anal sex. Had recent surgery. A urinary catheter. Urinary problems. A suppressed immune system. What are the signs or symptoms? This condition usually begins suddenly with chills, fever, and pain behind the scrotum and in the testicle. Other symptoms include: Swelling of the scrotum, testicle, or both. Pain when ejaculating or urinating. Pain in the back or  abdomen. Nausea. Itching and discharge from the penis. A frequent need to pass urine. Redness, increased warmth, and tenderness of the scrotum. How is this diagnosed? Your health care provider can diagnose this condition based on your symptoms and medical history. Your health care provider will also do a physical exam to check your scrotum and testicle for swelling, pain, and redness. You may also have other tests, including: Testing of discharge from the penis. Testing your urine for infections, such as STIs. Ultrasound to check for blood flow and inflammation. Your health care provider may test you for other STIs, including HIV. How is this treated? Treatment for this condition depends on the cause. If your condition is caused by a bacterial infection, oral antibiotic medicine may be prescribed. If the bacterial infection has spread to your blood, you may need to receive IV antibiotics. For both bacterial and nonbacterial epididymitis, you may be treated with: Rest. Elevation of the scrotum. Pain medicines. Anti-inflammatory medicines. Surgery may be needed if: You have pus buildup in the scrotum (abscess). You have epididymitis that has not responded to other treatments. Follow these instructions at home: Medicines Take over-the-counter and prescription medicines only as told by your health care provider. If you were prescribed an antibiotic medicine, take it as told by your health care provider. Do not stop taking the antibiotic even if your condition improves. Sexual activity If your epididymitis was caused by an STI, avoid sexual activity until your treatment is complete. Inform your sexual partner or partners if you test positive for an STI. They may need to be treated. Do not engage in sexual activity with your partner or partners until their treatment is completed. Managing pain and swelling  If directed, raise (elevate) your scrotum and apply ice.   To do this: Put ice in a  plastic bag. Place a small towel or pillow between your legs. Rest your scrotum on the pillow or towel. Place another towel between your skin and the plastic bag. Leave the ice on for 20 minutes, 2-3 times a day. Remove the ice if your skin turns bright red. This is very important. If you cannot feel pain, heat, or cold, you have a greater risk of damage to the area. Keep your scrotum elevated and supported while resting. Ask your health care provider if you should wear a scrotal support, such as a jockstrap. Wear it as told by your health care provider. Try taking a sitz bath to help with discomfort. This is a warm water bath that is taken while you are sitting down. The water should come up to your hips and should cover your buttocks. Do this 3-4 times per day or as told by your health care provider. General instructions Drink enough fluid to keep your urine pale yellow. Return to your normal activities as told by your health care provider. Ask your health care provider what activities are safe for you. Keep all follow-up visits. This is important. Contact a health care provider if: You have a fever. Your pain medicine is not helping. Your pain is getting worse. Your symptoms do not improve within 3 days. Summary Epididymitis is inflammation or swelling of the epididymis. This is caused by an infection. This condition can also cause pain and swelling of the testicle and scrotum. Treatment for this condition depends on the cause. If your condition is caused by a bacterial infection, oral antibiotic medicine may be prescribed. Inform your sexual partner or partners if you test positive for an STI. They may need to be treated. Do not engage in sexual activity with your partner or partners until their treatment is completed. Contact a health care provider if your symptoms do not improve within 3 days. This information is not intended to replace advice given to you by your health care provider.  Make sure you discuss any questions you have with your health care provider. Document Revised: 11/05/2020 Document Reviewed: 11/05/2020 Elsevier Patient Education  2023 Elsevier Inc.  

## 2022-03-11 NOTE — Progress Notes (Signed)
   03/11/2022 10:34 AM   [place] 07/09/1945 [id number]  Reason for visit: Left testicular pain, microscopic hematuria, history of BPH treated with HOLEP, history of noninvasive bladder cancer  HPI: 76 year old male who presented with BPH symptoms of straining, frequency, sensation of incomplete emptying and bothersome LUTS and opted for HOLEP.  He was also found to have a approximately 2 cm papillary bladder tumor at that time.  He underwent simultaneous TURBT(path HG Ta) and HOLEP(path benign prostate tissue) in January 2023.  He has done very well since that time, with significant improvement in his urinary symptoms.  PSA dropped appropriately postoperatively to 0.22 which was checked yesterday with PCP.  Most recent surveillance cystoscopy and August 2023 showed no evidence of recurrence and a wide open prostatic fossa.  He opted for repeat cystoscopy in 6 months for ongoing surveillance, then consider moving to yearly surveillance for his history of noninvasive bladder cancer.  He presented to his PCP yesterday with about 2 weeks of left-sided testicular pain.  He originally felt this was a groin strain, but feels like this has not improved as he would have expected.  He also had 3-6 RBCs on urinalysis, and he was referred back to urology for an urgent visit.  On exam he does have some mild to moderate left-sided epididymal tenderness, and this may represent epididymitis.  Urinalysis and urine culture is pending today.  Recommend a 7-day course of Bactrim DS twice daily for possible left epididymitis, follow-up cultures Keep follow-up as scheduled February 2024 for surveillance cystoscopy   Billey Co, MD  [facility] [address] [place], [place] [address] [phone]

## 2022-03-15 ENCOUNTER — Encounter: Payer: Self-pay | Admitting: Internal Medicine

## 2022-03-15 NOTE — Assessment & Plan Note (Signed)
Continue lipitor  ?

## 2022-03-15 NOTE — Assessment & Plan Note (Signed)
Blood pressure is doing well on no medication.  Follow pressures.  Follow metabolic panel.

## 2022-03-15 NOTE — Assessment & Plan Note (Signed)
Sees Dr Lucky Cowboy.  Last evaluated 09/16/2020.  Slight growth (2.9cm) in maximal diameter (aorta).  Right common iliac artery 1.7cm.  Left 1.8cm.  Recommended f/u every other year.

## 2022-03-15 NOTE — Assessment & Plan Note (Signed)
Worked up previously by Dr Eddie Dibbles.  Previous biopsy negative.  Last check stable.  Recommended f/u prn.  Follow thyroid function tests.

## 2022-03-15 NOTE — Assessment & Plan Note (Signed)
On lipitor.  Low cholesterol diet and exercise.  Follow lipid panel and liver function tests.   

## 2022-03-15 NOTE — Assessment & Plan Note (Signed)
Dr Diamantina Providence 12/03/21 - cystoscopy - no evidence of recurrence.  Plan f/u cystoscopy in 6 months.

## 2022-03-15 NOTE — Assessment & Plan Note (Signed)
Reported suprapubic pain and pain into left testicle.  No known injury.  No hernia appreciated on exam.  No pain to palpation of testicle. Check urine.  Discussed f/u with urology.

## 2022-03-15 NOTE — Assessment & Plan Note (Signed)
Continue risk factor modification.  Remain on aspirin - low dose.  Stable.

## 2022-03-15 NOTE — Assessment & Plan Note (Signed)
Low carb diet and exercise.  Follow met b and a1c.  

## 2022-03-16 LAB — CULTURE, URINE COMPREHENSIVE

## 2022-03-18 ENCOUNTER — Telehealth (HOSPITAL_COMMUNITY): Payer: Self-pay | Admitting: *Deleted

## 2022-03-18 NOTE — Telephone Encounter (Signed)
Reaching out to patient to offer assistance regarding upcoming cardiac imaging study; pt verbalizes understanding of appt date/time, parking situation and where to check in, pre-test NPO status  and verified current allergies; name and call back number provided for further questions should they arise ? ?Jahad Old RN Navigator Cardiac Imaging ?[facility] ?[phone] office ?[phone] cell ? ?

## 2022-03-19 ENCOUNTER — Ambulatory Visit
Admission: RE | Admit: 2022-03-19 | Discharge: 2022-03-19 | Disposition: A | Discharge status: Home / Self Care | Payer: Medicare Other | Source: Ambulatory Visit | Attending: Cardiology | Admitting: Cardiology

## 2022-03-19 DIAGNOSIS — I480 Paroxysmal atrial fibrillation: Secondary | ICD-10-CM

## 2022-03-19 DIAGNOSIS — I10 Essential (primary) hypertension: Secondary | ICD-10-CM

## 2022-03-19 DIAGNOSIS — I4892 Unspecified atrial flutter: Secondary | ICD-10-CM | POA: Diagnosis not present

## 2022-03-19 DIAGNOSIS — I700 Atherosclerosis of aorta: Secondary | ICD-10-CM | POA: Diagnosis not present

## 2022-03-19 MED ORDER — IOHEXOL 350 MG/ML SOLN
75.0000 mL | Freq: Once | INTRAVENOUS | Status: AC | PRN
  Administered 2022-03-19: 75 mL via INTRAVENOUS

## 2022-03-25 NOTE — Pre-Procedure Instructions (Signed)
Instructed patient on the following items: Arrival time 0830 Nothing to eat or drink after midnight No meds AM of procedure Responsible person to drive you home and stay with you for 24 hrs  Have you missed any doses of anti-coagulant Eliquis- hasn't missed any doses   

## 2022-03-26 ENCOUNTER — Other Ambulatory Visit (HOSPITAL_COMMUNITY): Payer: Self-pay

## 2022-03-26 ENCOUNTER — Ambulatory Visit (HOSPITAL_COMMUNITY): Payer: Medicare Other | Admitting: Anesthesiology

## 2022-03-26 ENCOUNTER — Encounter (HOSPITAL_COMMUNITY): Payer: Self-pay | Admitting: Cardiology

## 2022-03-26 ENCOUNTER — Encounter (HOSPITAL_COMMUNITY)
Admission: RE | Disposition: A | Discharge status: Home / Self Care | Payer: Self-pay | Source: Home / Self Care | Attending: Cardiology

## 2022-03-26 ENCOUNTER — Ambulatory Visit (HOSPITAL_BASED_OUTPATIENT_CLINIC_OR_DEPARTMENT_OTHER): Payer: Medicare Other | Admitting: Anesthesiology

## 2022-03-26 ENCOUNTER — Ambulatory Visit (HOSPITAL_COMMUNITY)
Admission: RE | Admit: 2022-03-26 | Discharge: 2022-03-26 | Disposition: A | Discharge status: Home / Self Care | Payer: Medicare Other | Attending: Cardiology | Admitting: Cardiology

## 2022-03-26 ENCOUNTER — Other Ambulatory Visit: Payer: Self-pay

## 2022-03-26 DIAGNOSIS — K219 Gastro-esophageal reflux disease without esophagitis: Secondary | ICD-10-CM | POA: Diagnosis not present

## 2022-03-26 DIAGNOSIS — I10 Essential (primary) hypertension: Secondary | ICD-10-CM | POA: Insufficient documentation

## 2022-03-26 DIAGNOSIS — I340 Nonrheumatic mitral (valve) insufficiency: Secondary | ICD-10-CM

## 2022-03-26 DIAGNOSIS — E785 Hyperlipidemia, unspecified: Secondary | ICD-10-CM | POA: Insufficient documentation

## 2022-03-26 DIAGNOSIS — I700 Atherosclerosis of aorta: Secondary | ICD-10-CM | POA: Insufficient documentation

## 2022-03-26 DIAGNOSIS — I4891 Unspecified atrial fibrillation: Secondary | ICD-10-CM | POA: Insufficient documentation

## 2022-03-26 DIAGNOSIS — Z87891 Personal history of nicotine dependence: Secondary | ICD-10-CM

## 2022-03-26 DIAGNOSIS — I483 Typical atrial flutter: Secondary | ICD-10-CM | POA: Insufficient documentation

## 2022-03-26 DIAGNOSIS — Z8249 Family history of ischemic heart disease and other diseases of the circulatory system: Secondary | ICD-10-CM | POA: Insufficient documentation

## 2022-03-26 HISTORY — PX: ATRIAL FIBRILLATION ABLATION: EP1191

## 2022-03-26 SURGERY — ATRIAL FIBRILLATION ABLATION
Anesthesia: General

## 2022-03-26 MED ORDER — SODIUM CHLORIDE 0.9 % IV SOLN: 250.0000 mL | INTRAVENOUS | Status: DC | PRN

## 2022-03-26 MED ORDER — COLCHICINE 0.6 MG PO TABS
0.6000 mg | ORAL_TABLET | Freq: Two times a day (BID) | ORAL | Status: DC
  Administered 2022-03-26: 0.6 mg via ORAL
  Filled 2022-03-26: qty 1

## 2022-03-26 MED ORDER — DEXAMETHASONE SODIUM PHOSPHATE 10 MG/ML IJ SOLN
INTRAMUSCULAR | Status: DC | PRN
  Administered 2022-03-26: 5 mg via INTRAVENOUS

## 2022-03-26 MED ORDER — HEPARIN SODIUM (PORCINE) 1000 UNIT/ML IJ SOLN
INTRAMUSCULAR | Status: AC
  Filled 2022-03-26: qty 10

## 2022-03-26 MED ORDER — PANTOPRAZOLE SODIUM 40 MG PO TBEC
40.0000 mg | DELAYED_RELEASE_TABLET | Freq: Every day | ORAL | 0 refills | Status: DC
  Filled 2022-03-26: qty 45, 45d supply, fill #0

## 2022-03-26 MED ORDER — HEPARIN (PORCINE) IN NACL 2000-0.9 UNIT/L-% IV SOLN
INTRAVENOUS | Status: DC | PRN
  Administered 2022-03-26: 1000 mL

## 2022-03-26 MED ORDER — HEPARIN SODIUM (PORCINE) 1000 UNIT/ML IJ SOLN
INTRAMUSCULAR | Status: DC | PRN
  Administered 2022-03-26: 4000 [IU] via INTRAVENOUS
  Administered 2022-03-26: 3000 [IU] via INTRAVENOUS
  Administered 2022-03-26: 12000 [IU] via INTRAVENOUS

## 2022-03-26 MED ORDER — PROPOFOL 10 MG/ML IV BOLUS
INTRAVENOUS | Status: DC | PRN
  Administered 2022-03-26: 160 mg via INTRAVENOUS

## 2022-03-26 MED ORDER — FENTANYL CITRATE (PF) 100 MCG/2ML IJ SOLN
INTRAMUSCULAR | Status: AC
  Filled 2022-03-26: qty 2

## 2022-03-26 MED ORDER — ACETAMINOPHEN 500 MG PO TABS
ORAL_TABLET | ORAL | Status: AC
  Filled 2022-03-26: qty 2

## 2022-03-26 MED ORDER — COLCHICINE 0.6 MG PO TABS
0.6000 mg | ORAL_TABLET | Freq: Two times a day (BID) | ORAL | 0 refills | Status: DC
  Filled 2022-03-26: qty 10, 5d supply, fill #0

## 2022-03-26 MED ORDER — ACETAMINOPHEN 325 MG PO TABS: 650.0000 mg | ORAL_TABLET | ORAL | Status: DC | PRN

## 2022-03-26 MED ORDER — PANTOPRAZOLE SODIUM 40 MG PO TBEC
40.0000 mg | DELAYED_RELEASE_TABLET | Freq: Every day | ORAL | Status: DC
  Administered 2022-03-26: 40 mg via ORAL
  Filled 2022-03-26: qty 1

## 2022-03-26 MED ORDER — HEPARIN (PORCINE) IN NACL 1000-0.9 UT/500ML-% IV SOLN
INTRAVENOUS | Status: DC | PRN
  Administered 2022-03-26 (×2): 500 mL

## 2022-03-26 MED ORDER — SODIUM CHLORIDE 0.9% FLUSH: 3.0000 mL | Freq: Two times a day (BID) | INTRAVENOUS | Status: DC

## 2022-03-26 MED ORDER — PHENYLEPHRINE HCL-NACL 20-0.9 MG/250ML-% IV SOLN
INTRAVENOUS | Status: DC | PRN
  Administered 2022-03-26: 20 ug/min via INTRAVENOUS

## 2022-03-26 MED ORDER — SODIUM CHLORIDE 0.9% FLUSH: 3.0000 mL | INTRAVENOUS | Status: DC | PRN

## 2022-03-26 MED ORDER — ROCURONIUM BROMIDE 10 MG/ML (PF) SYRINGE
PREFILLED_SYRINGE | INTRAVENOUS | Status: DC | PRN
  Administered 2022-03-26: 70 mg via INTRAVENOUS

## 2022-03-26 MED ORDER — PHENYLEPHRINE 80 MCG/ML (10ML) SYRINGE FOR IV PUSH (FOR BLOOD PRESSURE SUPPORT)
PREFILLED_SYRINGE | INTRAVENOUS | Status: DC | PRN
  Administered 2022-03-26: 80 ug via INTRAVENOUS

## 2022-03-26 MED ORDER — ACETAMINOPHEN 500 MG PO TABS
1000.0000 mg | ORAL_TABLET | Freq: Once | ORAL | Status: AC
  Administered 2022-03-26: 1000 mg via ORAL

## 2022-03-26 MED ORDER — ONDANSETRON HCL 4 MG/2ML IJ SOLN: 4.0000 mg | Freq: Four times a day (QID) | INTRAMUSCULAR | Status: DC | PRN

## 2022-03-26 MED ORDER — LIDOCAINE 2% (20 MG/ML) 5 ML SYRINGE
INTRAMUSCULAR | Status: DC | PRN
  Administered 2022-03-26: 100 mg via INTRAVENOUS

## 2022-03-26 MED ORDER — PROTAMINE SULFATE 10 MG/ML IV SOLN
INTRAVENOUS | Status: DC | PRN
  Administered 2022-03-26: 30 mg via INTRAVENOUS

## 2022-03-26 MED ORDER — ONDANSETRON HCL 4 MG/2ML IJ SOLN
INTRAMUSCULAR | Status: DC | PRN
  Administered 2022-03-26: 4 mg via INTRAVENOUS

## 2022-03-26 MED ORDER — APIXABAN 5 MG PO TABS
5.0000 mg | ORAL_TABLET | Freq: Two times a day (BID) | ORAL | Status: DC
  Administered 2022-03-26: 5 mg via ORAL
  Filled 2022-03-26: qty 1

## 2022-03-26 MED ORDER — SUGAMMADEX SODIUM 200 MG/2ML IV SOLN
INTRAVENOUS | Status: DC | PRN
  Administered 2022-03-26: 200 mg via INTRAVENOUS

## 2022-03-26 MED ORDER — SODIUM CHLORIDE 0.9 % IV SOLN: INTRAVENOUS | Status: DC

## 2022-03-26 SURGICAL SUPPLY — 19 items
BAG SNAP BAND KOVER 36X36 (MISCELLANEOUS) IMPLANT
BLANKET WARM UNDERBOD FULL ACC (MISCELLANEOUS) ×1 IMPLANT
CATH 8FR REPROCESSED SOUNDSTAR (CATHETERS) ×2 IMPLANT
CATH 8FR SOUNDSTAR REPROCESSED (CATHETERS) IMPLANT
CATH ABLAT QDOT MICRO BI TC FJ (CATHETERS) IMPLANT
CATH OCTARAY 2.0 F 3-3-3-3-3 (CATHETERS) IMPLANT
CATH S-M CIRCA TEMP PROBE (CATHETERS) IMPLANT
CATH WEBSTER BI DIR CS D-F CRV (CATHETERS) IMPLANT
CLOSURE PERCLOSE PROSTYLE (VASCULAR PRODUCTS) IMPLANT
PACK EP LATEX FREE (CUSTOM PROCEDURE TRAY) ×1
PACK EP LF (CUSTOM PROCEDURE TRAY) ×1 IMPLANT
PAD DEFIB RADIO PHYSIO CONN (PAD) ×1 IMPLANT
PATCH CARTO3 (PAD) IMPLANT
SHEATH BAYLIS TRANSSEPTAL 98CM (NEEDLE) IMPLANT
SHEATH CARTO VIZIGO SM CVD (SHEATH) IMPLANT
SHEATH PINNACLE 8F 10CM (SHEATH) IMPLANT
SHEATH PINNACLE 9F 10CM (SHEATH) IMPLANT
SHEATH PROBE COVER 6X72 (BAG) IMPLANT
TUBING SMART ABLATE COOLFLOW (TUBING) IMPLANT

## 2022-03-26 NOTE — H&P (Signed)
Electrophysiology Office Note:     Date:  03/26/2022    ID:  Nathan Watts, [place] [birth year], MRN [id number]   PCP:  Einar Pheasant, MD  [facility] Cardiologist:  None  [facility] Electrophysiologist:  Vickie Epley, MD    Referring MD: Kate Sable, MD    Chief Complaint: Atrial arrhythmias   History of Present Illness:     Nathan Watts is a 76 y.o. male who presents for an evaluation of atrial arrhythmias at the request of Dr. Garen Lah. Their medical history includes GERD, hyperlipidemia, hypertension and atrial fibrillation/flutter.   The patient saw his primary care physician on December 11, 2021 and reported that his stomach felt jittery and jumping around for no reason.  The patient is very active and walks about 5 miles per day.  A ZIO monitor was ordered and he was referred to cardiology.  Prior to the patient making his appoint with cardiology his ZIO monitor returned positive for atrial fibrillation and flutter and he was referred to electrophysiology.   The symptoms have been present for at least the last month.  They occur predominantly at night.  He has tolerated the Eliquis without any bleeding issues.  No family history of atrial fibrillation that he is aware of.  No stroke history in his family that he is aware of.    Today he presents for AF/Afl ablation.      Objective      Past Medical History:  Diagnosis Date   Anemia, unspecified     BPH (benign prostatic hyperplasia)     Fatty infiltration of liver     GERD (gastroesophageal reflux disease)     Hypercholesterolemia     Hyperglycemia     Hypertension     Thrombocytopenia (HCC)     Thyroid nodule             Past Surgical History:  Procedure Laterality Date   COLONOSCOPY WITH PROPOFOL N/A 10/02/2014    Procedure: COLONOSCOPY WITH PROPOFOL;  Surgeon: Manya Silvas, MD;  Location: [place];  Service: Endoscopy;  Laterality: N/A;   COLONOSCOPY WITH PROPOFOL N/A  06/15/2021    Procedure: COLONOSCOPY WITH PROPOFOL;  Surgeon: Lesly Rubenstein, MD;  Location: [facility];  Service: Endoscopy;  Laterality: N/A;   ESOPHAGOGASTRODUODENOSCOPY (EGD) WITH PROPOFOL N/A 06/15/2021    Procedure: ESOPHAGOGASTRODUODENOSCOPY (EGD) WITH PROPOFOL;  Surgeon: Lesly Rubenstein, MD;  Location: [facility];  Service: Endoscopy;  Laterality: N/A;   HOLEP-LASER ENUCLEATION OF THE PROSTATE WITH MORCELLATION N/A 04/24/2021    Procedure: HOLEP-LASER ENUCLEATION OF THE PROSTATE WITH MORCELLATION;  Surgeon: Billey Co, MD;  Location: [facility];  Service: Urology;  Laterality: N/A;   INGUINAL HERNIA REPAIR       TONSILLECTOMY       TRANSURETHRAL RESECTION OF BLADDER TUMOR WITH MITOMYCIN-C N/A 04/24/2021    Procedure: 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   atorvastatin (LIPITOR) 20 MG tablet TAKE ONE TABLET BY MOUTH DAILY   Fiber POWD Take 15 mLs by mouth daily.   Lactobacillus (PROBIOTIC ACIDOPHILUS PO) Take by mouth daily.   metroNIDAZOLE (METROGEL) 0.75 % gel Apply 1 application topically 2 (  two) times a week. to face   Multiple Vitamin (MULTI VITAMIN MENS PO) Take 1 tablet by mouth daily.   Omega-3 Fatty Acids (FISH OIL) 1200 MG CAPS Take 1 capsule by mouth daily.   Omeprazole Magnesium (PRILOSEC OTC PO) Take by mouth daily.   pimecrolimus (ELIDEL) 1 % cream 1 application 3 (three) times a week.   vitamin B-12 (CYANOCOBALAMIN) 500 MCG tablet Take 500 mcg by mouth daily.        Allergies:   Crestor [rosuvastatin] and Hydrocodone-chlorpheniramine    Social History         Socioeconomic History   Marital status: Married      Spouse name: Not on file   Number of children: 4    Years of education: Not on file   Highest education level: Not on file  Occupational History   Occupation: retired  Tobacco Use   Smoking status: Former      Packs/day: 1.00      Years: 30.00      Total pack years: 30.00      Types: Cigarettes      Quit date: 04/12/1998      Years since quitting: 23.7   Smokeless tobacco: Never  Vaping Use   Vaping Use: Never used  Substance and Sexual Activity   Alcohol use: Not Currently      Alcohol/week: 1.0 standard drink of alcohol      Types: 1 Cans of beer per week      Comment: OCC   Drug use: Never   Sexual activity: Never  Other Topics Concern   Not on file  Social History Narrative    He is married. Has four children    Social Determinants of Health        Financial Resource Strain: Low Risk  (02/04/2020)    Overall Financial Resource Strain (CARDIA)     Difficulty of Paying Living Expenses: Not hard at all  Food Insecurity: No Food Insecurity (09/18/2021)    Hunger Vital Sign     Worried About Running Out of Food in the Last Year: Never true     Ran Out of Food in the Last Year: Never true  Transportation Needs: No Transportation Needs (09/18/2021)    PRAPARE - Armed forces logistics/support/administrative officer (Medical): No     Lack of Transportation (Non-Medical): No  Physical Activity: Sufficiently Active (09/18/2021)    Exercise Vital Sign     Days of Exercise per Week: 7 days     Minutes of Exercise per Session: 120 min  Stress: No Stress Concern Present (09/18/2021)    [place]     Feeling of Stress : Not at all  Social Connections: Not on file      Family History: The patient's family history includes Heart disease in his father; Prostate cancer in his father; Stroke in his mother. There is no history of Colon cancer.   ROS:   Please see the history of present illness.    All other systems reviewed and are negative.   EKGs/Labs/Other Studies Reviewed:      The following studies were reviewed today:   January 01, 2022 ZIO monitor personally reviewed Patient had a min HR of 35 bpm, max HR of 163 bpm, and avg HR of 56 bpm. Predominant underlying rhythm was Sinus Rhythm. Atrial Fibrillation/Flutter occurred (5% burden), ranging from 53-163 bpm (avg of 95 bpm), the longest lasting 12 hours  37 mins with  an avg rate of 94 bpm. Isolated SVEs were frequent (11.0%, [id number]), SVE Couplets were frequent (13.7%, [id number]), and SVE Triplets were occasional (2.1%, 7152). Isolated VEs were rare (<1.0%), VE Couplets were rare (<1.0%), and no VE Triplets were present.  MD notification criteria for Symptomatic Bradycardia met - notified Valinda Hoar on 31 Dec 2021 at 1:16 PM CDT Lavella Hammock).   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 Recommend starting eliquis for stroke prophylaxis and refer to EP     January 11, 2022 echo EF 55% RV normal Moderately dilated left atrium Mild MR   December 11, 2021 EKG reviewed shows sinus bradycardia with a first-degree AV delay and frequent PACs.       Recent Labs: 09/01/2021: ALT 14 12/11/2021: BUN 14; Creatinine, Ser 0.97; Hemoglobin 13.2; Platelets 151.0; Potassium 4.4; Sodium 140; TSH 1.09  Recent Lipid Panel Labs (Brief)          Component Value Date/Time    CHOL 133 09/01/2021 0912    TRIG 64.0 09/01/2021 0912    HDL 44.90 09/01/2021 0912    CHOLHDL 3 09/01/2021 0912    VLDL 12.8 09/01/2021 0912    LDLCALC 75 09/01/2021 0912        Physical Exam:     VS:  BP 146/72   Pulse 50   Ht _0  (1.803 m)   Wt 174 lb (78.9 kg)   SpO2 95%   BMI 24.27 kg/m         Wt Readings from Last 3 Encounters:  01/13/22 174 lb (78.9 kg)  12/11/21 172 lb 6.4 oz (78.2 kg)  12/03/21 170 lb (77.1 kg)      GEN:  Well nourished, well developed in no acute distress HEENT: Normal NECK: No JVD; No carotid bruits LYMPHATICS: No lymphadenopathy CARDIAC: RRR, no murmurs, rubs, gallops RESPIRATORY:   Clear to auscultation without rales, wheezing or rhonchi  ABDOMEN: Soft, non-tender, non-distended MUSCULOSKELETAL:  No edema; No deformity  SKIN: Warm and dry NEUROLOGIC:  Alert and oriented x 3 PSYCHIATRIC:  Normal affect          Assessment ASSESSMENT:     1. Paroxysmal atrial flutter (HCC)   2. Paroxysmal atrial fibrillation ([place])   3. Aortic atherosclerosis ([place])   4. Primary hypertension     PLAN:     In order of problems listed above:     #Atrial fibrillation and flutter Symptomatic.  Recent onset.  We discussed treatment options for his atrial fibrillation today including a continued/watchful waiting approach, antiarrhythmic drugs and catheter ablation.  He is very active man with a fairly bradycardic rhythm at baseline.  After our discussion the patient would like to proceed with catheter ablation which I think is very reasonable to avoid any progression of his symptoms.   Discussed treatment options today for his AF including antiarrhythmic drug therapy and ablation. Discussed risks, recovery and likelihood of success. Discussed potential need for repeat ablation procedures and antiarrhythmic drugs after an initial ablation. They wish to proceed with scheduling.   Risk, benefits, and alternatives to EP study and radiofrequency ablation for afib were also discussed in detail today. These risks include but are not limited to stroke, bleeding, vascular damage, tamponade, perforation, damage to the esophagus, lungs, and other structures, pulmonary vein stenosis, worsening renal function, and death. The patient understands these risk and wishes to proceed.  We will therefore proceed with catheter ablation at the next available time.  Carto, ICE, anesthesia are  requested for the procedure.  Will also obtain CT PV protocol prior to the procedure to exclude LAA thrombus and further evaluate atrial anatomy.     CHA2DS2-VASc Score = 4  The patient's score is based upon: CHF History:  0 HTN History: 1 Diabetes History: 0 Stroke History: 0 Vascular Disease History: 1 Age Score: 2 Gender Score: 0    Today Mr Kohan presents for AF/AFL ablation. Procedure reviewed.     Signed, Hilton Cork. Quentin Ore, MD, [facility], [facility] 03/26/2022 Electrophysiology [facility]

## 2022-03-26 NOTE — Transfer of Care (Signed)
Immediate Anesthesia Transfer of Care Note  Patient: Nathan Watts  Procedure(s) Performed: ATRIAL FIBRILLATION ABLATION  Patient Location: Cath Lab  Anesthesia Type:General  Level of Consciousness: awake, alert , and oriented  Airway & Oxygen Therapy: Patient Spontanous Breathing and Patient connected to nasal cannula oxygen  Post-op Assessment: Report given to RN and Post -op Vital signs reviewed and stable  Post vital signs: Reviewed and stable  Last Vitals:  Vitals Value Taken Time  BP 114/67 03/26/22 1211  Temp 36.4 C 03/26/22 1212  Pulse 60 03/26/22 1212  Resp 14 03/26/22 1212  SpO2 99 % 03/26/22 1212  Vitals shown include unvalidated device data.  Last Pain:  Vitals:   03/26/22 1212  TempSrc: Temporal  PainSc: 0-No pain         Complications: No notable events documented.

## 2022-03-26 NOTE — Anesthesia Preprocedure Evaluation (Addendum)
Anesthesia Evaluation  Patient identified by MRN, date of birth, ID band Patient awake    Reviewed: Allergy & Precautions, NPO status , Patient's Chart, lab work & pertinent test results  History of Anesthesia Complications Negative for: history of anesthetic complications  Airway Mallampati: III  TM Distance: >3 FB Neck ROM: Full    Dental no notable dental hx. (+) Dental Advisory Given   Pulmonary former smoker   Pulmonary exam normal        Cardiovascular hypertension, + dysrhythmias Atrial Fibrillation  Rhythm:Irregular Rate:Normal  IMPRESSIONS     1. Left ventricular ejection fraction, by estimation, is 55 to 60%. The  left ventricle has normal function. The left ventricle has no regional  wall motion abnormalities. Left ventricular diastolic parameters were  normal. The average left ventricular  global longitudinal strain is -19.2 %. The global longitudinal strain is  normal.   2. Right ventricular systolic function is normal. The right ventricular  size is normal. There is normal pulmonary artery systolic pressure.   3. Left atrial size was moderately dilated.   4. The mitral valve is normal in structure. Mild mitral valve  regurgitation. No evidence of mitral stenosis.   5. The aortic valve is normal in structure. Aortic valve regurgitation is  not visualized. No aortic stenosis is present.   6. The inferior vena cava is dilated in size with >50% respiratory  variability, suggesting right atrial pressure of 8 mmHg.      Neuro/Psych negative neurological ROS     GI/Hepatic Neg liver ROS,GERD  ,,  Endo/Other  negative endocrine ROS    Renal/GU negative Renal ROS     Musculoskeletal negative musculoskeletal ROS (+)    Abdominal   Peds  Hematology negative hematology ROS (+)   Anesthesia Other Findings   Reproductive/Obstetrics                             Anesthesia  Physical Anesthesia Plan  ASA: 3  Anesthesia Plan: General   Post-op Pain Management: Tylenol PO (pre-op)*   Induction: Intravenous  PONV Risk Score and Plan: 2 and Ondansetron and Dexamethasone  Airway Management Planned: Oral ETT  Additional Equipment:   Intra-op Plan:   Post-operative Plan: Extubation in OR  Informed Consent: I have reviewed the patients History and Physical, chart, labs and discussed the procedure including the risks, benefits and alternatives for the proposed anesthesia with the patient or authorized representative who has indicated his/her understanding and acceptance.     Dental advisory given  Plan Discussed with: Anesthesiologist and CRNA  Anesthesia Plan Comments:         Anesthesia Quick Evaluation

## 2022-03-26 NOTE — Anesthesia Postprocedure Evaluation (Signed)
Anesthesia Post Note  Patient: Nathan Watts  Procedure(s) Performed: ATRIAL FIBRILLATION ABLATION     Patient location during evaluation: PACU Anesthesia Type: General Level of consciousness: sedated Pain management: pain level controlled Vital Signs Assessment: post-procedure vital signs reviewed and stable Respiratory status: spontaneous breathing and respiratory function stable Cardiovascular status: stable Postop Assessment: no apparent nausea or vomiting Anesthetic complications: no   No notable events documented.  Last Vitals:  Vitals:   03/26/22 1600 03/26/22 1639  BP: 119/66 128/73  Pulse: 69 63  Resp: 14 15  Temp:    SpO2: 96% 96%    Last Pain:  Vitals:   03/26/22 1344  TempSrc:   PainSc: 0-No pain                 Aolani Piggott DANIEL

## 2022-03-26 NOTE — Discharge Instructions (Signed)

## 2022-03-26 NOTE — Anesthesia Procedure Notes (Signed)
Procedure Name: Intubation Date/Time: 03/26/2022 9:56 AM  Performed by: Erick Colace, CRNAPre-anesthesia Checklist: Patient identified, Emergency Drugs available, Suction available and Patient being monitored Patient Re-evaluated:Patient Re-evaluated prior to induction Oxygen Delivery Method: Circle system utilized Preoxygenation: Pre-oxygenation with 100% oxygen Induction Type: IV induction Ventilation: Mask ventilation without difficulty and Oral airway inserted - appropriate to patient size Laryngoscope Size: Mac and 4 Grade View: Grade I Tube type: Oral Tube size: 7.5 mm Number of attempts: 1 Airway Equipment and Method: Stylet and Oral airway Placement Confirmation: ETT inserted through vocal cords under direct vision, positive ETCO2 and breath sounds checked- equal and bilateral Secured at: 23 cm Tube secured with: Tape Dental Injury: Teeth and Oropharynx as per pre-operative assessment

## 2022-03-29 ENCOUNTER — Encounter (HOSPITAL_COMMUNITY): Payer: Self-pay | Admitting: Cardiology

## 2022-03-29 ENCOUNTER — Encounter: Payer: Self-pay | Admitting: Internal Medicine

## 2022-03-30 ENCOUNTER — Telehealth (INDEPENDENT_AMBULATORY_CARE_PROVIDER_SITE_OTHER): Payer: Medicare Other | Admitting: Family Medicine

## 2022-03-30 VITALS — Temp 97.5°F | Ht 71.0 in | Wt 165.0 lb

## 2022-03-30 DIAGNOSIS — R051 Acute cough: Secondary | ICD-10-CM | POA: Diagnosis not present

## 2022-03-30 DIAGNOSIS — R0981 Nasal congestion: Secondary | ICD-10-CM

## 2022-03-30 LAB — POCT ACTIVATED CLOTTING TIME
Activated Clotting Time: 282 seconds
Activated Clotting Time: 304 seconds

## 2022-03-30 MED ORDER — BENZONATATE 100 MG PO CAPS: ORAL_CAPSULE | ORAL | 0 refills | Status: DC

## 2022-03-30 NOTE — Patient Instructions (Signed)
  HOME CARE TIPS:  -COVID19 testing information: [email or web address].[email or web address]  Most pharmacies also offer testing and home test kits. If the Covid19 test is positive and you desire antiviral treatment, please contact a [place] or schedule a follow up virtual visit through your primary care office or through the [employer].  Other test to treat options: ConnectRV.is?[email or web address]  -I sent the medication(s) we discussed to your pharmacy: Meds ordered this encounter  Medications   benzonatate (TESSALON PERLES) 100 MG capsule    Sig: 1-2 capsules up to twice daily as needed for cough.    Dispense:  30 capsule    Refill:  0     -can use tylenol  if needed for fevers, aches and pains per instructions  -nasal saline sinus rinses twice daily  -stay hydrated, drink plenty of fluids and eat small healthy meals - avoid dairy  -follow up with your doctor in 2-3 days unless improving and feeling better  -stay home while sick, except to seek medical care. If you have COVID19, you will likely be contagious for 7-10 days. Flu or Influenza is likely contagious for about 7 days. Other respiratory viral infections remain contagious for 5-10+ days depending on the virus and many other factors. Wear a good mask that fits snugly (such as N95 or KN95) if around others to reduce the risk of transmission.  It was nice to meet you today, and I really hope you are feeling better soon. I help [facility] out with telemedicine visits on Tuesdays and Thursdays and am happy to help if you need a follow up virtual visit on those days. Otherwise, if you have any concerns or questions following this visit please schedule a follow up visit with your Primary Care doctor or seek care at a local urgent care clinic to avoid delays in care.    Seek in person care or schedule a follow up video visit promptly if your symptoms worsen, new concerns arise or you are not  improving with treatment. Call 911 and/or seek emergency care if your symptoms are severe or life threatening.

## 2022-03-30 NOTE — Progress Notes (Signed)
Virtual Visit via Video Note  I connected with Nathan Watts   on 03/30/22 at 12:20 PM EST by a video enabled telemedicine application and verified that I am speaking with the correct person using two identifiers.  Location patient: [place] Location provider:work or home office Persons participating in the virtual visit: patient, provider  I discussed the limitations and requested verbal permission for telemedicine visit. The patient expressed understanding and agreed to proceed.   HPI:  Acute telemedicine visit for  congestion and cough: -Onset: 2 days ago -Symptoms include: started with sore throat, now with runny nose, nasal congestion, cough, hoarseness -Denies: fevers, CP, SOB, body aches, NVD -Has tried: musinex -Pertinent past medical history: see below -Pertinent medication allergies: Allergies  Allergen Reactions   Crestor [Rosuvastatin] Other (See Comments)    Causes GI upset and anxiety   Hydrocodone-Chlorpheniramine Other (See Comments)    Unknown, possible causes anxiety  possible causes anxiety  Did ok with low dose  -COVID-19 vaccine status:  Immunization History  Administered Date(s) Administered   Fluad Quad(high Dose 65+) 12/12/2018, 01/04/2020, 03/02/2021, 01/06/2022   Hepb-cpg 04/17/2021   Influenza Split 01/28/2012   Influenza, High Dose Seasonal PF 01/24/2016, 02/04/2017, 01/16/2018   Influenza,inj,Quad PF,6+ Mos 12/28/2012, 01/05/2014, 01/06/2015   PFIZER(Purple Top)SARS-COV-2 Vaccination 04/18/2019, 05/09/2019, 12/04/2019, 07/22/2020   Pfizer Covid-19 Vaccine Bivalent Booster 79yr & up 01/08/2021   Pneumococcal Conjugate-13 01/23/2014   Pneumococcal Polysaccharide-23 03/14/2012   Tdap 12/07/2016   Zoster Recombinat (Shingrix) 11/02/2016, 03/09/2017     ROS: See pertinent positives and negatives per HPI.  Past Medical History:  Diagnosis Date   Anemia, unspecified    BPH (benign prostatic hyperplasia)    Fatty infiltration of liver    GERD  (gastroesophageal reflux disease)    Hypercholesterolemia    Hyperglycemia    Hypertension    Thrombocytopenia (HCC)    Thyroid nodule     Past Surgical History:  Procedure Laterality Date   ATRIAL FIBRILLATION ABLATION N/A 03/26/2022   Procedure: ATRIAL FIBRILLATION ABLATION;  Surgeon: LVickie Epley MD;  [facility];  Service: Cardiovascular;  Laterality: N/A;   COLONOSCOPY WITH PROPOFOL N/A 10/02/2014   Procedure: COLONOSCOPY WITH PROPOFOL;  Surgeon: RManya Silvas MD;  [facility];  Service: Endoscopy;  Laterality: N/A;   COLONOSCOPY WITH PROPOFOL N/A 06/15/2021   Procedure: COLONOSCOPY WITH PROPOFOL;  Surgeon: LLesly Rubenstein MD;  Location: [facility];  Service: Endoscopy;  Laterality: N/A;   ESOPHAGOGASTRODUODENOSCOPY (EGD) WITH PROPOFOL N/A 06/15/2021   Procedure: ESOPHAGOGASTRODUODENOSCOPY (EGD) WITH PROPOFOL;  Surgeon: LLesly Rubenstein MD;  Location: [facility];  Service: Endoscopy;  Laterality: N/A;   HOLEP-LASER ENUCLEATION OF THE PROSTATE WITH MORCELLATION N/A 04/24/2021   Procedure: HOLEP-LASER ENUCLEATION OF THE PROSTATE WITH MORCELLATION;  Surgeon: SBilley Co MD;  Location: [facility];  Service: Urology;  Laterality: N/A;   INGUINAL HERNIA REPAIR     TONSILLECTOMY     TRANSURETHRAL RESECTION OF BLADDER TUMOR WITH MITOMYCIN-C N/A 04/24/2021   Procedure: 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;  Surgeon: SBilley Co MD;  Location: [facility];  Service: Urology;  Laterality: N/A;     Current Outpatient Medications:    apixaban (ELIQUIS) 5 MG TABS tablet, Take 1 tablet (5 mg total) by mouth 2 (two) times daily., Disp: 60 tablet, Rfl: 5   atorvastatin (LIPITOR) 20 MG tablet, TAKE ONE TABLET BY MOUTH DAILY (Patient taking differently: Take 20 mg by mouth at bedtime.), Disp: 90 tablet, Rfl: 2  benzonatate (TESSALON PERLES) 100 MG capsule, 1-2 capsules up to twice daily as needed for cough., Disp: 30  capsule, Rfl: 0   Carboxymethylcellul-Glycerin (REFRESH RELIEVA PF OP), Place 1 drop into both eyes 4 (four) times daily., Disp: , Rfl:    colchicine 0.6 MG tablet, Take 1 tablet (0.6 mg total) by mouth 2 (two) times daily for 5 days., Disp: 10 tablet, Rfl: 0   cyanocobalamin 1000 MCG tablet, Take 1,000 mcg by mouth in the morning., Disp: , Rfl:    metroNIDAZOLE (METROGEL) 0.75 % gel, Apply 1 application  topically at bedtime. to face, Disp: , Rfl:    Multiple Vitamin (MULTIVITAMIN WITH MINERALS) TABS tablet, Take 1 tablet by mouth in the morning., Disp: , Rfl:    Omega-3 Fatty Acids (FISH OIL) 1200 MG CAPS, Take 1,200 mg by mouth in the morning., Disp: , Rfl:    pantoprazole (PROTONIX) 40 MG tablet, Take 1 tablet (40 mg total) by mouth daily., Disp: 45 tablet, Rfl: 0   pimecrolimus (ELIDEL) 1 % cream, Apply 1 application  topically daily as needed (rosacea)., Disp: , Rfl:    Probiotic Product (PROBIOTIC PO), Take 1 capsule by mouth in the morning., Disp: , Rfl:    Psyllium (METAMUCIL PO), Take 6 g by mouth in the morning. Metamucil Fiber, Disp: , Rfl:   EXAM:  VITALS per patient if applicable:  GENERAL: alert, oriented, appears well and in no acute distress  HEENT: atraumatic, conjunttiva clear, no obvious abnormalities on inspection of external nose and ears  NECK: normal movements of the head and neck  LUNGS: on inspection no signs of respiratory distress, breathing rate appears normal, no obvious gross SOB, gasping or wheezing  CV: no obvious cyanosis  MS: moves all visible extremities without noticeable abnormality  PSYCH/NEURO: pleasant and cooperative, no obvious depression or anxiety, speech and thought processing grossly intact  ASSESSMENT AND PLAN:  Discussed the following assessment and plan:  Nasal congestion  Acute cough  -we discussed possible serious and likely etiologies, options for evaluation and workup, limitations of telemedicine visit vs in person visit,  treatment, treatment risks and precautions. Pt is agreeable to treatment via telemedicine at this moment. Query VURI, possible covid vs other. He has home covid tests and is going to do one today and call my office if positive for legevrio after discussion options if positive. Also sent tessalon for cough. Nasal saline.  Advised to seek prompt virtual visit or in person care if worsening, new symptoms arise, or if is not improving with treatment as expected per our conversation of expected course. Discussed options for follow up care. Did let this patient know that I do telemedicine on Tuesdays and Thursdays for [place] and those are the days I am logged into the system. Advised to schedule follow up visit with PCP, [place] virtual visits or [facility] if any further questions or concerns to avoid delays in care.   I discussed the assessment and treatment plan with the patient. The patient was provided an opportunity to ask questions and all were answered. The patient agreed with the plan and demonstrated an understanding of the instructions.     Lucretia Kern, DO

## 2022-04-07 ENCOUNTER — Encounter: Payer: Self-pay | Admitting: Ophthalmology

## 2022-04-07 DIAGNOSIS — H2511 Age-related nuclear cataract, right eye: Secondary | ICD-10-CM | POA: Diagnosis not present

## 2022-04-15 ENCOUNTER — Telehealth: Payer: Self-pay

## 2022-04-15 DIAGNOSIS — N453 Epididymo-orchitis: Secondary | ICD-10-CM

## 2022-04-15 MED ORDER — SULFAMETHOXAZOLE-TRIMETHOPRIM 800-160 MG PO TABS: 1.0000 | ORAL_TABLET | Freq: Two times a day (BID) | ORAL | 0 refills | Status: AC

## 2022-04-15 NOTE — Telephone Encounter (Signed)
See my chart message

## 2022-04-19 NOTE — Discharge Instructions (Signed)

## 2022-04-20 ENCOUNTER — Other Ambulatory Visit: Payer: Self-pay

## 2022-04-20 ENCOUNTER — Ambulatory Visit: Payer: Medicare Other | Admitting: General Practice

## 2022-04-20 ENCOUNTER — Ambulatory Visit
Admission: RE | Admit: 2022-04-20 | Discharge: 2022-04-20 | Disposition: A | Discharge status: Home / Self Care | Payer: Medicare Other | Attending: Ophthalmology | Admitting: Ophthalmology

## 2022-04-20 ENCOUNTER — Encounter
Admission: RE | Disposition: A | Discharge status: Home / Self Care | Payer: Self-pay | Source: Home / Self Care | Attending: Ophthalmology

## 2022-04-20 ENCOUNTER — Encounter: Payer: Self-pay | Admitting: Ophthalmology

## 2022-04-20 DIAGNOSIS — H2512 Age-related nuclear cataract, left eye: Secondary | ICD-10-CM | POA: Insufficient documentation

## 2022-04-20 DIAGNOSIS — K219 Gastro-esophageal reflux disease without esophagitis: Secondary | ICD-10-CM | POA: Insufficient documentation

## 2022-04-20 DIAGNOSIS — Z8249 Family history of ischemic heart disease and other diseases of the circulatory system: Secondary | ICD-10-CM | POA: Insufficient documentation

## 2022-04-20 DIAGNOSIS — I10 Essential (primary) hypertension: Secondary | ICD-10-CM | POA: Diagnosis not present

## 2022-04-20 DIAGNOSIS — I2510 Atherosclerotic heart disease of native coronary artery without angina pectoris: Secondary | ICD-10-CM | POA: Diagnosis not present

## 2022-04-20 DIAGNOSIS — Z87891 Personal history of nicotine dependence: Secondary | ICD-10-CM | POA: Insufficient documentation

## 2022-04-20 HISTORY — PX: CATARACT EXTRACTION W/PHACO: SHX586

## 2022-04-20 SURGERY — PHACOEMULSIFICATION, CATARACT, WITH IOL INSERTION
Anesthesia: Monitor Anesthesia Care | Site: Eye | Laterality: Left

## 2022-04-20 MED ORDER — SIGHTPATH DOSE#1 BSS IO SOLN
INTRAOCULAR | Status: DC | PRN
  Administered 2022-04-20: 15 mL via INTRAOCULAR

## 2022-04-20 MED ORDER — MIDAZOLAM HCL 2 MG/2ML IJ SOLN
INTRAMUSCULAR | Status: DC | PRN
  Administered 2022-04-20 (×2): 1 mg via INTRAVENOUS

## 2022-04-20 MED ORDER — LACTATED RINGERS IV SOLN: INTRAVENOUS | Status: DC

## 2022-04-20 MED ORDER — FENTANYL CITRATE PF 50 MCG/ML IJ SOSY: 25.0000 ug | PREFILLED_SYRINGE | INTRAMUSCULAR | Status: DC | PRN

## 2022-04-20 MED ORDER — MOXIFLOXACIN HCL 0.5 % OP SOLN
OPHTHALMIC | Status: DC | PRN
  Administered 2022-04-20: .2 mL via OPHTHALMIC

## 2022-04-20 MED ORDER — SIGHTPATH DOSE#1 BSS IO SOLN
INTRAOCULAR | Status: DC | PRN
  Administered 2022-04-20: 67 mL via OPHTHALMIC

## 2022-04-20 MED ORDER — ARMC OPHTHALMIC DILATING DROPS
1.0000 | OPHTHALMIC | Status: DC | PRN
  Administered 2022-04-20 (×3): 1 via OPHTHALMIC

## 2022-04-20 MED ORDER — ONDANSETRON HCL 4 MG/2ML IJ SOLN: 4.0000 mg | Freq: Once | INTRAMUSCULAR | Status: DC | PRN

## 2022-04-20 MED ORDER — SIGHTPATH DOSE#1 BSS IO SOLN
INTRAOCULAR | Status: DC | PRN
  Administered 2022-04-20: 2 mL

## 2022-04-20 MED ORDER — TETRACAINE HCL 0.5 % OP SOLN
1.0000 [drp] | OPHTHALMIC | Status: DC | PRN
  Administered 2022-04-20 (×3): 1 [drp] via OPHTHALMIC

## 2022-04-20 MED ORDER — FENTANYL CITRATE (PF) 100 MCG/2ML IJ SOLN
INTRAMUSCULAR | Status: DC | PRN
  Administered 2022-04-20: 50 ug via INTRAVENOUS

## 2022-04-20 MED ORDER — SIGHTPATH DOSE#1 NA CHONDROIT SULF-NA HYALURON 40-17 MG/ML IO SOLN
INTRAOCULAR | Status: DC | PRN
  Administered 2022-04-20: 1 mL via INTRAOCULAR

## 2022-04-20 MED ORDER — BRIMONIDINE TARTRATE-TIMOLOL 0.2-0.5 % OP SOLN
OPHTHALMIC | Status: DC | PRN
  Administered 2022-04-20: 1 [drp] via OPHTHALMIC

## 2022-04-20 SURGICAL SUPPLY — 11 items
CANNULA ANT/CHMB 27G (MISCELLANEOUS) IMPLANT
CANNULA ANT/CHMB 27GA (MISCELLANEOUS) IMPLANT
CATARACT SUITE SIGHTPATH (MISCELLANEOUS) ×1 IMPLANT
FEE CATARACT SUITE SIGHTPATH (MISCELLANEOUS) ×1 IMPLANT
GLOVE SURG ENC TEXT LTX SZ8 (GLOVE) ×1 IMPLANT
GLOVE SURG TRIUMPH 8.0 PF LTX (GLOVE) ×1 IMPLANT
LENS IOL TECNIS EYHANCE 21.0 (Intraocular Lens) IMPLANT
NDL FILTER BLUNT 18X1 1/2 (NEEDLE) ×1 IMPLANT
NEEDLE FILTER BLUNT 18X1 1/2 (NEEDLE) ×1 IMPLANT
SYR 3ML LL SCALE MARK (SYRINGE) ×1 IMPLANT
WATER STERILE IRR 250ML POUR (IV SOLUTION) ×1 IMPLANT

## 2022-04-20 NOTE — Anesthesia Preprocedure Evaluation (Signed)
Anesthesia Evaluation  Patient identified by MRN, date of birth, ID band Patient awake    Reviewed: Allergy & Precautions, H&P , NPO status , Patient's Chart, lab work & pertinent test results, reviewed documented beta blocker date and time   Airway Mallampati: II  TM Distance: >3 FB Neck ROM: full    Dental no notable dental hx. (+) Teeth Intact   Pulmonary neg pulmonary ROS, former smoker   Pulmonary exam normal breath sounds clear to auscultation       Cardiovascular Exercise Tolerance: Good hypertension, On Medications + CAD   Rhythm:regular Rate:Normal     Neuro/Psych negative neurological ROS  negative psych ROS   GI/Hepatic Neg liver ROS,GERD  Medicated,,  Endo/Other  negative endocrine ROSdiabetes, Well Controlled    Renal/GU      Musculoskeletal   Abdominal   Peds  Hematology negative hematology ROS (+)   Anesthesia Other Findings   Reproductive/Obstetrics negative OB ROS                             Anesthesia Physical Anesthesia Plan  ASA: 3  Anesthesia Plan: MAC   Post-op Pain Management:    Induction:   PONV Risk Score and Plan:   Airway Management Planned:   Additional Equipment:   Intra-op Plan:   Post-operative Plan:   Informed Consent: I have reviewed the patients History and Physical, chart, labs and discussed the procedure including the risks, benefits and alternatives for the proposed anesthesia with the patient or authorized representative who has indicated his/her understanding and acceptance.       Plan Discussed with: CRNA  Anesthesia Plan Comments:        Anesthesia Quick Evaluation

## 2022-04-20 NOTE — Transfer of Care (Signed)
Immediate Anesthesia Transfer of Care Note  Patient: Nathan Watts  Procedure(s) Performed: CATARACT EXTRACTION PHACO AND INTRAOCULAR LENS PLACEMENT (IOC) LEFT 13.73 01:12.4 (Left: Eye)  Patient Location: PACU  Anesthesia Type:MAC  Level of Consciousness: awake, alert , and oriented  Airway & Oxygen Therapy: Patient Spontanous Breathing  Post-op Assessment: Report given to RN  Post vital signs: Reviewed and stable  Last Vitals:  Vitals Value Taken Time  BP 106/69 04/20/22 1403  Temp 36.6 C 04/20/22 1403  Pulse 62 04/20/22 1406  Resp 15 04/20/22 1406  SpO2 97 % 04/20/22 1406  Vitals shown include unvalidated device data.  Last Pain:  Vitals:   04/20/22 1403  PainSc: 0-No pain         Complications: No notable events documented.

## 2022-04-20 NOTE — H&P (Signed)
[place]   Primary Care Physician:  Einar Pheasant, MD Ophthalmologist: Dr. George Ina  Pre-Procedure History & Physical: HPI:  Nathan Watts is a 77 y.o. male here for cataract surgery.   Past Medical History:  Diagnosis Date   BPH (benign prostatic hyperplasia)    Fatty infiltration of liver    GERD (gastroesophageal reflux disease)    Hypercholesterolemia    Hyperglycemia    Hypertension    Thrombocytopenia (HCC)    Thyroid nodule     Past Surgical History:  Procedure Laterality Date   ATRIAL FIBRILLATION ABLATION N/A 03/26/2022   Procedure: ATRIAL FIBRILLATION ABLATION;  Surgeon: Vickie Epley, MD;  Location: [place] [facility];  Service: Cardiovascular;  Laterality: N/A;   COLONOSCOPY WITH PROPOFOL N/A 10/02/2014   Procedure: COLONOSCOPY WITH PROPOFOL;  Surgeon: Manya Silvas, MD;  Location: [facility] ENDOSCOPY;  Service: Endoscopy;  Laterality: N/A;   COLONOSCOPY WITH PROPOFOL N/A 06/15/2021   Procedure: COLONOSCOPY WITH PROPOFOL;  Surgeon: Lesly Rubenstein, MD;  Location: [facility] ENDOSCOPY;  Service: Endoscopy;  Laterality: N/A;   ESOPHAGOGASTRODUODENOSCOPY (EGD) WITH PROPOFOL N/A 06/15/2021   Procedure: ESOPHAGOGASTRODUODENOSCOPY (EGD) WITH PROPOFOL;  Surgeon: Lesly Rubenstein, MD;  Location: [facility] ENDOSCOPY;  Service: Endoscopy;  Laterality: N/A;   HOLEP-LASER ENUCLEATION OF THE PROSTATE WITH MORCELLATION N/A 04/24/2021   Procedure: HOLEP-LASER ENUCLEATION OF THE PROSTATE WITH MORCELLATION;  Surgeon: Billey Co, MD;  Location: [facility] ORS;  Service: Urology;  Laterality: N/A;   INGUINAL HERNIA REPAIR     TONSILLECTOMY     TRANSURETHRAL RESECTION OF BLADDER TUMOR WITH MITOMYCIN-C N/A 04/24/2021   Procedure: 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 Authorizing Provider   apixaban (ELIQUIS) 5 MG TABS tablet Take 1 tablet (5 mg total) by mouth 2 (two) times daily. 01/25/22  Yes Agbor-Etang, Aaron Edelman, MD  atorvastatin (LIPITOR) 20 MG tablet TAKE ONE TABLET BY MOUTH DAILY Patient taking differently: Take 20 mg by mouth at bedtime. 12/28/21  Yes Einar Pheasant, MD  Carboxymethylcellul-Glycerin (REFRESH RELIEVA PF OP) Place 1 drop into both eyes 4 (four) times daily.   Yes [provider]  cyanocobalamin 1000 MCG tablet Take 1,000 mcg by mouth in the morning.   Yes [provider]  metroNIDAZOLE (METROGEL) 0.75 % gel Apply 1 application  topically at bedtime. to face 04/27/19  Yes [provider]  Multiple Vitamin (MULTIVITAMIN WITH MINERALS) TABS tablet Take 1 tablet by mouth in the morning.   Yes [provider]  Omega-3 Fatty Acids (FISH OIL) 1200 MG CAPS Take 1,200 mg by mouth in the morning.   Yes [provider]  pantoprazole (PROTONIX) 40 MG tablet Take 1 tablet (40 mg total) by mouth daily. 03/26/22 05/10/22 Yes TillerySatira Mccallum, PA-C  Probiotic Product (PROBIOTIC PO) Take 1 capsule by mouth in the morning.   Yes [provider]  Psyllium (METAMUCIL PO) Take 6 g by mouth in the morning. Metamucil Fiber   Yes [provider]  sulfamethoxazole-trimethoprim (BACTRIM DS) 800-160 MG tablet Take 1 tablet by mouth 2 (two) times daily for 14 days. 04/15/22 04/29/22 Yes Billey Co, MD  benzonatate (TESSALON PERLES) 100 MG capsule 1-2 capsules up to twice daily as needed for cough. Patient not taking: Reported on 04/07/2022 03/30/22   Lucretia Kern, DO  colchicine 0.6  MG tablet Take 1 tablet (0.6 mg total) by mouth 2 (two) times daily for 5 days. 03/26/22 03/31/22  Shirley Friar, PA-C  pimecrolimus (ELIDEL) 1 % cream Apply 1 application  topically daily as needed (rosacea). 04/26/19   [provider]    Allergies as of 03/15/2022 - Review Complete 03/15/2022  Allergen Reaction Noted    Crestor [rosuvastatin] Other (See Comments) 03/14/2012   Hydrocodone-chlorpheniramine Other (See Comments) 07/19/2014    Family History  Problem Relation Age of Onset   Prostate cancer Father    Heart disease Father        s/p CABG   Stroke Mother    Colon cancer Neg Hx     Social History   Socioeconomic History   Marital status: Married    Spouse name: Not on file   Number of children: 4   Years of education: Not on file   Highest education level: Not on file  Occupational History   Occupation: retired  Tobacco Use   Smoking status: Former    Packs/day: 1.00    Years: 30.00    Total pack years: 30.00    Types: Cigarettes    Quit date: 04/12/1998    Years since quitting: 24.0    Passive exposure: Past   Smokeless tobacco: Never  Vaping Use   Vaping Use: Never used  Substance and Sexual Activity   Alcohol use: Not Currently    Alcohol/week: 1.0 standard drink of alcohol    Types: 1 Cans of beer per week    Comment: OCC   Drug use: Never   Sexual activity: Never  Other Topics Concern   Not on file  Social History Narrative   He is married. Has four children   Social Determinants of Health   Financial Resource Strain: Low Risk  (02/04/2020)   Overall Financial Resource Strain (CARDIA)    Difficulty of Paying Living Expenses: Not hard at all  Food Insecurity: No Food Insecurity (09/18/2021)   Hunger Vital Sign    Worried About Running Out of Food in the Last Year: Never true    Ran Out of Food in the Last Year: Never true  Transportation Needs: No Transportation Needs (09/18/2021)   PRAPARE - Hydrologist (Medical): No    Lack of Transportation (Non-Medical): No  Physical Activity: Sufficiently Active (09/18/2021)   Exercise Vital Sign    Days of Exercise per Week: 7 days    Minutes of Exercise per Session: 120 min  Stress: No Stress Concern Present (09/18/2021)   [place]    Feeling of Stress : Not at all  Social Connections: Not on file  Intimate Partner Violence: Not At Risk (09/18/2021)   Humiliation, Afraid, Rape, and Kick questionnaire    Fear of Current or Ex-Partner: No    Emotionally Abused: No    Physically Abused: No    Sexually Abused: No    Review of Systems: See HPI, otherwise negative ROS  Physical Exam: BP 123/75   Pulse 62   Temp (!) 97.2 F (36.2 C)   Wt 78.5 kg   SpO2 98%   BMI 24.13 kg/m  General:   Alert, cooperative in NAD Head:  Normocephalic and atraumatic. Respiratory:  Normal work of breathing. Cardiovascular:  RRR  Impression/Plan: [name] is here for cataract surgery.  Risks, benefits, limitations, and alternatives regarding cataract surgery have been reviewed with the patient.  Questions have been answered.  All parties agreeable.   Birder Robson, MD  04/20/2022, 1:35 PM

## 2022-04-20 NOTE — Op Note (Signed)
PREOPERATIVE DIAGNOSIS:  Nuclear sclerotic cataract of the left eye.   POSTOPERATIVE DIAGNOSIS:  Nuclear sclerotic cataract of the left eye.   OPERATIVE PROCEDURE:ORPROCALL@   SURGEON:  Birder Robson, MD.   ANESTHESIA:  Anesthesiologist: Molli Barrows, MD  1.      Managed anesthesia care. 2.     0.70m of Shugarcaine was instilled following the paracentesis   COMPLICATIONS:  None.   TECHNIQUE:   Stop and chop   DESCRIPTION OF PROCEDURE:  The patient was examined and consented in the preoperative holding area where the aforementioned topical anesthesia was applied to the left eye and then brought back to the Operating Room where the left eye was prepped and draped in the usual sterile ophthalmic fashion and a lid speculum was placed. A paracentesis was created with the side port blade and the anterior chamber was filled with viscoelastic. A near clear corneal incision was performed with the steel keratome. A continuous curvilinear capsulorrhexis was performed with a cystotome followed by the capsulorrhexis forceps. Hydrodissection and hydrodelineation were carried out with BSS on a blunt cannula. The lens was removed in a stop and chop  technique and the remaining cortical material was removed with the irrigation-aspiration handpiece. The capsular bag was inflated with viscoelastic and the Technis [id number] lens was placed in the capsular bag without complication. The remaining viscoelastic was removed from the eye with the irrigation-aspiration handpiece. The wounds were hydrated. The anterior chamber was flushed with BSS and the eye was inflated to physiologic pressure. 0.145mVigamox was placed in the anterior chamber. The wounds were found to be water tight. The eye was dressed with Combigan. The patient was given protective glasses to wear throughout the day and a shield with which to sleep tonight. The patient was also given drops with which to begin a drop regimen today and will follow-up with  me in one day. Implant Name Type Inv. Item Serial No. Manufacturer Lot No. LRB No. Used Action  LENS IOL TECNIS EYHANCE 21.0 - [id number] Lens LENS IOL TECNIS EYHANCE 21.0 [id number]  Left 1 Implanted    Procedure(s): CATARACT EXTRACTION PHACO AND INTRAOCULAR LENS PLACEMENT (IOC) LEFT 13.73 01:12.4 (Left)  Electronically signed: WiBirder Robson/12/2022 2:01 PM

## 2022-04-21 ENCOUNTER — Encounter: Payer: Self-pay | Admitting: Ophthalmology

## 2022-04-23 ENCOUNTER — Ambulatory Visit (HOSPITAL_COMMUNITY)
Admission: RE | Admit: 2022-04-23 | Discharge: 2022-04-23 | Disposition: A | Discharge status: Home / Self Care | Payer: Medicare Other | Source: Ambulatory Visit | Attending: Physician Assistant | Admitting: Physician Assistant

## 2022-04-23 VITALS — BP 144/82 | HR 64 | Ht 71.0 in | Wt 174.0 lb

## 2022-04-23 DIAGNOSIS — I700 Atherosclerosis of aorta: Secondary | ICD-10-CM | POA: Diagnosis not present

## 2022-04-23 DIAGNOSIS — I10 Essential (primary) hypertension: Secondary | ICD-10-CM | POA: Diagnosis not present

## 2022-04-23 DIAGNOSIS — Z7901 Long term (current) use of anticoagulants: Secondary | ICD-10-CM | POA: Insufficient documentation

## 2022-04-23 DIAGNOSIS — D6869 Other thrombophilia: Secondary | ICD-10-CM | POA: Diagnosis not present

## 2022-04-23 DIAGNOSIS — I480 Paroxysmal atrial fibrillation: Secondary | ICD-10-CM | POA: Diagnosis not present

## 2022-04-23 DIAGNOSIS — I4892 Unspecified atrial flutter: Secondary | ICD-10-CM | POA: Insufficient documentation

## 2022-04-23 DIAGNOSIS — I483 Typical atrial flutter: Secondary | ICD-10-CM | POA: Insufficient documentation

## 2022-04-23 NOTE — Progress Notes (Signed)
Primary Care Physician: Einar Pheasant, MD Primary Cardiologist: none Primary Electrophysiologist: Dr Quentin Ore Referring Physician: Dr Rodolph Bong Alhassan Nathan Watts is a 77 y.o. male with a history of aortic atherosclerosis, HTN, atrial flutter, atrial fibrillation who presents for follow up in the [facility].  The patient was initially diagnosed with atrial fibrillation 12/2021 on a Zio monitor. He was symptomatic with palpitations. Patient is on Eliquis for a CHADS2VASC score of 4. He was seen by Dr Quentin Ore and underwent afib and flutter ablation on 03/26/22.  On follow up today, patient reports that he has done well since his ablation. He has brief palpitations but they have decreased in frequency, duration, and intensity since the ablation. He denies chest pain, swallowing pain, or groin issues.   Today, he denies symptoms of chest pain, shortness of breath, orthopnea, PND, lower extremity edema, dizziness, presyncope, syncope, snoring, daytime somnolence, bleeding, or neurologic sequela. The patient is tolerating medications without difficulties and is otherwise without complaint today.    Atrial Fibrillation Risk Factors:  he does not have symptoms or diagnosis of sleep apnea. he does not have a history of rheumatic fever.   he has a BMI of Body mass index is 24.27 kg/m.Marland Kitchen Filed Weights   04/23/22 1120  Weight: 78.9 kg    Family History  Problem Relation Age of Onset   Prostate cancer Father    Heart disease Father        s/p CABG   Stroke Mother    Colon cancer Neg Hx      Atrial Fibrillation Management history:  Previous antiarrhythmic drugs: none Previous cardioversions: none Previous ablations: 03/26/22 CHADS2VASC score: 4 Anticoagulation history: Eliquis   Past Medical History:  Diagnosis Date   BPH (benign prostatic hyperplasia)    Fatty infiltration of liver    GERD (gastroesophageal reflux disease)    Hypercholesterolemia     Hyperglycemia    Hypertension    Thrombocytopenia (HCC)    Thyroid nodule    Past Surgical History:  Procedure Laterality Date   ATRIAL FIBRILLATION ABLATION N/A 03/26/2022   Procedure: ATRIAL FIBRILLATION ABLATION;  Surgeon: Vickie Epley, MD;  Location: [place] [facility];  Service: Cardiovascular;  Laterality: N/A;   CATARACT EXTRACTION W/PHACO Left 04/20/2022   Procedure: CATARACT EXTRACTION PHACO AND INTRAOCULAR LENS PLACEMENT (IOC) LEFT 13.73 01:12.4;  Surgeon: Birder Robson, MD;  Location: [place];  Service: Ophthalmology;  Laterality: Left;   COLONOSCOPY WITH PROPOFOL N/A 10/02/2014   Procedure: COLONOSCOPY WITH PROPOFOL;  Surgeon: Manya Silvas, MD;  Location: [facility] ENDOSCOPY;  Service: Endoscopy;  Laterality: N/A;   COLONOSCOPY WITH PROPOFOL N/A 06/15/2021   Procedure: COLONOSCOPY WITH PROPOFOL;  Surgeon: Lesly Rubenstein, MD;  Location: [facility] ENDOSCOPY;  Service: Endoscopy;  Laterality: N/A;   ESOPHAGOGASTRODUODENOSCOPY (EGD) WITH PROPOFOL N/A 06/15/2021   Procedure: ESOPHAGOGASTRODUODENOSCOPY (EGD) WITH PROPOFOL;  Surgeon: Lesly Rubenstein, MD;  Location: [facility] ENDOSCOPY;  Service: Endoscopy;  Laterality: N/A;   HOLEP-LASER ENUCLEATION OF THE PROSTATE WITH MORCELLATION N/A 04/24/2021   Procedure: HOLEP-LASER ENUCLEATION OF THE PROSTATE WITH MORCELLATION;  Surgeon: Billey Co, MD;  Location: [facility] ORS;  Service: Urology;  Laterality: N/A;   INGUINAL HERNIA REPAIR     TONSILLECTOMY     TRANSURETHRAL RESECTION OF BLADDER TUMOR WITH MITOMYCIN-C N/A 04/24/2021   Procedure: 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 60 tablet 5   atorvastatin (LIPITOR) 20 MG tablet TAKE ONE TABLET BY MOUTH DAILY  (Patient taking differently: Take 20 mg by mouth at bedtime.) 90 tablet 2   Carboxymethylcellul-Glycerin (REFRESH RELIEVA PF OP) Place 1 drop into both eyes 4 (four) times daily.     cyanocobalamin 1000 MCG tablet Take 1,000 mcg by mouth in the morning.     metroNIDAZOLE (METROGEL) 0.75 % gel Apply 1 application  topically at bedtime. to face     Multiple Vitamin (MULTIVITAMIN WITH MINERALS) TABS tablet Take 1 tablet by mouth in the morning.     Omega-3 Fatty Acids (FISH OIL) 1200 MG CAPS Take 1,200 mg by mouth in the morning.     pantoprazole (PROTONIX) 40 MG tablet Take 1 tablet (40 mg total) by mouth daily. 45 tablet 0   pimecrolimus (ELIDEL) 1 % cream Apply 1 application  topically daily as needed (rosacea).     Probiotic Product (PROBIOTIC PO) Take 1 capsule by mouth in the morning.     Psyllium (METAMUCIL PO) Take 6 g by mouth in the morning. Metamucil Fiber     sulfamethoxazole-trimethoprim (BACTRIM DS) 800-160 MG tablet Take 1 tablet by mouth 2 (two) times daily for 14 days. 28 tablet 0   No current facility-administered medications for this encounter.    Allergies  Allergen Reactions   Crestor [Rosuvastatin] Other (See Comments)    Causes GI upset and anxiety   Hydrocodone-Chlorpheniramine Other (See Comments)    Unknown, possible causes anxiety  possible causes anxiety  Did ok with low dose    Social History   Socioeconomic History   Marital status: Married    Spouse name: Not on file   Number of children: 4   Years of education: Not on file   Highest education level: Not on file  Occupational History   Occupation: retired  Tobacco Use   Smoking status: Former    Packs/day: 1.00    Years: 30.00    Total pack years: 30.00    Types: Cigarettes    Quit date: 04/12/1998    Years since quitting: 24.0    Passive exposure: Past   Smokeless tobacco: Never  Vaping Use   Vaping Use: Never used  Substance and Sexual Activity   Alcohol use: Not Currently    Alcohol/week:  1.0 standard drink of alcohol    Types: 1 Cans of beer per week    Comment: OCC   Drug use: Never   Sexual activity: Never  Other Topics Concern   Not on file  Social History Narrative   He is married. Has four children   Social Determinants of Health   Financial Resource Strain: Low Risk  (02/04/2020)   Overall Financial Resource Strain (CARDIA)    Difficulty of Paying Living Expenses: Not hard at all  Food Insecurity: No Food Insecurity (09/18/2021)   Hunger Vital Sign    Worried About Running Out of Food in the Last Year: Never true    Ran Out of Food in the Last Year: Never true  Transportation Needs: No Transportation Needs (09/18/2021)   PRAPARE - Hydrologist (Medical): No    Lack of Transportation (Non-Medical): No  Physical Activity: Sufficiently Active (09/18/2021)   Exercise Vital Sign    Days of Exercise per Week: 7  days    Minutes of Exercise per Session: 120 min  Stress: No Stress Concern Present (09/18/2021)   [place]    Feeling of Stress : Not at all  Social Connections: Not on file  Intimate Partner Violence: Not At Risk (09/18/2021)   Humiliation, Afraid, Rape, and Kick questionnaire    Fear of Current or Ex-Partner: No    Emotionally Abused: No    Physically Abused: No    Sexually Abused: No     ROS- All systems are reviewed and negative except as per the HPI above.  Physical Exam: Vitals:   04/23/22 1120  BP: (!) 144/82  Pulse: 64  Weight: 78.9 kg  Height: '5\' 11"'$  (1.803 m)    GEN- The patient is a well appearing elderly male, alert and oriented x 3 today.   Head- normocephalic, atraumatic Eyes-  Sclera clear, conjunctiva pink Ears- hearing intact Oropharynx- clear Neck- supple  Lungs- Clear to ausculation bilaterally, normal work of breathing Heart- Regular rate and rhythm, no murmurs, rubs or gallops  GI- soft, NT, ND, + BS Extremities- no clubbing,  cyanosis, or edema MS- no significant deformity or atrophy Skin- no rash or lesion Psych- euthymic mood, full affect Neuro- strength and sensation are intact  Wt Readings from Last 3 Encounters:  04/23/22 78.9 kg  04/20/22 78.5 kg  03/30/22 74.8 kg    EKG today demonstrates  SR, 1st degree AV block Vent. rate 64 BPM PR interval 208 ms QRS duration 90 ms QT/QTcB 396/408 ms  Echo 01/11/22 demonstrated   1. Left ventricular ejection fraction, by estimation, is 55 to 60%. The  left ventricle has normal function. The left ventricle has no regional  wall motion abnormalities. Left ventricular diastolic parameters were  normal. The average left ventricular  global longitudinal strain is -19.2 %. The global longitudinal strain is  normal.   2. Right ventricular systolic function is normal. The right ventricular  size is normal. There is normal pulmonary artery systolic pressure.   3. Left atrial size was moderately dilated.   4. The mitral valve is normal in structure. Mild mitral valve  regurgitation. No evidence of mitral stenosis.   5. The aortic valve is normal in structure. Aortic valve regurgitation is  not visualized. No aortic stenosis is present.   6. The inferior vena cava is dilated in size with >50% respiratory  variability, suggesting right atrial pressure of 8 mmHg.   Epic records are reviewed at length today  CHA2DS2-VASc Score = 4  The patient's score is based upon: CHF History: 0 HTN History: 1 Diabetes History: 0 Stroke History: 0 Vascular Disease History: 1 Age Score: 2 Gender Score: 0       ASSESSMENT AND PLAN: 1. Paroxysmal Atrial Fibrillation/atrial flutter The patient's CHA2DS2-VASc score is 4, indicating a 4.8% annual risk of stroke.   S/p afib and flutter ablation 03/26/22 Patient appears to be maintaining SR with only brief palpitations, improved since ablation. Continue Eliquis 5 mg BID with no missed doses for 3 months post ablation.   2.  Secondary Hypercoagulable State (ICD10:  D68.69) The patient is at significant risk for stroke/thromboembolism based upon his CHA2DS2-VASc Score of 4.  Continue Apixaban (Eliquis).   3. HTN Stable, no changes today.  4. Aortic atherosclerosis  On statin   Follow up with Dr Quentin Ore as scheduled.    [facility] [address] [place], [place] [address]  [phone] 04/23/2022 11:50 AM

## 2022-04-26 NOTE — Anesthesia Postprocedure Evaluation (Signed)
Anesthesia Post Note  Patient: Eusevio Schriver  Procedure(s) Performed: CATARACT EXTRACTION PHACO AND INTRAOCULAR LENS PLACEMENT (IOC) LEFT 13.73 01:12.4 (Left: Eye)  Patient location during evaluation: PACU Anesthesia Type: MAC Level of consciousness: awake and alert Pain management: pain level controlled Vital Signs Assessment: post-procedure vital signs reviewed and stable Respiratory status: spontaneous breathing, nonlabored ventilation, respiratory function stable and patient connected to nasal cannula oxygen Cardiovascular status: blood pressure returned to baseline and stable Postop Assessment: no apparent nausea or vomiting Anesthetic complications: no   No notable events documented.   Last Vitals:  Vitals:   04/20/22 1403 04/20/22 1407  BP: 106/69 107/75  Pulse: 60 62  Resp: 12 15  Temp: 36.6 C 36.6 C  SpO2: 100% 97%    Last Pain:  Vitals:   04/20/22 1407  PainSc: 0-No pain                 Molli Barrows

## 2022-04-29 DIAGNOSIS — H2512 Age-related nuclear cataract, left eye: Secondary | ICD-10-CM | POA: Diagnosis not present

## 2022-05-03 NOTE — Discharge Instructions (Signed)
   Cataract Surgery, Care After ? ?This sheet gives you information about how to care for yourself after your surgery.  Your ophthalmologist may also give you more specific instructions.  If you have problems or questions, contact your doctor at [facility], [phone]. ? ?What can I expect after the surgery? ?It is common to have: ?Itching ?Foreign body sensation (feels like a grain of sand in the eye) ?Watery discharge (excess tearing) ?Sensitivity to light and touch ?Bruising in or around the eye ?Mild blurred vision ? ?Follow these instructions at home: ?Do not touch or rub your eyes. ?You may be told to wear a protective shield or sunglasses to protect your eyes. ?Do not put a contact lens in the operative eye unless your doctor approves. ?Keep the lids and face clean and dry. ?Do not allow water to hit you directly in the face while showering. ?Keep soap and shampoo out of your eyes. ?Do not use eye makeup for 1 week. ? ?Check your eye every day for signs of infection.  Watch for: ?Redness, swelling, or pain. ?Fluid, blood or pus. ?Worsening vision. ?Worsening sensitivity to light or touch. ? ?Activity: ?During the first day, avoid bending over and reading.  You may resume reading and bending the next day. ?Do not drive or use heavy machinery for at least 24 hours. ?Avoid strenuous activities for 1 week.  Activities such as walking, treadmill, exercise bike, and climbing stairs are okay. ?Do not lift heavy (>20 pound) objects for 1 week. ?Do not do yardwork, gardening, or dirty housework (mopping, cleaning bathrooms, vacuuming, etc.) for 1 week. ?Do not swim or use a hot tub for 2 weeks. ?Ask your doctor when you can return to work. ? ?General Instructions: ?Take or apply prescription and over-the-counter medicines as directed by your doctor, including eyedrops and ointments. ?Resume medications discontinued prior to surgery, unless told otherwise by your doctor. ?Keep all follow up appointments as  scheduled. ? ?Contact a health care provider if: ?You have increased bruising around your eye. ?You have pain that is not helped with medication. ?You have a fever. ?You have fluid, pus, or blood coming from your eye or incision. ?Your sensitivity to light gets worse. ?You have spots (floaters) of flashing lights in your vision. ?You have nausea or vomiting. ? ?Go to the nearest emergency room or call 911 if: ?You have sudden loss of vision. ?You have severe, worsening eye pain. ? ?

## 2022-05-04 ENCOUNTER — Encounter
Admission: RE | Disposition: A | Discharge status: Home / Self Care | Payer: Self-pay | Source: Home / Self Care | Attending: Ophthalmology

## 2022-05-04 ENCOUNTER — Ambulatory Visit
Admission: RE | Admit: 2022-05-04 | Discharge: 2022-05-04 | Disposition: A | Discharge status: Home / Self Care | Payer: Medicare Other | Attending: Ophthalmology | Admitting: Ophthalmology

## 2022-05-04 ENCOUNTER — Ambulatory Visit: Payer: Medicare Other | Admitting: Anesthesiology

## 2022-05-04 ENCOUNTER — Encounter: Payer: Self-pay | Admitting: Ophthalmology

## 2022-05-04 ENCOUNTER — Other Ambulatory Visit: Payer: Self-pay

## 2022-05-04 DIAGNOSIS — K219 Gastro-esophageal reflux disease without esophagitis: Secondary | ICD-10-CM | POA: Insufficient documentation

## 2022-05-04 DIAGNOSIS — H269 Unspecified cataract: Secondary | ICD-10-CM | POA: Diagnosis not present

## 2022-05-04 DIAGNOSIS — I4891 Unspecified atrial fibrillation: Secondary | ICD-10-CM | POA: Diagnosis not present

## 2022-05-04 DIAGNOSIS — Z87891 Personal history of nicotine dependence: Secondary | ICD-10-CM | POA: Insufficient documentation

## 2022-05-04 DIAGNOSIS — I2510 Atherosclerotic heart disease of native coronary artery without angina pectoris: Secondary | ICD-10-CM | POA: Diagnosis not present

## 2022-05-04 DIAGNOSIS — I10 Essential (primary) hypertension: Secondary | ICD-10-CM | POA: Insufficient documentation

## 2022-05-04 DIAGNOSIS — Z7901 Long term (current) use of anticoagulants: Secondary | ICD-10-CM | POA: Insufficient documentation

## 2022-05-04 DIAGNOSIS — N400 Enlarged prostate without lower urinary tract symptoms: Secondary | ICD-10-CM | POA: Insufficient documentation

## 2022-05-04 DIAGNOSIS — H2511 Age-related nuclear cataract, right eye: Secondary | ICD-10-CM | POA: Diagnosis not present

## 2022-05-04 DIAGNOSIS — E119 Type 2 diabetes mellitus without complications: Secondary | ICD-10-CM

## 2022-05-04 HISTORY — PX: CATARACT EXTRACTION W/PHACO: SHX586

## 2022-05-04 SURGERY — PHACOEMULSIFICATION, CATARACT, WITH IOL INSERTION
Anesthesia: Monitor Anesthesia Care | Site: Eye | Laterality: Right

## 2022-05-04 MED ORDER — SIGHTPATH DOSE#1 BSS IO SOLN
INTRAOCULAR | Status: DC | PRN
  Administered 2022-05-04: 64 mL via OPHTHALMIC

## 2022-05-04 MED ORDER — SIGHTPATH DOSE#1 NA CHONDROIT SULF-NA HYALURON 40-17 MG/ML IO SOLN
INTRAOCULAR | Status: DC | PRN
  Administered 2022-05-04: 1 mL via INTRAOCULAR

## 2022-05-04 MED ORDER — MIDAZOLAM HCL 2 MG/2ML IJ SOLN
INTRAMUSCULAR | Status: DC | PRN
  Administered 2022-05-04: 1 mg via INTRAVENOUS

## 2022-05-04 MED ORDER — FENTANYL CITRATE (PF) 100 MCG/2ML IJ SOLN
INTRAMUSCULAR | Status: DC | PRN
  Administered 2022-05-04: 50 ug via INTRAVENOUS

## 2022-05-04 MED ORDER — ARMC OPHTHALMIC DILATING DROPS
1.0000 | OPHTHALMIC | Status: DC | PRN
  Administered 2022-05-04 (×3): 1 via OPHTHALMIC

## 2022-05-04 MED ORDER — ONDANSETRON HCL 4 MG/2ML IJ SOLN: 4.0000 mg | Freq: Once | INTRAMUSCULAR | Status: DC | PRN

## 2022-05-04 MED ORDER — BRIMONIDINE TARTRATE-TIMOLOL 0.2-0.5 % OP SOLN
OPHTHALMIC | Status: DC | PRN
  Administered 2022-05-04: 1 [drp] via OPHTHALMIC

## 2022-05-04 MED ORDER — TETRACAINE HCL 0.5 % OP SOLN
1.0000 [drp] | OPHTHALMIC | Status: DC | PRN
  Administered 2022-05-04 (×3): 1 [drp] via OPHTHALMIC

## 2022-05-04 MED ORDER — LACTATED RINGERS IV SOLN: INTRAVENOUS | Status: DC

## 2022-05-04 MED ORDER — MOXIFLOXACIN HCL 0.5 % OP SOLN
OPHTHALMIC | Status: DC | PRN
  Administered 2022-05-04: .2 mL via OPHTHALMIC

## 2022-05-04 MED ORDER — SIGHTPATH DOSE#1 BSS IO SOLN
INTRAOCULAR | Status: DC | PRN
  Administered 2022-05-04: 1 mL

## 2022-05-04 MED ORDER — ACETAMINOPHEN 325 MG PO TABS: 650.0000 mg | ORAL_TABLET | Freq: Once | ORAL | Status: DC | PRN

## 2022-05-04 MED ORDER — ACETAMINOPHEN 160 MG/5ML PO SOLN: 325.0000 mg | ORAL | Status: DC | PRN

## 2022-05-04 MED ORDER — SIGHTPATH DOSE#1 BSS IO SOLN
INTRAOCULAR | Status: DC | PRN
  Administered 2022-05-04: 15 mL

## 2022-05-04 SURGICAL SUPPLY — 15 items
CANNULA ANT/CHMB 27G (MISCELLANEOUS) IMPLANT
CANNULA ANT/CHMB 27GA (MISCELLANEOUS) IMPLANT
CATARACT SUITE SIGHTPATH (MISCELLANEOUS) ×1 IMPLANT
FEE CATARACT SUITE SIGHTPATH (MISCELLANEOUS) ×1 IMPLANT
GLOVE BIOGEL PI IND STRL 8 (GLOVE) ×1 IMPLANT
GLOVE SURG ENC TEXT LTX SZ8 (GLOVE) ×1 IMPLANT
LENS IOL TECNIS EYHANCE 20.0 (Intraocular Lens) IMPLANT
NDL FILTER BLUNT 18X1 1/2 (NEEDLE) ×1 IMPLANT
NEEDLE FILTER BLUNT 18X1 1/2 (NEEDLE) ×1 IMPLANT
PACK VIT ANT 23G (MISCELLANEOUS) IMPLANT
RING MALYGIN (MISCELLANEOUS) IMPLANT
SUT ETHILON 10-0 CS-B-6CS-B-6 (SUTURE)
SUTURE EHLN 10-0 CS-B-6CS-B-6 (SUTURE) IMPLANT
SYR 3ML LL SCALE MARK (SYRINGE) ×1 IMPLANT
WATER STERILE IRR 250ML POUR (IV SOLUTION) ×1 IMPLANT

## 2022-05-04 NOTE — Transfer of Care (Signed)
Immediate Anesthesia Transfer of Care Note  Patient: Nathan Watts  Procedure(s) Performed: CATARACT EXTRACTION PHACO AND INTRAOCULAR LENS PLACEMENT (IOC) RIGHT (Right: Eye)  Patient Location: PACU  Anesthesia Type:MAC  Level of Consciousness: awake and alert   Airway & Oxygen Therapy: Patient Spontanous Breathing  Post-op Assessment: Report given to RN  Post vital signs: Reviewed and stable  Last Vitals:  Vitals Value Taken Time  BP 118/72 05/04/22 1208  Temp 36.3 C 05/04/22 1207  Pulse 56 05/04/22 1209  Resp 14 05/04/22 1209  SpO2 100 % 05/04/22 1209  Vitals shown include unvalidated device data.  Last Pain:  Vitals:   05/04/22 1207  PainSc: 0-No pain         Complications: No notable events documented.

## 2022-05-04 NOTE — Op Note (Signed)
PREOPERATIVE DIAGNOSIS:  Nuclear sclerotic cataract of the right eye.   POSTOPERATIVE DIAGNOSIS:  Cataract   OPERATIVE PROCEDURE:ORPROCALL@   SURGEON:  Birder Robson, MD.   ANESTHESIA:  Anesthesiologist: Darrin Nipper, MD CRNA: Gigi Gin, CRNA  1.      Managed anesthesia care. 2.      0.79m of Shugarcaine was instilled in the eye following the paracentesis.   COMPLICATIONS:  None.   TECHNIQUE:   Stop and chop   DESCRIPTION OF PROCEDURE:  The patient was examined and consented in the preoperative holding area where the aforementioned topical anesthesia was applied to the right eye and then brought back to the Operating Room where the right eye was prepped and draped in the usual sterile ophthalmic fashion and a lid speculum was placed. A paracentesis was created with the side port blade and the anterior chamber was filled with viscoelastic. A near clear corneal incision was performed with the steel keratome. A continuous curvilinear capsulorrhexis was performed with a cystotome followed by the capsulorrhexis forceps. Hydrodissection and hydrodelineation were carried out with BSS on a blunt cannula. The lens was removed in a stop and chop  technique and the remaining cortical material was removed with the irrigation-aspiration handpiece. The capsular bag was inflated with viscoelastic and the Technis ZCB00  lens was placed in the capsular bag without complication. The remaining viscoelastic was removed from the eye with the irrigation-aspiration handpiece. The wounds were hydrated. The anterior chamber was flushed with BSS and the eye was inflated to physiologic pressure. 0.167mof Vigamox was placed in the anterior chamber. The wounds were found to be water tight. The eye was dressed with Combigan. The patient was given protective glasses to wear throughout the day and a shield with which to sleep tonight. The patient was also given drops with which to begin a drop regimen today and will  follow-up with me in one day. Implant Name Type Inv. Item Serial No. Manufacturer Lot No. LRB No. Used Action  LENS IOL TECNIS EYHANCE 20.0 - [id number] Lens LENS IOL TECNIS EYHANCE 20.0 [id number]  Right 1 Implanted   Procedure(s) with comments: CATARACT EXTRACTION PHACO AND INTRAOCULAR LENS PLACEMENT (IOC) RIGHT (Right) - 9.42 0:54.4  Electronically signed: WiBirder Robson/23/2024 12:06 PM

## 2022-05-04 NOTE — Anesthesia Postprocedure Evaluation (Signed)
Anesthesia Post Note  Patient: Nathan Watts  Procedure(s) Performed: CATARACT EXTRACTION PHACO AND INTRAOCULAR LENS PLACEMENT (IOC) RIGHT (Right: Eye)  Patient location during evaluation: PACU Anesthesia Type: MAC Level of consciousness: awake and alert, oriented and patient cooperative Pain management: pain level controlled Vital Signs Assessment: post-procedure vital signs reviewed and stable Respiratory status: spontaneous breathing, nonlabored ventilation and respiratory function stable Cardiovascular status: blood pressure returned to baseline and stable Postop Assessment: adequate PO intake Anesthetic complications: no   No notable events documented.   Last Vitals:  Vitals:   05/04/22 1207 05/04/22 1213  BP: 118/72 117/73  Pulse: (!) 57 (!) 59  Resp: 10 18  Temp: (!) 36.3 C (!) 36.3 C  SpO2: 99% 98%    Last Pain:  Vitals:   05/04/22 1213  PainSc: 0-No pain                 Darrin Nipper

## 2022-05-04 NOTE — Anesthesia Preprocedure Evaluation (Signed)
Anesthesia Evaluation  Patient identified by MRN, date of birth, ID band Patient awake    Reviewed: Allergy & Precautions, NPO status , Patient's Chart, lab work & pertinent test results  History of Anesthesia Complications Negative for: history of anesthetic complications  Airway Mallampati: III   Neck ROM: Full    Dental no notable dental hx.    Pulmonary former smoker (quit 2000)   Pulmonary exam normal breath sounds clear to auscultation       Cardiovascular hypertension, + CAD  Normal cardiovascular exam+ dysrhythmias (a fib on Eliquis)  Rhythm:Regular Rate:Normal     Neuro/Psych negative neurological ROS     GI/Hepatic ,GERD  ,,  Endo/Other  negative endocrine ROS    Renal/GU negative Renal ROS   BPH    Musculoskeletal   Abdominal   Peds  Hematology negative hematology ROS (+)   Anesthesia Other Findings   Reproductive/Obstetrics                             Anesthesia Physical Anesthesia Plan  ASA: 2  Anesthesia Plan: MAC   Post-op Pain Management:    Induction: Intravenous  PONV Risk Score and Plan: 1 and Treatment may vary due to age or medical condition, Midazolam and TIVA  Airway Management Planned: Natural Airway and Nasal Cannula  Additional Equipment:   Intra-op Plan:   Post-operative Plan:   Informed Consent: I have reviewed the patients History and Physical, chart, labs and discussed the procedure including the risks, benefits and alternatives for the proposed anesthesia with the patient or authorized representative who has indicated his/her understanding and acceptance.     Dental advisory given  Plan Discussed with: CRNA  Anesthesia Plan Comments: (LMA/GETA backup discussed.  Patient consented for risks of anesthesia including but not limited to:  - adverse reactions to medications - damage to eyes, teeth, lips or other oral mucosa - nerve damage  due to positioning  - sore throat or hoarseness - damage to heart, brain, nerves, lungs, other parts of body or loss of life  Informed patient about role of CRNA in peri- and intra-operative care.  Patient voiced understanding.)       Anesthesia Quick Evaluation

## 2022-05-04 NOTE — H&P (Signed)
Nathan Watts   Primary Care Physician:  Einar Pheasant, MD Ophthalmologist: Dr. George Ina  Pre-Procedure History & Physical: HPI:  Nathan Watts is a 77 y.o. male here for cataract surgery.   Past Medical History:  Diagnosis Date   BPH (benign prostatic hyperplasia)    Fatty infiltration of liver    GERD (gastroesophageal reflux disease)    Hypercholesterolemia    Hyperglycemia    Hypertension    Thrombocytopenia (HCC)    Thyroid nodule     Past Surgical History:  Procedure Laterality Date   ATRIAL FIBRILLATION ABLATION N/A 03/26/2022   Procedure: ATRIAL FIBRILLATION ABLATION;  Surgeon: Vickie Epley, MD;  Location: [place] [facility];  Service: Cardiovascular;  Laterality: N/A;   CATARACT EXTRACTION W/PHACO Left 04/20/2022   Procedure: CATARACT EXTRACTION PHACO AND INTRAOCULAR LENS PLACEMENT (IOC) LEFT 13.73 01:12.4;  Surgeon: Birder Robson, MD;  Location: [facility];  Service: Ophthalmology;  Laterality: Left;   COLONOSCOPY WITH PROPOFOL N/A 10/02/2014   Procedure: COLONOSCOPY WITH PROPOFOL;  Surgeon: Manya Silvas, MD;  Location: [facility];  Service: Endoscopy;  Laterality: N/A;   COLONOSCOPY WITH PROPOFOL N/A 06/15/2021   Procedure: COLONOSCOPY WITH PROPOFOL;  Surgeon: Lesly Rubenstein, MD;  Location: [facility] END[facility]COPY;  Service: Endoscopy;  Laterality: N/A;   ESOPHAGOGASTRODUODENOSCOPY (EGD) WITH PROPOFOL N/A 06/15/2021   Procedure: ESOPHAGOGASTRODUODENOSCOPY (EGD) WITH PROPOFOL;  Surgeon: Lesly Rubenstein, MD;  Location: [facility] END[facility]COPY;  Service: Endoscopy;  Laterality: N/A;   HOLEP-LASER ENUCLEATION OF THE PROSTATE WITH MORCELLATION N/A 04/24/2021   Procedure: HOLEP-LASER ENUCLEATION OF THE PROSTATE WITH MORCELLATION;  Surgeon: Billey Co, MD;  Location: [facility];  Service: Urology;  Laterality: N/A;   INGUINAL HERNIA REPAIR     TONSILLECTOMY     TRANSURETHRAL RESECTION OF BLADDER TUMOR WITH MITOMYCIN-C N/A 04/24/2021   Procedure:  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 Authorizing Provider  apixaban (ELIQUIS) 5 MG TABS tablet Take 1 tablet (5 mg total) by mouth 2 (two) times daily. 01/25/22  Yes Agbor-Etang, Aaron Edelman, MD  atorvastatin (LIPITOR) 20 MG tablet TAKE ONE TABLET BY MOUTH DAILY Patient taking differently: Take 20 mg by mouth at bedtime. 12/28/21  Yes Einar Pheasant, MD  cyanocobalamin 1000 MCG tablet Take 1,000 mcg by mouth in the morning.   Yes [provider]  metroNIDAZOLE (METROGEL) 0.75 % gel Apply 1 application  topically at bedtime. to face 04/27/19  Yes [provider]  Multiple Vitamin (MULTIVITAMIN WITH MINERALS) TABS tablet Take 1 tablet by mouth in the morning.   Yes [provider]  Omega-3 Fatty Acids (FISH OIL) 1200 MG CAPS Take 1,200 mg by mouth in the morning.   Yes [provider]  pantoprazole (PROTONIX) 40 MG tablet Take 1 tablet (40 mg total) by mouth daily. 03/26/22 05/10/22 Yes TillerySatira Mccallum, PA-C  Probiotic Product (PROBIOTIC PO) Take 1 capsule by mouth in the morning.   Yes [provider]  Psyllium (METAMUCIL PO) Take 6 g by mouth in the morning. Metamucil Fiber   Yes [provider]  Carboxymethylcellul-Glycerin (REFRESH RELIEVA PF OP) Place 1 drop into both eyes 4 (four) times daily.    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 04/26/19   [provider]  Allergies as of 03/15/2022 - Review Complete 03/15/2022  Allergen Reaction Noted   Crestor [rosuvastatin] Other (See Comments) 03/14/2012   Hydrocodone-chlorpheniramine Other (See Comments) 07/19/2014    Family History  Problem Relation Age of Onset   Prostate cancer Father    Heart disease Father         s/p CABG   Stroke Mother    Colon cancer Neg Hx     Social History   Socioeconomic History   Marital status: Married    Spouse name: Not on file   Number of children: 4   Years of education: Not on file   Highest education level: Not on file  Occupational History   Occupation: retired  Tobacco Use   Smoking status: Former    Packs/day: 1.00    Years: 30.00    Total pack years: 30.00    Types: Cigarettes    Quit date: 04/12/1998    Years since quitting: 24.0    Passive exposure: Past   Smokeless tobacco: Never  Vaping Use   Vaping Use: Never used  Substance and Sexual Activity   Alcohol use: Not Currently    Alcohol/week: 1.0 standard drink of alcohol    Types: 1 Cans of beer per week    Comment: OCC   Drug use: Never   Sexual activity: Never  Other Topics Concern   Not on file  Social History Narrative   He is married. Has four children   Social Determinants of Health   Financial Resource Strain: Low Risk  (02/04/2020)   Overall Financial Resource Strain (CARDIA)    Difficulty of Paying Living Expenses: Not hard at all  Food Insecurity: No Food Insecurity (09/18/2021)   Hunger Vital Sign    Worried About Running Out of Food in the Last Year: Never true    Ran Out of Food in the Last Year: Never true  Transportation Needs: No Transportation Needs (09/18/2021)   PRAPARE - Hydrologist (Medical): No    Lack of Transportation (Non-Medical): No  Physical Activity: Sufficiently Active (09/18/2021)   Exercise Vital Sign    Days of Exercise per Week: 7 days    Minutes of Exercise per Session: 120 min  Stress: No Stress Concern Present (09/18/2021)   [place]    Feeling of Stress : Not at all  Social Connections: Not on file  Intimate Partner Violence: Not At Risk (09/18/2021)   Humiliation, Afraid, Rape, and Kick questionnaire    Fear of Current or Ex-Partner: No     Emotionally Abused: No    Physically Abused: No    Sexually Abused: No    Review of Systems: See HPI, otherwise negative ROS  Physical Exam: BP 116/74   Pulse (!) 56   Temp (!) 97.4 F (36.3 C)   Wt 78.9 kg   SpO2 99%   BMI 24.26 kg/m  General:   Alert, cooperative in NAD Head:  Normocephalic and atraumatic. Respiratory:  Normal work of breathing. Cardiovascular:  RRR  Impression/Plan: Nathan Watts is here for cataract surgery.  Risks, benefits, limitations, and alternatives regarding cataract surgery have been reviewed with the patient.  Questions have been answered.  All parties agreeable.   Birder Robson, MD  05/04/2022, 11:37 AM

## 2022-05-05 ENCOUNTER — Encounter: Payer: Self-pay | Admitting: Cardiology

## 2022-05-06 ENCOUNTER — Encounter: Payer: Self-pay | Admitting: Ophthalmology

## 2022-05-17 ENCOUNTER — Encounter: Payer: Self-pay | Admitting: Ophthalmology

## 2022-05-17 NOTE — Addendum Note (Signed)
Addendum  created 05/17/22 0848 by Gigi Gin, CRNA   Intraprocedure Event deleted, Intraprocedure Event edited

## 2022-06-10 ENCOUNTER — Ambulatory Visit: Payer: Medicare Other | Admitting: Urology

## 2022-06-10 VITALS — BP 137/76 | HR 48 | Wt 168.0 lb

## 2022-06-10 DIAGNOSIS — Z8551 Personal history of malignant neoplasm of bladder: Secondary | ICD-10-CM

## 2022-06-10 DIAGNOSIS — N5082 Scrotal pain: Secondary | ICD-10-CM

## 2022-06-10 DIAGNOSIS — R109 Unspecified abdominal pain: Secondary | ICD-10-CM

## 2022-06-10 DIAGNOSIS — R319 Hematuria, unspecified: Secondary | ICD-10-CM | POA: Diagnosis not present

## 2022-06-10 DIAGNOSIS — Z9889 Other specified postprocedural states: Secondary | ICD-10-CM

## 2022-06-10 LAB — URINALYSIS, COMPLETE
Bilirubin, UA: NEGATIVE
Glucose, UA: NEGATIVE
Ketones, UA: NEGATIVE
Leukocytes,UA: NEGATIVE
Nitrite, UA: NEGATIVE
Protein,UA: NEGATIVE
Specific Gravity, UA: <1.005 — ABNORMAL LOW (ref 1.005–1.030)
Urobilinogen, Ur: 0.2 mg/dL (ref 0.2–1.0)
pH, UA: 5.5 (ref 5.0–7.5)

## 2022-06-10 LAB — MICROSCOPIC EXAMINATION: Bacteria, UA: NONE SEEN

## 2022-06-10 MED ORDER — SULFAMETHOXAZOLE-TRIMETHOPRIM 800-160 MG PO TABS
1.0000 | ORAL_TABLET | Freq: Once | ORAL | Status: AC
  Administered 2022-06-10: 1 via ORAL

## 2022-06-10 NOTE — Progress Notes (Signed)
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  Simultaneous HOLEP(path BPH)  Bactrim given for prophylaxis  After informed consent and discussion of the procedure and its risks, Nathan Watts was positioned and prepped in the standard fashion. Cystoscopy was performed with a flexible cystoscope. The urethra, bladder neck and entire bladder was visualized in a standard fashion.  Open prostatic fossa, minimal regrowth of adenoma.  The ureteral orifices were visualized in their normal location and orientation.  Moderate bladder trabeculations, no suspicious lesions, no abnormalities on retroflexion.   Findings: No evidence of recurrence  Assessment and Plan: He also reports some intermittent scrotal and groin pain, especially with exercise and physical activity.  Urinalysis today is completely benign.  Pelvic floor stretching exercises were provided, this has been a chronic issue and seems to flareup with physical activity.  Denies any urinary symptoms and continues to void with a strong stream, no incontinence.  RTC 6 months cystoscopy, if benign will space to yearly cystoscopy  Nickolas Madrid, MD 06/10/2022

## 2022-06-14 ENCOUNTER — Ambulatory Visit: Admission: EM | Admit: 2022-06-14 | Payer: Medicare Other | Source: Home / Self Care

## 2022-06-14 ENCOUNTER — Ambulatory Visit
Admission: EM | Admit: 2022-06-14 | Discharge: 2022-06-14 | Disposition: A | Discharge status: Home / Self Care | Payer: Medicare Other | Attending: Emergency Medicine | Admitting: Emergency Medicine

## 2022-06-14 DIAGNOSIS — J069 Acute upper respiratory infection, unspecified: Secondary | ICD-10-CM | POA: Insufficient documentation

## 2022-06-14 DIAGNOSIS — Z1152 Encounter for screening for COVID-19: Secondary | ICD-10-CM | POA: Diagnosis not present

## 2022-06-14 LAB — SARS CORONAVIRUS 2 BY RT PCR: SARS Coronavirus 2 by RT PCR: NEGATIVE

## 2022-06-14 MED ORDER — IPRATROPIUM BROMIDE 0.06 % NA SOLN: 2.0000 | Freq: Four times a day (QID) | NASAL | 12 refills | Status: DC

## 2022-06-14 NOTE — Discharge Instructions (Addendum)
Your COVID test today was negative.  I do feel that you have a viral upper respiratory infection.  Take over-the-counter Claritin, Zyrtec, or Allegra to help with the congestion.  You may also perform sinus irrigation using a NeilMed sinus rinse kit and distilled water to help alleviate mucus burden.  You may also use the Atrovent nasal spray as prescribed.  This will decrease nasal congestion and drainage.  You can instill 2 squirts in each nostril every 6 hours as needed.  If you develop any new or worsening symptoms please return for reevaluation or see your primary care provider.

## 2022-06-14 NOTE — ED Provider Notes (Signed)
[facility]    CSN: [id number] Arrival date & time: 06/14/22  0809      History   Chief Complaint Chief Complaint  Patient presents with   Facial Pain   Nasal Congestion    HPI Albaro Jove is a 77 y.o. male.   HPI  77 year old male here for evaluation of sinus pressure.  The patient has a past medical history significant for paroxysmal A-fib, chronic prostatitis, BPH, hypercholesterolemia, and GERD presenting for evaluation of 2 days worth of nasal congestion with sinus pressure and bloody discharge from the right nare.  He denies any fever, ear pain, sore throat, cough.  He has been taking Mucinex without relief of symptoms.  Past Medical History:  Diagnosis Date   BPH (benign prostatic hyperplasia)    Fatty infiltration of liver    GERD (gastroesophageal reflux disease)    Hypercholesterolemia    Hyperglycemia    Hypertension    Thrombocytopenia (HCC)    Thyroid nodule     Patient Active Problem List   Diagnosis Date Noted   Paroxysmal atrial fibrillation ([place]) 04/23/2022   Hypercoagulable state due to paroxysmal atrial fibrillation ([place]) 04/23/2022   Typical atrial flutter ([place]) 04/23/2022   Suprapubic pain 03/10/2022   Thrombocytopenia ([place]) 01/13/2022   Palpitations 12/12/2021   History of bladder cancer 09/06/2021   Gastroesophageal reflux disease 04/02/2021   Dysuria 08/26/2020   BPH with obstruction/lower urinary tract symptoms 03/27/2018   Arthritis of carpometacarpal [facility]) joint of right thumb 05/18/2017   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   Fatty infiltration of liver 02/10/2016   Chronic prostatitis 05/06/2015   Aortic atherosclerosis ([place]) 03/13/2015   Coronary artery disease 03/13/2015   History of tobacco abuse 03/13/2015   Hypertension, essential 03/05/2015   Incomplete emptying of bladder 02/27/2015   Elevated prostate specific antigen (PSA) 02/27/2015   Health care maintenance 10/09/2014   Plantar fasciitis  12/31/2012   Thyroid nodule 03/14/2012   Hyperglycemia 03/14/2012   Hypercholesterolemia 03/14/2012    Past Surgical History:  Procedure Laterality Date   ATRIAL FIBRILLATION ABLATION N/A 03/26/2022   Procedure: ATRIAL FIBRILLATION ABLATION;  Surgeon: Vickie Epley, MD;  Location: [place] [facility];  Service: Cardiovascular;  Laterality: N/A;   CATARACT EXTRACTION W/PHACO Left 04/20/2022   Procedure: CATARACT EXTRACTION PHACO AND INTRAOCULAR LENS PLACEMENT (IOC) LEFT 13.73 01:12.4;  Surgeon: Birder Robson, MD;  Location: [place];  Service: Ophthalmology;  Laterality: Left;   CATARACT EXTRACTION W/PHACO Right 05/04/2022   Procedure: CATARACT EXTRACTION PHACO AND INTRAOCULAR LENS PLACEMENT ([place]) RIGHT;  Surgeon: Birder Robson, MD;  Location: [place];  Service: Ophthalmology;  Laterality: Right;  9.42 0:54.4   COLONOSCOPY WITH PROPOFOL N/A 10/02/2014   Procedure: COLONOSCOPY WITH PROPOFOL;  Surgeon: Manya Silvas, MD;  Location[facility];  Service: Endoscopy;  Laterality: N/A;   COLONOSCOPY WITH PROPOFOL N/A 06/15/2021   Procedure: COLONOSCOPY WITH PROPOFOL;  Surgeon: Lesly Rubenstein, MD;  Location: [facility] ENDOSCOPY;  Service: Endoscopy;  Laterality: N/A;   ESOPHAGOGASTRODUODENOSCOPY (EGD) WITH PROPOFOL N/A 06/15/2021   Procedure: ESOPHAGOGASTRODUODENOSCOPY (EGD) WITH PROPOFOL;  Surgeon: Lesly Rubenstein, MD;  Location: [facility] ENDOSCOPY;  Service: Endoscopy;  Laterality: N/A;   HOLEP-LASER ENUCLEATION OF THE PROSTATE WITH MORCELLATION N/A 04/24/2021   Procedure: HOLEP-LASER ENUCLEATION OF THE PROSTATE WITH MORCELLATION;  Surgeon: Billey Co, MD;  Location: [facility] ORS;  Service: Urology;  Laterality: N/A;   INGUINAL HERNIA REPAIR     TONSILLECTOMY     TRANSURETHRAL RESECTION  OF BLADDER TUMOR WITH MITOMYCIN-C N/A 04/24/2021   Procedure: 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;  Surgeon: Billey Co, MD;  Location: [facility] ORS;   Service: Urology;  Laterality: N/A;       Home Medications    Prior to Admission medications   Medication Sig Start Date End Date Taking? Authorizing Provider  apixaban (ELIQUIS) 5 MG TABS tablet Take 1 tablet (5 mg total) by mouth 2 (two) times daily. 01/25/22  Yes Agbor-Etang, Aaron Edelman, MD  atorvastatin (LIPITOR) 20 MG tablet TAKE ONE TABLET BY MOUTH DAILY Patient taking differently: Take 20 mg by mouth at bedtime. 12/28/21  Yes Einar Pheasant, MD  cyanocobalamin 1000 MCG tablet Take 1,000 mcg by mouth in the morning.   Yes [provider]  ipratropium (ATROVENT) 0.06 % nasal spray Place 2 sprays into both nostrils 4 (four) times daily. 06/14/22  Yes Margarette Canada, NP  metroNIDAZOLE (METROGEL) 0.75 % gel Apply 1 application  topically at bedtime. to face 04/27/19  Yes [provider]  Multiple Vitamin (MULTIVITAMIN WITH MINERALS) TABS tablet Take 1 tablet by mouth in the morning.   Yes [provider]  Omega-3 Fatty Acids (FISH OIL) 1200 MG CAPS Take 1,200 mg by mouth in the morning.   Yes [provider]  pimecrolimus (ELIDEL) 1 % cream Apply 1 application  topically daily as needed (rosacea). 04/26/19  Yes [provider]  Probiotic Product (PROBIOTIC PO) Take 1 capsule by mouth in the morning.   Yes [provider]  Psyllium (METAMUCIL PO) Take 6 g by mouth in the morning. Metamucil Fiber   Yes [provider]  pantoprazole (PROTONIX) 40 MG tablet Take 1 tablet (40 mg total) by mouth daily. 03/26/22 06/10/22  Shirley Friar, PA-C    Family History Family History  Problem Relation Age of Onset   Prostate cancer Father    Heart disease Father        s/p CABG   Stroke Mother    Colon cancer Neg Hx     Social History Social History   Tobacco Use   Smoking status: Former    Packs/day: 1.00    Years: 30.00    Total pack years: 30.00    Types: Cigarettes    Quit date: 04/12/1998    Years since quitting: 24.1     Passive exposure: Past   Smokeless tobacco: Never  Vaping Use   Vaping Use: Never used  Substance Use Topics   Alcohol use: Not Currently    Alcohol/week: 1.0 standard drink of alcohol    Types: 1 Cans of beer per week    Comment: OCC   Drug use: Never     Allergies   Crestor [rosuvastatin] and Hydrocodone-chlorpheniramine   Review of Systems Review of Systems  Constitutional:  Negative for fever.  HENT:  Positive for congestion, nosebleeds, rhinorrhea and sinus pressure. Negative for ear pain and sore throat.   Respiratory:  Negative for cough.      Physical Exam Triage Vital Signs ED Triage Vitals [06/14/22 0818]  Enc Vitals Group     BP (!) 152/77     Pulse Rate (!) 49     Resp 16     Temp 97.6 F (36.4 C)     Temp Source Oral     SpO2 99 %     Weight      Height      Head Circumference      Peak Flow  Pain Score      Pain Loc      Pain Edu?      Excl. in [place]?    No data found.  Updated Vital Signs BP (!) 152/77 (BP Location: Left Arm)   Pulse (!) 49   Temp 97.6 F (36.4 C) (Oral)   Resp 16   SpO2 99%   Visual Acuity Right Eye Distance:   Left Eye Distance:   Bilateral Distance:    Right Eye Near:   Left Eye Near:    Bilateral Near:     Physical Exam Vitals and nursing note reviewed.  Constitutional:      Appearance: Normal appearance. He is not ill-appearing.  HENT:     Head: Normocephalic and atraumatic.     Right Ear: Tympanic membrane, ear canal and external ear normal. There is no impacted cerumen.     Left Ear: Tympanic membrane, ear canal and external ear normal. There is no impacted cerumen.     Nose: Congestion present. No rhinorrhea.     Comments: Nasal mucosa is mildly edematous but free of erythema.  No appreciable discharge in either nare and no blood noted on exam.  Sinuses are nontender to compression in both the frontal and maxillary region.    Mouth/Throat:     Mouth: Mucous membranes are moist.     Pharynx: Oropharynx  is clear. No oropharyngeal exudate or posterior oropharyngeal erythema.  Cardiovascular:     Rate and Rhythm: Normal rate and regular rhythm.     Pulses: Normal pulses.     Heart sounds: Normal heart sounds. No murmur heard.    No friction rub. No gallop.  Pulmonary:     Effort: Pulmonary effort is normal.     Breath sounds: Normal breath sounds. No wheezing, rhonchi or rales.  Musculoskeletal:     Cervical back: Normal range of motion and neck supple.  Skin:    General: Skin is warm and dry.     Capillary Refill: Capillary refill takes less than 2 seconds.     Findings: No erythema or rash.  Neurological:     General: No focal deficit present.     Mental Status: He is alert and oriented to person, place, and time.      UC Treatments / Results  Labs (all labs ordered are listed, but only abnormal results are displayed) Labs Reviewed  SARS CORONAVIRUS 2 BY RT PCR    EKG   Radiology No results found.  Procedures Procedures (including critical care time)  Medications Ordered in UC Medications - No data to display  Initial Impression / Assessment and Plan / UC Course  I have reviewed the triage vital signs and the nursing notes.  Pertinent labs & imaging results that were available during my care of the patient were reviewed by me and considered in my medical decision making (see chart for details).   Patient is a pleasant, nontoxic-appearing 77 year old male presenting for evaluation of 2 days worth of sinus pressure and nasal congestion with bloody discharge from the right nare is noted in the HPI above.  His physical exam is largely benign aside from some inflamed nasal mucosa but there is no appreciable discharge or residual blood noted in either nare.  He does not have tenderness to his sinuses with compression and has been afebrile.  His vital signs here show mildly elevated BP of 152/77 and a heart rate of 49.  He does not have any active bleeding.  Given  his URI  symptoms I will order a COVID PCR.  I do not suspect influenza as the patient has been afebrile.  I also do not suspect allergies as there is no paleness or bluish hue to the nasal mucosa.   COVID PCR is negative.  I will discharge patient home with a diagnosis of viral URI and treated with Atrovent nasal spray and over-the-counter antihistamines to help with sinus pressure.  Also sinus irrigation can be helpful.  Return precautions reviewed.  Final Clinical Impressions(s) / UC Diagnoses   Final diagnoses:  Viral upper respiratory tract infection     Discharge Instructions      Your COVID test today was negative.  I do feel that you have a viral upper respiratory infection.  Take over-the-counter Claritin, Zyrtec, or Allegra to help with the congestion.  You may also perform sinus irrigation using a [employer] sinus rinse kit and distilled water to help alleviate mucus burden.  You may also use the Atrovent nasal spray as prescribed.  This will decrease nasal congestion and drainage.  You can instill 2 squirts in each nostril every 6 hours as needed.  If you develop any new or worsening symptoms please return for reevaluation or see your primary care provider.     ED Prescriptions     Medication Sig Dispense Auth. Provider   ipratropium (ATROVENT) 0.06 % nasal spray Place 2 sprays into both nostrils 4 (four) times daily. 15 mL Margarette Canada, NP      PDMP not reviewed this encounter.   Margarette Canada, NP 06/14/22 [phone]

## 2022-06-14 NOTE — ED Triage Notes (Signed)
Pt reports sinus pressure and nasal congestion x 2 days.  Pt reports a bloody nose yesterday. He is taking Mucinex with no relief.

## 2022-06-17 ENCOUNTER — Ambulatory Visit (INDEPENDENT_AMBULATORY_CARE_PROVIDER_SITE_OTHER): Payer: Medicare Other | Admitting: Internal Medicine

## 2022-06-17 ENCOUNTER — Encounter: Payer: Self-pay | Admitting: Internal Medicine

## 2022-06-17 VITALS — BP 118/70 | HR 68 | Temp 98.4°F | Ht 71.0 in | Wt 177.2 lb

## 2022-06-17 DIAGNOSIS — R0981 Nasal congestion: Secondary | ICD-10-CM

## 2022-06-17 MED ORDER — CEFDINIR 300 MG PO CAPS: 300.0000 mg | ORAL_CAPSULE | Freq: Two times a day (BID) | ORAL | 0 refills | Status: DC

## 2022-06-17 NOTE — Telephone Encounter (Signed)
scheduled

## 2022-06-17 NOTE — Telephone Encounter (Signed)
Do you want to add him on at 4 for an acute?

## 2022-06-17 NOTE — Progress Notes (Signed)
Subjective:    Patient ID: Nathan Watts, male    DOB: 11/22/1945, 77 y.o.   MRN: [id number]  Patient here for  Chief Complaint  Patient presents with   Acute Visit    Sinus pain and headaches/ sore throat X 1 week    HPI Reports symptoms started 5-6 days ago - head congestion.  Increased nasal congestion - bloody mucus.  Sneezing.  Headache.  Minimal sore throat.  No increased drainage.  Some cough.  No fever.  No vomiting or diarrhea.  Has taken mucinex.  Using saline nasal spray.  No chest pain or sob.     Past Medical History:  Diagnosis Date   BPH (benign prostatic hyperplasia)    Fatty infiltration of liver    GERD (gastroesophageal reflux disease)    Hypercholesterolemia    Hyperglycemia    Hypertension    Thrombocytopenia (HCC)    Thyroid nodule    Past Surgical History:  Procedure Laterality Date   ATRIAL FIBRILLATION ABLATION N/A 03/26/2022   Procedure: ATRIAL FIBRILLATION ABLATION;  Surgeon: Vickie Epley, MD;  Location: [place] [facility];  Service: Cardiovascular;  Laterality: N/A;   CATARACT EXTRACTION W/PHACO Left 04/20/2022   Procedure: CATARACT EXTRACTION PHACO AND INTRAOCULAR LENS PLACEMENT (IOC) LEFT 13.73 01:12.4;  Surgeon: Birder Robson, MD;  Location: [place];  Service: Ophthalmology;  Laterality: Left;   CATARACT EXTRACTION W/PHACO Right 05/04/2022   Procedure: CATARACT EXTRACTION PHACO AND INTRAOCULAR LENS PLACEMENT ([place]) RIGHT;  Surgeon: Birder Robson, MD;  Location: [place];  Service: Ophthalmology;  Laterality: Right;  9.42 0:54.4   COLONOSCOPY WITH PROPOFOL N/A 10/02/2014   Procedure: COLONOSCOPY WITH PROPOFOL;  Surgeon: Manya Silvas, MD;  Location: [facility] ENDOSCOPY;  Service: Endoscopy;  Laterality: N/A;   COLONOSCOPY WITH PROPOFOL N/A 06/15/2021   Procedure: COLONOSCOPY WITH PROPOFOL;  Surgeon: Lesly Rubenstein, MD;  Location: [facility] ENDOSCOPY;  Service: Endoscopy;  Laterality: N/A;    ESOPHAGOGASTRODUODENOSCOPY (EGD) WITH PROPOFOL N/A 06/15/2021   Procedure: ESOPHAGOGASTRODUODENOSCOPY (EGD) WITH PROPOFOL;  Surgeon: Lesly Rubenstein, MD;  Location: [facility] ENDOSCOPY;  Service: Endoscopy;  Laterality: N/A;   HOLEP-LASER ENUCLEATION OF THE PROSTATE WITH MORCELLATION N/A 04/24/2021   Procedure: HOLEP-LASER ENUCLEATION OF THE PROSTATE WITH MORCELLATION;  Surgeon: Billey Co, MD;  Location: [facility] ORS;  Service: Urology;  Laterality: N/A;   INGUINAL HERNIA REPAIR     TONSILLECTOMY     TRANSURETHRAL RESECTION OF BLADDER TUMOR WITH MITOMYCIN-C N/A 04/24/2021   Procedure: 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;  Surgeon: Billey Co, MD;  Location: [facility] ORS;  Service: Urology;  Laterality: N/A;   Family History  Problem Relation Age of Onset   Prostate cancer Father    Heart disease Father        s/p CABG   Stroke Mother    Colon cancer Neg Hx    Social History   Socioeconomic History   Marital status: Married    Spouse name: Not on file   Number of children: 4   Years of education: Not on file   Highest education level: Not on file  Occupational History   Occupation: retired  Tobacco Use   Smoking status: Former    Packs/day: 1.00    Years: 30.00    Additional pack years: 0.00    Total pack years: 30.00    Types: Cigarettes    Quit date: 04/12/1998    Years since quitting: 24.2    Passive exposure: Past  Smokeless tobacco: Never  Vaping Use   Vaping Use: Never used  Substance and Sexual Activity   Alcohol use: Not Currently    Alcohol/week: 1.0 standard drink of alcohol    Types: 1 Cans of beer per week    Comment: OCC   Drug use: Never   Sexual activity: Never  Other Topics Concern   Not on file  Social History Narrative   He is married. Has four children   Social Determinants of Health   Financial Resource Strain: Low Risk  (02/04/2020)   Overall Financial Resource Strain (CARDIA)    Difficulty of Paying Living Expenses: Not  hard at all  Food Insecurity: No Food Insecurity (09/18/2021)   Hunger Vital Sign    Worried About Running Out of Food in the Last Year: Never true    Ran Out of Food in the Last Year: Never true  Transportation Needs: No Transportation Needs (09/18/2021)   PRAPARE - Hydrologist (Medical): No    Lack of Transportation (Non-Medical): No  Physical Activity: Sufficiently Active (09/18/2021)   Exercise Vital Sign    Days of Exercise per Week: 7 days    Minutes of Exercise per Session: 120 min  Stress: No Stress Concern Present (09/18/2021)   [name]    Feeling of Stress : Not at all  Social Connections: Not on file     Review of Systems  Constitutional:  Negative for appetite change and fever.  HENT:  Positive for congestion.        Minimal sore throat.   Respiratory:  Positive for cough. Negative for chest tightness and shortness of breath.   Cardiovascular:  Negative for chest pain and palpitations.  Gastrointestinal:  Negative for abdominal pain, diarrhea, nausea and vomiting.  Genitourinary:  Negative for difficulty urinating and dysuria.  Musculoskeletal:  Negative for joint swelling and myalgias.  Neurological:  Negative for dizziness and headaches.  Psychiatric/Behavioral:  Negative for agitation and dysphoric mood.        Objective:     BP 118/70   Pulse 68   Temp 98.4 F (36.9 C) (Oral)   Ht 5\' 11"  (1.803 m)   Wt 177 lb 3.2 oz (80.4 kg)   SpO2 99%   BMI 24.71 kg/m  Wt Readings from Last 3 Encounters:  06/23/22 180 lb (81.6 kg)  06/17/22 177 lb 3.2 oz (80.4 kg)  06/10/22 168 lb (76.2 kg)    Physical Exam Vitals reviewed.  Constitutional:      General: He is not in acute distress.    Appearance: Normal appearance. He is well-developed.  HENT:     Head: Normocephalic and atraumatic.     Right Ear: External ear normal.     Left Ear: External ear normal.  Eyes:      General: No scleral icterus.       Right eye: No discharge.        Left eye: No discharge.     Conjunctiva/sclera: Conjunctivae normal.  Cardiovascular:     Rate and Rhythm: Normal rate and regular rhythm.  Pulmonary:     Effort: Pulmonary effort is normal. No respiratory distress.     Breath sounds: Normal breath sounds.  Abdominal:     General: Bowel sounds are normal.     Palpations: Abdomen is soft.     Tenderness: There is no abdominal tenderness.  Musculoskeletal:        General:  No swelling or tenderness.     Cervical back: Neck supple. No tenderness.  Lymphadenopathy:     Cervical: No cervical adenopathy.  Skin:    Findings: No erythema or rash.  Neurological:     Mental Status: He is alert.  Psychiatric:        Mood and Affect: Mood normal.        Behavior: Behavior normal.      Outpatient Encounter Medications as of 06/17/2022  Medication Sig   apixaban (ELIQUIS) 5 MG TABS tablet Take 1 tablet (5 mg total) by mouth 2 (two) times daily.   atorvastatin (LIPITOR) 20 MG tablet TAKE ONE TABLET BY MOUTH DAILY (Patient taking differently: Take 20 mg by mouth at bedtime.)   cefdinir (OMNICEF) 300 MG capsule Take 1 capsule (300 mg total) by mouth 2 (two) times daily.   cyanocobalamin 1000 MCG tablet Take 1,000 mcg by mouth in the morning.   ipratropium (ATROVENT) 0.06 % nasal spray Place 2 sprays into both nostrils 4 (four) times daily.   metroNIDAZOLE (METROGEL) 0.75 % gel Apply 1 application  topically at bedtime. to face   Multiple Vitamin (MULTIVITAMIN WITH MINERALS) TABS tablet Take 1 tablet by mouth in the morning.   Omega-3 Fatty Acids (FISH OIL) 1200 MG CAPS Take 1,200 mg by mouth in the morning.   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   Probiotic Product (PROBIOTIC PO) Take 1 capsule by mouth in the morning.   Psyllium (METAMUCIL PO) Take 6 g by mouth in the morning. Metamucil Fiber   pantoprazole (PROTONIX) 40 MG  tablet Take 1 tablet (40 mg total) by mouth daily.   No facility-administered encounter medications on file as of 06/17/2022.     Lab Results  Component Value Date   WBC 6.2 03/05/2022   HGB 13.8 03/05/2022   HCT 41.2 03/05/2022   PLT 164 03/05/2022   GLUCOSE 97 03/10/2022   CHOL 140 03/10/2022   TRIG 70.0 03/10/2022   HDL 50.90 03/10/2022   LDLCALC 75 03/10/2022   ALT 15 03/10/2022   AST 18 03/10/2022   NA 139 03/10/2022   K 4.6 03/10/2022   CL 102 03/10/2022   CREATININE 1.01 03/10/2022   BUN 17 03/10/2022   CO2 31 03/10/2022   TSH 1.09 12/11/2021   PSA 0.22 03/10/2022   HGBA1C 5.9 03/10/2022    No results found.     Assessment & Plan:  Nasal congestion Assessment & Plan: Nasal congestion, headache, minimal sore throat and cough.  Discussed probable sinus infection/URI.  Will obtain nasal swab for flu, RSV and covid.  Saline nasal spray.  Omnicef as directed.  Robitussin DM.  Follow.  Call with update.    Orders: -     COVID-19, Flu A+B and RSV  Other orders -     Cefdinir; Take 1 capsule (300 mg total) by mouth 2 (two) times daily.  Dispense: 20 capsule; Refill: 0     Einar Pheasant, MD

## 2022-06-17 NOTE — Telephone Encounter (Signed)
Pt call Nathan Watts back. Call was transferred.

## 2022-06-17 NOTE — Patient Instructions (Signed)
Continue saline nasal spray - flush nose a couple of times per day.   Robitussin as we discussed.    Delsym if needed for cough  Take the antibiotic Carole Civil) - twice a day.

## 2022-06-17 NOTE — Telephone Encounter (Signed)
LMTCB. If patient does not feel like appt is necessary, let me know and can ask about some things do to over the counter

## 2022-06-17 NOTE — Telephone Encounter (Signed)
Ok to add

## 2022-06-19 LAB — COVID-19, FLU A+B AND RSV
Influenza A, NAA: NOT DETECTED
Influenza B, NAA: NOT DETECTED
RSV, NAA: NOT DETECTED
SARS-CoV-2, NAA: NOT DETECTED

## 2022-06-22 DIAGNOSIS — K080 Exfoliation of teeth due to systemic causes: Secondary | ICD-10-CM | POA: Diagnosis not present

## 2022-06-22 NOTE — Progress Notes (Unsigned)
  Electrophysiology Office Follow up Visit Note:    Date:  06/23/2022   ID:  Nathan Watts, DOB [birth year], MRN [id number]  PCP:  Einar Pheasant, MD  [facility] Cardiologist:  None  [facility] Electrophysiologist:  Vickie Epley, MD    Interval History:    Nathan Watts is a 77 y.o. male who presents for a follow up visit.   He had an atrial fibrillation and flutter ablation on March 26, 2022.  At his follow-up with Audry Pili on April 23, 2022 the patient reported a significant improvement in his palpitations and atrial fibrillation burden.  He is on Eliquis twice daily for stroke prophylaxis.  Today he tells me that he has been doing well without recurrent episodes of atrial fibrillation.  For the first couple weeks after the ablation he felt palpitations but these have since resolved.      Past medical, surgical, social and family history were reviewed.  ROS:   Please see the history of present illness.    All other systems reviewed and are negative.  EKGs/Labs/Other Studies Reviewed:    The following studies were reviewed today:    EKG:  The ekg ordered today demonstrates sinus bradycardia with first-degree AV delay.  PACs.   Physical Exam:    VS:  BP (!) 142/80   Pulse (!) 56   Ht 5\' 11"  (1.803 m)   Wt 180 lb (81.6 kg)   SpO2 98%   BMI 25.10 kg/m     Wt Readings from Last 3 Encounters:  06/23/22 180 lb (81.6 kg)  06/17/22 177 lb 3.2 oz (80.4 kg)  06/10/22 168 lb (76.2 kg)     GEN:  Well nourished, well developed in no acute distress CARDIAC: RRR, no murmurs, rubs, gallops RESPIRATORY:  Clear to auscultation without rales, wheezing or rhonchi       ASSESSMENT:    1. Paroxysmal atrial fibrillation (HCC)   2. Paroxysmal atrial flutter ([place])   3. Primary hypertension    PLAN:    In order of problems listed above:   #Paroxysmal atrial fibrillation flutter Doing well after his ablation March 26, 2022.  Maintaining sinus  rhythm.  On Eliquis for stroke prophylaxis.   #Hypertension At goal today.  Recommend checking blood pressures 1-2 times per week at home and recording the values.  Recommend bringing these recordings to the primary care physician.   Follow-up 1 year or sooner as needed.  APP appointment okay.   Signed, Lars Mage, MD, [facility], [facility] 06/23/2022 11:50 AM    Electrophysiology [facility]

## 2022-06-23 ENCOUNTER — Encounter: Payer: Self-pay | Admitting: Cardiology

## 2022-06-23 ENCOUNTER — Ambulatory Visit: Payer: Medicare Other | Attending: Cardiology | Admitting: Cardiology

## 2022-06-23 VITALS — BP 142/80 | HR 56 | Ht 71.0 in | Wt 180.0 lb

## 2022-06-23 DIAGNOSIS — I480 Paroxysmal atrial fibrillation: Secondary | ICD-10-CM

## 2022-06-23 DIAGNOSIS — I4892 Unspecified atrial flutter: Secondary | ICD-10-CM

## 2022-06-23 DIAGNOSIS — I10 Essential (primary) hypertension: Secondary | ICD-10-CM | POA: Diagnosis not present

## 2022-06-23 NOTE — Patient Instructions (Signed)
Medication Instructions:  Your physician recommends that you continue on your current medications as directed. Please refer to the Current Medication list given to you today.  *If you need a refill on your cardiac medications before your next appointment, please call your pharmacy*  Follow-Up: At [facility], you and your health needs are our priority.  As part of our continuing mission to provide you with exceptional heart care, we have created designated Provider Care Teams.  These Care Teams include your primary Cardiologist (physician) and Advanced Practice Providers (APPs -  Physician Assistants and Nurse Practitioners) who all work together to provide you with the care you need, when you need it.  Your next appointment:   1 year(s)  Provider:   You will see one of the following Advanced Practice Providers on your designated Care Team:   Renee Ursuy, PA-C Michael "Andy" Tillery, PA-C Suzann Riddle, NP  

## 2022-06-25 ENCOUNTER — Encounter: Payer: Self-pay | Admitting: Internal Medicine

## 2022-06-25 NOTE — Assessment & Plan Note (Signed)
Nasal congestion, headache, minimal sore throat and cough.  Discussed probable sinus infection/URI.  Will obtain nasal swab for flu, RSV and covid.  Saline nasal spray.  Omnicef as directed.  Robitussin DM.  Follow.  Call with update.

## 2022-07-02 DIAGNOSIS — D2261 Melanocytic nevi of right upper limb, including shoulder: Secondary | ICD-10-CM | POA: Diagnosis not present

## 2022-07-02 DIAGNOSIS — D2262 Melanocytic nevi of left upper limb, including shoulder: Secondary | ICD-10-CM | POA: Diagnosis not present

## 2022-07-02 DIAGNOSIS — D2272 Melanocytic nevi of left lower limb, including hip: Secondary | ICD-10-CM | POA: Diagnosis not present

## 2022-07-02 DIAGNOSIS — D225 Melanocytic nevi of trunk: Secondary | ICD-10-CM | POA: Diagnosis not present

## 2022-07-02 DIAGNOSIS — D485 Neoplasm of uncertain behavior of skin: Secondary | ICD-10-CM | POA: Diagnosis not present

## 2022-07-02 DIAGNOSIS — C4442 Squamous cell carcinoma of skin of scalp and neck: Secondary | ICD-10-CM | POA: Diagnosis not present

## 2022-07-06 DIAGNOSIS — K080 Exfoliation of teeth due to systemic causes: Secondary | ICD-10-CM | POA: Diagnosis not present

## 2022-07-09 ENCOUNTER — Ambulatory Visit: Payer: Medicare Other | Admitting: Internal Medicine

## 2022-07-13 ENCOUNTER — Encounter: Payer: Self-pay | Admitting: Internal Medicine

## 2022-07-13 ENCOUNTER — Ambulatory Visit (INDEPENDENT_AMBULATORY_CARE_PROVIDER_SITE_OTHER): Payer: Medicare Other | Admitting: Internal Medicine

## 2022-07-13 VITALS — BP 120/70 | HR 60 | Temp 98.3°F | Resp 16 | Ht 71.0 in | Wt 177.0 lb

## 2022-07-13 DIAGNOSIS — I700 Atherosclerosis of aorta: Secondary | ICD-10-CM | POA: Diagnosis not present

## 2022-07-13 DIAGNOSIS — R739 Hyperglycemia, unspecified: Secondary | ICD-10-CM

## 2022-07-13 DIAGNOSIS — I2510 Atherosclerotic heart disease of native coronary artery without angina pectoris: Secondary | ICD-10-CM

## 2022-07-13 DIAGNOSIS — I723 Aneurysm of iliac artery: Secondary | ICD-10-CM

## 2022-07-13 DIAGNOSIS — E7800 Pure hypercholesterolemia, unspecified: Secondary | ICD-10-CM | POA: Diagnosis not present

## 2022-07-13 DIAGNOSIS — E041 Nontoxic single thyroid nodule: Secondary | ICD-10-CM

## 2022-07-13 DIAGNOSIS — I10 Essential (primary) hypertension: Secondary | ICD-10-CM

## 2022-07-13 DIAGNOSIS — Z8551 Personal history of malignant neoplasm of bladder: Secondary | ICD-10-CM

## 2022-07-13 DIAGNOSIS — R030 Elevated blood-pressure reading, without diagnosis of hypertension: Secondary | ICD-10-CM

## 2022-07-13 LAB — HEPATIC FUNCTION PANEL
ALT: 13 U/L (ref 0–53)
AST: 19 U/L (ref 0–37)
Albumin: 4 g/dL (ref 3.5–5.2)
Alkaline Phosphatase: 62 U/L (ref 39–117)
Bilirubin, Direct: 0.2 mg/dL (ref 0.0–0.3)
Total Bilirubin: 0.8 mg/dL (ref 0.2–1.2)
Total Protein: 6.7 g/dL (ref 6.0–8.3)

## 2022-07-13 LAB — BASIC METABOLIC PANEL
BUN: 15 mg/dL (ref 6–23)
CO2: 29 mEq/L (ref 19–32)
Calcium: 9 mg/dL (ref 8.4–10.5)
Chloride: 105 mEq/L (ref 96–112)
Creatinine, Ser: 1.03 mg/dL (ref 0.40–1.50)
GFR: 69.9 mL/min (ref >60.00)
Glucose, Bld: 89 mg/dL (ref 70–99)
Potassium: 4.3 mEq/L (ref 3.5–5.1)
Sodium: 138 mEq/L (ref 135–145)

## 2022-07-13 LAB — LIPID PANEL
Cholesterol: 127 mg/dL (ref 0–200)
HDL: 43.6 mg/dL (ref >39.00)
LDL Cholesterol: 72 mg/dL (ref 0–99)
NonHDL: 83.09
Total CHOL/HDL Ratio: 3
Triglycerides: 57 mg/dL (ref 0.0–149.0)
VLDL: 11.4 mg/dL (ref 0.0–40.0)

## 2022-07-13 LAB — TSH: TSH: 1.11 u[IU]/mL (ref 0.35–5.50)

## 2022-07-13 LAB — HEMOGLOBIN A1C: Hgb A1c MFr Bld: 5.7 % (ref 4.6–6.5)

## 2022-07-13 MED ORDER — ATORVASTATIN CALCIUM 20 MG PO TABS: 20.0000 mg | ORAL_TABLET | Freq: Every day | ORAL | 3 refills | Status: DC

## 2022-07-13 NOTE — Progress Notes (Signed)
Subjective:    Patient ID: Nathan Watts, male    DOB: [birth year], 77 y.o.   MRN: [id number]  Patient here for scheduled follow up.   HPI Here for follow up regarding hypercholesterolemia, afib and hypertension.  Is s/p afib and flutter ablation on 03/26/22.  Had f/u with Dr Lalla Brothers 06/23/22.  Doing well after ablation.  Continues on eliquis.  Reports occasionally will notice harder heartbeat.  Overall he feels he is doing well.  Recommended f/u in one year. Is followed by Dr Wyn Quaker- common iliac aneurysm.   Last evaluated 09/16/2020. Slight growth (2.9cm) in maximal diameter (aorta). Right common iliac artery 1.7cm. Left 1.8cm. Recommended f/u every other year. Seeing Dr Richardo Hanks for f/u history of bladder cancer.  F/u cystoscopy 06/10/22 - no evidence of recurrence.  F/u cystoscopy in 6 months. Has been checking his blood pressure.  Reviewed outside checks - most ranging 110-135/70s.     Past Medical History:  Diagnosis Date   BPH (benign prostatic hyperplasia)    Fatty infiltration of liver    GERD (gastroesophageal reflux disease)    Hypercholesterolemia    Hyperglycemia    Hypertension    Thrombocytopenia    Thyroid nodule    Past Surgical History:  Procedure Laterality Date   ATRIAL FIBRILLATION ABLATION N/A 03/26/2022   Procedure: ATRIAL FIBRILLATION ABLATION;  Surgeon: Lanier Prude, MD;  Location: [facility];  Service: Cardiovascular;  Laterality: N/A;   CATARACT EXTRACTION W/PHACO Left 04/20/2022   Procedure: CATARACT EXTRACTION PHACO AND INTRAOCULAR LENS PLACEMENT (IOC) LEFT 13.73 01:12.4;  Surgeon: Galen Manila, MD;  Location: [facility];  Service: Ophthalmology;  Laterality: Left;   CATARACT EXTRACTION W/PHACO Right 05/04/2022   Procedure: CATARACT EXTRACTION PHACO AND INTRAOCULAR LENS PLACEMENT (IOC) RIGHT;  Surgeon: Galen Manila, MD;  Location: [facility];  Service: Ophthalmology;  Laterality: Right;  9.42 0:54.4   COLONOSCOPY WITH  PROPOFOL N/A 10/02/2014   Procedure: COLONOSCOPY WITH PROPOFOL;  Surgeon: Scot Jun, MD;  Location: [facility] ENDOSCOPY;  Service: Endoscopy;  Laterality: N/A;   COLONOSCOPY WITH PROPOFOL N/A 06/15/2021   Procedure: COLONOSCOPY WITH PROPOFOL;  Surgeon: Regis Bill, MD;  Location: [facility] ENDOSCOPY;  Service: Endoscopy;  Laterality: N/A;   ESOPHAGOGASTRODUODENOSCOPY (EGD) WITH PROPOFOL N/A 06/15/2021   Procedure: ESOPHAGOGASTRODUODENOSCOPY (EGD) WITH PROPOFOL;  Surgeon: Regis Bill, MD;  Location: [facility] ENDOSCOPY;  Service: Endoscopy;  Laterality: N/A;   HOLEP-LASER ENUCLEATION OF THE PROSTATE WITH MORCELLATION N/A 04/24/2021   Procedure: HOLEP-LASER ENUCLEATION OF THE PROSTATE WITH MORCELLATION;  Surgeon: Sondra Come, MD;  Location: [facility] ORS;  Service: Urology;  Laterality: N/A;   INGUINAL HERNIA REPAIR     TONSILLECTOMY     TRANSURETHRAL RESECTION OF BLADDER TUMOR WITH MITOMYCIN-C N/A 04/24/2021   Procedure: 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;  Surgeon: Sondra Come, MD;  Location: [facility] ORS;  Service: Urology;  Laterality: N/A;   Family History  Problem Relation Age of Onset   Prostate cancer Father    Heart disease Father        s/p CABG   Stroke Mother    Colon cancer Neg Hx    Social History   Socioeconomic History   Marital status: Married    Spouse name: Not on file   Number of children: 4   Years of education: Not on file   Highest education level: Bachelor's degree (e.g., BA, AB, BS)  Occupational History   Occupation: retired  Tobacco Use  Smoking status: Former    Packs/day: 1.00    Years: 30.00    Additional pack years: 0.00    Total pack years: 30.00    Types: Cigarettes    Quit date: 04/12/1998    Years since quitting: 24.2    Passive exposure: Past   Smokeless tobacco: Never  Vaping Use   Vaping Use: Never used  Substance and Sexual Activity   Alcohol use: Not Currently    Alcohol/week: 1.0 standard drink of alcohol     Types: 1 Cans of beer per week    Comment: OCC   Drug use: Never   Sexual activity: Never  Other Topics Concern   Not on file  Social History Narrative   He is married. Has four children   Social Determinants of Health   Financial Resource Strain: Low Risk  (07/09/2022)   Overall Financial Resource Strain (CARDIA)    Difficulty of Paying Living Expenses: Not hard at all  Food Insecurity: No Food Insecurity (07/09/2022)   Hunger Vital Sign    Worried About Running Out of Food in the Last Year: Never true    Ran Out of Food in the Last Year: Never true  Transportation Needs: No Transportation Needs (07/09/2022)   PRAPARE - Administrator, Civil Service (Medical): No    Lack of Transportation (Non-Medical): No  Physical Activity: Sufficiently Active (07/09/2022)   Exercise Vital Sign    Days of Exercise per Week: 5 days    Minutes of Exercise per Session: 60 min  Stress: No Stress Concern Present (09/18/2021)   [employer] of Occupational Health - Occupational Stress Questionnaire    Feeling of Stress : Not at all  Social Connections: Unknown (07/09/2022)   Social Connection and Isolation Panel [NHANES]    Frequency of Communication with Friends and Family: Three times a week    Frequency of Social Gatherings with Friends and Family: Twice a week    Attends Religious Services: More than 4 times per year    Active Member of [employer] or Organizations: Yes    Attends Engineer, structural: More than 4 times per year    Marital Status: Not on file     Review of Systems  Constitutional:  Negative for appetite change and unexpected weight change.  HENT:  Negative for congestion and sinus pressure.   Respiratory:  Negative for cough, chest tightness and shortness of breath.   Cardiovascular:  Negative for chest pain and palpitations.  Gastrointestinal:  Negative for abdominal pain, diarrhea, nausea and vomiting.  Genitourinary:  Negative for difficulty urinating and  dysuria.  Musculoskeletal:  Negative for joint swelling and myalgias.  Skin:  Negative for color change and rash.  Neurological:  Negative for dizziness and headaches.  Psychiatric/Behavioral:  Negative for agitation and dysphoric mood.        Objective:     BP 120/70   Pulse 60   Temp 98.3 F (36.8 C)   Resp 16   Ht 5\' 11"  (1.803 m)   Wt 177 lb (80.3 kg)   SpO2 99%   BMI 24.69 kg/m  Wt Readings from Last 3 Encounters:  07/13/22 177 lb (80.3 kg)  06/23/22 180 lb (81.6 kg)  06/17/22 177 lb 3.2 oz (80.4 kg)    Physical Exam Constitutional:      General: He is not in acute distress.    Appearance: Normal appearance. He is well-developed.  HENT:     Head: Normocephalic and atraumatic.  Right Ear: External ear normal.     Left Ear: External ear normal.  Eyes:     General: No scleral icterus.       Right eye: No discharge.        Left eye: No discharge.  Cardiovascular:     Rate and Rhythm: Normal rate and regular rhythm.  Pulmonary:     Effort: Pulmonary effort is normal. No respiratory distress.     Breath sounds: Normal breath sounds.  Abdominal:     General: Bowel sounds are normal.     Palpations: Abdomen is soft.     Tenderness: There is no abdominal tenderness.  Musculoskeletal:        General: No swelling or tenderness.     Cervical back: Neck supple. No tenderness.  Lymphadenopathy:     Cervical: No cervical adenopathy.  Skin:    Findings: No erythema or rash.  Neurological:     Mental Status: He is alert.  Psychiatric:        Mood and Affect: Mood normal.        Behavior: Behavior normal.      Outpatient Encounter Medications as of 07/13/2022  Medication Sig   apixaban (ELIQUIS) 5 MG TABS tablet Take 1 tablet (5 mg total) by mouth 2 (two) times daily.   atorvastatin (LIPITOR) 20 MG tablet Take 1 tablet (20 mg total) by mouth daily.   cyanocobalamin 1000 MCG tablet Take 1,000 mcg by mouth in the morning.   ipratropium (ATROVENT) 0.06 % nasal  spray Place 2 sprays into both nostrils 4 (four) times daily.   metroNIDAZOLE (METROGEL) 0.75 % gel Apply 1 application  topically at bedtime. to face   Multiple Vitamin (MULTIVITAMIN WITH MINERALS) TABS tablet Take 1 tablet by mouth in the morning.   Omega-3 Fatty Acids (FISH OIL) 1200 MG CAPS Take 1,200 mg by mouth in the morning.   pantoprazole (PROTONIX) 40 MG tablet Take 1 tablet (40 mg total) by mouth daily.   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   Probiotic Product (PROBIOTIC PO) Take 1 capsule by mouth in the morning.   Psyllium (METAMUCIL PO) Take 6 g by mouth in the morning. Metamucil Fiber   [DISCONTINUED] atorvastatin (LIPITOR) 20 MG tablet TAKE ONE TABLET BY MOUTH DAILY (Patient taking differently: Take 20 mg by mouth at bedtime.)   [DISCONTINUED] cefdinir (OMNICEF) 300 MG capsule Take 1 capsule (300 mg total) by mouth 2 (two) times daily.   No facility-administered encounter medications on file as of 07/13/2022.     Lab Results  Component Value Date   WBC 6.2 03/05/2022   HGB 13.8 03/05/2022   HCT 41.2 03/05/2022   PLT 164 03/05/2022   GLUCOSE 89 07/13/2022   CHOL 127 07/13/2022   TRIG 57.0 07/13/2022   HDL 43.60 07/13/2022   LDLCALC 72 07/13/2022   ALT 13 07/13/2022   AST 19 07/13/2022   NA 138 07/13/2022   K 4.3 07/13/2022   CL 105 07/13/2022   CREATININE 1.03 07/13/2022   BUN 15 07/13/2022   CO2 29 07/13/2022   TSH 1.11 07/13/2022   PSA 0.22 03/10/2022   HGBA1C 5.7 07/13/2022    No results found.     Assessment & Plan:  Hypercholesterolemia Assessment & Plan: On lipitor.  Low cholesterol diet and exercise.  Follow lipid panel and liver function tests.    Orders: -     Hepatic function panel -  Lipid panel -     TSH  Hypertension, essential Assessment & Plan: Blood pressure is doing well on no medication.  Follow pressures.  Follow metabolic panel.    Orders: -     Basic metabolic  panel  Hyperglycemia Assessment & Plan: Low carb diet and exercise.  Follow met b and a1c.   Orders: -     Hemoglobin A1c  Aortic atherosclerosis Assessment & Plan: Continue lipitor.     Common iliac aneurysm Assessment & Plan: Sees Dr Wyn Quaker.  Last evaluated 09/16/2020.  Slight growth (2.9cm) in maximal diameter (aorta).  Right common iliac artery 1.7cm.  Left 1.8cm.  Recommended f/u every other year.     Coronary artery disease involving native coronary artery of native heart without angina pectoris Assessment & Plan: Continue risk factor modification.  Remain on aspirin - low dose.  Stable.    History of bladder cancer Assessment & Plan: cystoscopy 06/10/22 - no evidence of recurrence.  F/u cystoscopy in 6 months.    Thyroid nodule Assessment & Plan: Worked up previously by Dr Renae Fickle.  Previous biopsy negative.  Last check stable.  Recommended f/u prn.  Follow thyroid function tests.    Elevated blood pressure reading Assessment & Plan: Blood pressure running higher.  Readings as outlined.  Hold on additional medication.  Follow pressures.  Send in readings over the next couple of weeks.     Other orders -     Atorvastatin Calcium; Take 1 tablet (20 mg total) by mouth daily.  Dispense: 90 tablet; Refill: 3     Dale [place], MD

## 2022-07-17 ENCOUNTER — Encounter: Payer: Self-pay | Admitting: Internal Medicine

## 2022-07-17 DIAGNOSIS — R030 Elevated blood-pressure reading, without diagnosis of hypertension: Secondary | ICD-10-CM | POA: Insufficient documentation

## 2022-07-17 NOTE — Assessment & Plan Note (Signed)
Blood pressure running higher.  Readings as outlined.  Hold on additional medication.  Follow pressures.  Send in readings over the next couple of weeks.

## 2022-07-17 NOTE — Assessment & Plan Note (Signed)
Blood pressure is doing well on no medication.  Follow pressures.  Follow metabolic panel.  

## 2022-07-17 NOTE — Assessment & Plan Note (Signed)
Low carb diet and exercise.  Follow met b and a1c.   

## 2022-07-17 NOTE — Assessment & Plan Note (Signed)
Worked up previously by Dr Paul.  Previous biopsy negative.  Last check stable.  Recommended f/u prn.  Follow thyroid function tests.  

## 2022-07-17 NOTE — Assessment & Plan Note (Signed)
Sees Dr Dew.  Last evaluated 09/16/2020.  Slight growth (2.9cm) in maximal diameter (aorta).  Right common iliac artery 1.7cm.  Left 1.8cm.  Recommended f/u every other year.   

## 2022-07-17 NOTE — Assessment & Plan Note (Signed)
Continue lipitor  ?

## 2022-07-17 NOTE — Assessment & Plan Note (Signed)
On lipitor.  Low cholesterol diet and exercise.  Follow lipid panel and liver function tests.   

## 2022-07-17 NOTE — Assessment & Plan Note (Signed)
Continue risk factor modification.  Remain on aspirin - low dose.  Stable.  

## 2022-07-17 NOTE — Assessment & Plan Note (Addendum)
cystoscopy 06/10/22 - no evidence of recurrence.  F/u cystoscopy in 6 months.

## 2022-07-21 ENCOUNTER — Encounter: Payer: Self-pay | Admitting: Internal Medicine

## 2022-07-21 DIAGNOSIS — D234 Other benign neoplasm of skin of scalp and neck: Secondary | ICD-10-CM | POA: Diagnosis not present

## 2022-07-21 DIAGNOSIS — C4442 Squamous cell carcinoma of skin of scalp and neck: Secondary | ICD-10-CM | POA: Diagnosis not present

## 2022-07-22 NOTE — Telephone Encounter (Signed)
Please call and notify Nathan Watts that I reviewed his blood pressure readings.  Overall - most averaging 120-130/70s.  Recommend for him to continue to spot check his pressures.  Send in more readings. Will hold on medication.

## 2022-07-23 NOTE — Telephone Encounter (Signed)
Pt aware.

## 2022-08-12 DIAGNOSIS — M79671 Pain in right foot: Secondary | ICD-10-CM | POA: Diagnosis not present

## 2022-08-24 ENCOUNTER — Other Ambulatory Visit: Payer: Self-pay | Admitting: *Deleted

## 2022-08-24 DIAGNOSIS — I4892 Unspecified atrial flutter: Secondary | ICD-10-CM

## 2022-08-24 MED ORDER — APIXABAN 5 MG PO TABS: 5.0000 mg | ORAL_TABLET | Freq: Two times a day (BID) | ORAL | 5 refills | Status: DC

## 2022-08-24 NOTE — Telephone Encounter (Signed)
Eliquis 5mg  refill request received. Patient is 77 years old, weight-80.3kg, Crea-1.03 on 07/13/22, Diagnosis-Afib, and last seen by Dr. Lalla Brothers on 06/23/22. Dose is appropriate based on dosing criteria. Will send in refill to requested pharmacy.

## 2022-09-07 ENCOUNTER — Encounter: Payer: Self-pay | Admitting: Internal Medicine

## 2022-09-07 NOTE — Telephone Encounter (Signed)
I called patient to offer appt. Patient says he has had this for a week and it has not changed or gotten worse so he is just going to let Dr Lorin Picket look at it on 6/5. Patient agreed to be seen sooner if anything changes.

## 2022-09-08 ENCOUNTER — Other Ambulatory Visit (INDEPENDENT_AMBULATORY_CARE_PROVIDER_SITE_OTHER): Payer: Self-pay | Admitting: Vascular Surgery

## 2022-09-08 DIAGNOSIS — I723 Aneurysm of iliac artery: Secondary | ICD-10-CM

## 2022-09-14 ENCOUNTER — Ambulatory Visit (INDEPENDENT_AMBULATORY_CARE_PROVIDER_SITE_OTHER): Payer: Medicare Other | Admitting: Vascular Surgery

## 2022-09-14 ENCOUNTER — Other Ambulatory Visit (INDEPENDENT_AMBULATORY_CARE_PROVIDER_SITE_OTHER): Payer: Self-pay

## 2022-09-15 ENCOUNTER — Encounter: Payer: Self-pay | Admitting: Internal Medicine

## 2022-09-15 ENCOUNTER — Ambulatory Visit (INDEPENDENT_AMBULATORY_CARE_PROVIDER_SITE_OTHER): Payer: Medicare Other | Admitting: Internal Medicine

## 2022-09-15 VITALS — BP 130/70 | HR 60 | Temp 97.9°F | Resp 16 | Ht 71.0 in | Wt 180.0 lb

## 2022-09-15 DIAGNOSIS — I10 Essential (primary) hypertension: Secondary | ICD-10-CM

## 2022-09-15 DIAGNOSIS — I700 Atherosclerosis of aorta: Secondary | ICD-10-CM | POA: Diagnosis not present

## 2022-09-15 DIAGNOSIS — R21 Rash and other nonspecific skin eruption: Secondary | ICD-10-CM

## 2022-09-15 DIAGNOSIS — E7800 Pure hypercholesterolemia, unspecified: Secondary | ICD-10-CM

## 2022-09-15 DIAGNOSIS — Z8551 Personal history of malignant neoplasm of bladder: Secondary | ICD-10-CM

## 2022-09-15 DIAGNOSIS — I2510 Atherosclerotic heart disease of native coronary artery without angina pectoris: Secondary | ICD-10-CM

## 2022-09-15 DIAGNOSIS — R739 Hyperglycemia, unspecified: Secondary | ICD-10-CM

## 2022-09-15 DIAGNOSIS — I723 Aneurysm of iliac artery: Secondary | ICD-10-CM

## 2022-09-15 DIAGNOSIS — I480 Paroxysmal atrial fibrillation: Secondary | ICD-10-CM

## 2022-09-15 MED ORDER — NYSTATIN 100000 UNIT/GM EX CREA: 1.0000 | TOPICAL_CREAM | Freq: Two times a day (BID) | CUTANEOUS | 0 refills | Status: DC

## 2022-09-15 NOTE — Progress Notes (Signed)
Subjective:    Patient ID: Nathan Watts, male    DOB: 08-08-1945, 77 y.o.   MRN: [id number]  Patient here for  Chief Complaint  Patient presents with   Medical Management of Chronic Issues    HPI Here for follow up regarding hypercholesterolemia, afib and hypertension. Is s/p afib and flutter ablation on 03/26/22. Had f/u with Dr Lalla Brothers 06/23/22. Doing well after ablation. Continues on eliquis. Is followed by Dr Wyn Quaker- common iliac aneurysm.   Last evaluated 09/16/2020. Slight growth (2.9cm) in maximal diameter (aorta). Right common iliac artery 1.7cm. Left 1.8cm. Recommended f/u every other year. Has f/u scheduled in July. Seeing Dr Richardo Hanks for f/u history of bladder cancer.  F/u cystoscopy 06/10/22 - no evidence of recurrence.  F/u cystoscopy in 6 months. Has f/u scheduled.  Is walking.  No chest pain or sob.  No palpitations or increased heart rate.  Called in with rash.  Persistent - left groin, axilla and lower sacral area. Has used neosporin and hydrocortisone.    Past Medical History:  Diagnosis Date   BPH (benign prostatic hyperplasia)    Fatty infiltration of liver    GERD (gastroesophageal reflux disease)    Hypercholesterolemia    Hyperglycemia    Hypertension    Thrombocytopenia (HCC)    Thyroid nodule    Past Surgical History:  Procedure Laterality Date   ATRIAL FIBRILLATION ABLATION N/A 03/26/2022   Procedure: ATRIAL FIBRILLATION ABLATION;  Surgeon: Lanier Prude, MD;  Location: [facility];  Service: Cardiovascular;  Laterality: N/A;   CATARACT EXTRACTION W/PHACO Left 04/20/2022   Procedure: CATARACT EXTRACTION PHACO AND INTRAOCULAR LENS PLACEMENT (IOC) LEFT 13.73 01:12.4;  Surgeon: Galen Manila, MD;  Location: [facility];  Service: Ophthalmology;  Laterality: Left;   CATARACT EXTRACTION W/PHACO Right 05/04/2022   Procedure: CATARACT EXTRACTION PHACO AND INTRAOCULAR LENS PLACEMENT (IOC) RIGHT;  Surgeon: Galen Manila, MD;  Location: [facility]ERY [facility];  Service: Ophthalmology;  Laterality: Right;  9.42 0:54.4   COLONOSCOPY WITH PROPOFOL N/A 10/02/2014   Procedure: COLONOSCOPY WITH PROPOFOL;  Surgeon: Scot Jun, MD;  Location: [facility] ENDOSCOPY;  Service: Endoscopy;  Laterality: N/A;   COLONOSCOPY WITH PROPOFOL N/A 06/15/2021   Procedure: COLONOSCOPY WITH PROPOFOL;  Surgeon: Regis Bill, MD;  Location: [facility] ENDOSCOPY;  Service: Endoscopy;  Laterality: N/A;   ESOPHAGOGASTRODUODENOSCOPY (EGD) WITH PROPOFOL N/A 06/15/2021   Procedure: ESOPHAGOGASTRODUODENOSCOPY (EGD) WITH PROPOFOL;  Surgeon: Regis Bill, MD;  Location: [facility] ENDOSCOPY;  Service: Endoscopy;  Laterality: N/A;   HOLEP-LASER ENUCLEATION OF THE PROSTATE WITH MORCELLATION N/A 04/24/2021   Procedure: HOLEP-LASER ENUCLEATION OF THE PROSTATE WITH MORCELLATION;  Surgeon: Sondra Come, MD;  Location: [facility] ORS;  Service: Urology;  Laterality: N/A;   INGUINAL HERNIA REPAIR     TONSILLECTOMY     TRANSURETHRAL RESECTION OF BLADDER TUMOR WITH MITOMYCIN-C N/A 04/24/2021   Procedure: 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;  Surgeon: Sondra Come, MD;  Location: [facility] ORS;  Service: Urology;  Laterality: N/A;   Family History  Problem Relation Age of Onset   Prostate cancer Father    Heart disease Father        s/p CABG   Stroke Mother    Colon cancer Neg Hx    Social History   Socioeconomic History   Marital status: Married    Spouse name: Not on file   Number of children: 4   Years of education: Not on file   Highest education level: Bachelor's  degree (e.g., BA, AB, BS)  Occupational History   Occupation: retired  Tobacco Use   Smoking status: Former    Packs/day: 1.00    Years: 30.00    Additional pack years: 0.00    Total pack years: 30.00    Types: Cigarettes    Quit date: 04/12/1998    Years since quitting: 24.4    Passive exposure: Past   Smokeless tobacco: Never  Vaping Use   Vaping Use: Never used  Substance and  Sexual Activity   Alcohol use: Not Currently    Alcohol/week: 1.0 standard drink of alcohol    Types: 1 Cans of beer per week    Comment: OCC   Drug use: Never   Sexual activity: Never  Other Topics Concern   Not on file  Social History Narrative   He is married. Has four children   Social Determinants of Health   Financial Resource Strain: Low Risk  (07/09/2022)   Overall Financial Resource Strain (CARDIA)    Difficulty of Paying Living Expenses: Not hard at all  Food Insecurity: No Food Insecurity (07/09/2022)   Hunger Vital Sign    Worried About Running Out of Food in the Last Year: Never true    Ran Out of Food in the Last Year: Never true  Transportation Needs: No Transportation Needs (07/09/2022)   PRAPARE - Administrator, Civil Service (Medical): No    Lack of Transportation (Non-Medical): No  Physical Activity: Sufficiently Active (07/09/2022)   Exercise Vital Sign    Days of Exercise per Week: 5 days    Minutes of Exercise per Session: 60 min  Stress: No Stress Concern Present (09/18/2021)   [employer] of Occupational Health - Occupational Stress Questionnaire    Feeling of Stress : Not at all  Social Connections: Unknown (07/09/2022)   Social Connection and Isolation Panel [NHANES]    Frequency of Communication with Friends and Family: Three times a week    Frequency of Social Gatherings with Friends and Family: Twice a week    Attends Religious Services: More than 4 times per year    Active Member of [employer] or Organizations: Yes    Attends Engineer, structural: More than 4 times per year    Marital Status: Not on file     Review of Systems  Constitutional:  Negative for appetite change and unexpected weight change.  HENT:  Negative for congestion and sinus pressure.   Respiratory:  Negative for cough, chest tightness and shortness of breath.   Cardiovascular:  Negative for chest pain, palpitations and leg swelling.  Gastrointestinal:   Negative for abdominal pain, diarrhea, nausea and vomiting.  Genitourinary:  Negative for difficulty urinating and dysuria.  Musculoskeletal:  Negative for joint swelling and myalgias.  Skin:  Positive for rash. Negative for wound.  Neurological:  Negative for dizziness and headaches.  Psychiatric/Behavioral:  Negative for agitation and dysphoric mood.        Objective:     BP 130/70   Pulse 60   Temp 97.9 F (36.6 C)   Resp 16   Ht 5\' 11"  (1.803 m)   Wt 180 lb (81.6 kg)   SpO2 98%   BMI 25.10 kg/m  Wt Readings from Last 3 Encounters:  09/15/22 180 lb (81.6 kg)  07/13/22 177 lb (80.3 kg)  06/23/22 180 lb (81.6 kg)    Physical Exam Vitals reviewed.  Constitutional:      General: He is not in acute  distress.    Appearance: Normal appearance. He is well-developed.  HENT:     Head: Normocephalic and atraumatic.     Right Ear: External ear normal.     Left Ear: External ear normal.  Eyes:     General: No scleral icterus.       Right eye: No discharge.        Left eye: No discharge.     Conjunctiva/sclera: Conjunctivae normal.  Cardiovascular:     Rate and Rhythm: Normal rate and regular rhythm.  Pulmonary:     Effort: Pulmonary effort is normal. No respiratory distress.     Breath sounds: Normal breath sounds.  Abdominal:     General: Bowel sounds are normal.     Palpations: Abdomen is soft.     Tenderness: There is no abdominal tenderness.  Musculoskeletal:        General: No swelling or tenderness.     Cervical back: Neck supple. No tenderness.  Lymphadenopathy:     Cervical: No cervical adenopathy.  Skin:    Comments: Rash - inguinal area and axilla.   Neurological:     Mental Status: He is alert.  Psychiatric:        Mood and Affect: Mood normal.        Behavior: Behavior normal.      Outpatient Encounter Medications as of 09/15/2022  Medication Sig   nystatin cream (MYCOSTATIN) Apply 1 Application topically 2 (two) times daily.   apixaban (ELIQUIS)  5 MG TABS tablet Take 1 tablet (5 mg total) by mouth 2 (two) times daily.   atorvastatin (LIPITOR) 20 MG tablet Take 1 tablet (20 mg total) by mouth daily.   cyanocobalamin 1000 MCG tablet Take 1,000 mcg by mouth in the morning.   ipratropium (ATROVENT) 0.06 % nasal spray Place 2 sprays into both nostrils 4 (four) times daily.   metroNIDAZOLE (METROGEL) 0.75 % gel Apply 1 application  topically at bedtime. to face   Multiple Vitamin (MULTIVITAMIN WITH MINERALS) TABS tablet Take 1 tablet by mouth in the morning.   Omega-3 Fatty Acids (FISH OIL) 1200 MG CAPS Take 1,200 mg by mouth in the morning.   pantoprazole (PROTONIX) 40 MG tablet Take 1 tablet (40 mg total) by mouth daily.   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   Probiotic Product (PROBIOTIC PO) Take 1 capsule by mouth in the morning.   Psyllium (METAMUCIL PO) Take 6 g by mouth in the morning. Metamucil Fiber   No facility-administered encounter medications on file as of 09/15/2022.     Lab Results  Component Value Date   WBC 6.2 03/05/2022   HGB 13.8 03/05/2022   HCT 41.2 03/05/2022   PLT 164 03/05/2022   GLUCOSE 89 07/13/2022   CHOL 127 07/13/2022   TRIG 57.0 07/13/2022   HDL 43.60 07/13/2022   LDLCALC 72 07/13/2022   ALT 13 07/13/2022   AST 19 07/13/2022   NA 138 07/13/2022   K 4.3 07/13/2022   CL 105 07/13/2022   CREATININE 1.03 07/13/2022   BUN 15 07/13/2022   CO2 29 07/13/2022   TSH 1.11 07/13/2022   PSA 0.22 03/10/2022   HGBA1C 5.7 07/13/2022    No results found.     Assessment & Plan:  Hypercholesterolemia Assessment & Plan: On lipitor.  Low cholesterol diet and exercise.  Follow lipid panel and liver function tests.    Orders: -     Lipid panel; Future -  Hepatic function panel; Future  Hypertension, essential Assessment & Plan: Blood pressure is doing well on no medication.  Follow pressures.  Follow metabolic panel.    Orders: -     Basic  metabolic panel; Future -     CBC with Differential/Platelet; Future  Hyperglycemia Assessment & Plan: Low carb diet and exercise.  Follow met b and a1c.   Orders: -     Hemoglobin A1c; Future  Aortic atherosclerosis (HCC) Assessment & Plan: Continue lipitor.     Common iliac aneurysm [facility]) Assessment & Plan: Sees Dr Wyn Quaker.  Last evaluated 09/16/2020.  Slight growth (2.9cm) in maximal diameter (aorta).  Right common iliac artery 1.7cm.  Left 1.8cm.  Recommended f/u every other year.  Has f/u scheduled in July.    Coronary artery disease involving native coronary artery of native heart without angina pectoris Assessment & Plan: Continue risk factor modification.  Remain on aspirin - low dose.  Stable.    History of bladder cancer Assessment & Plan: cystoscopy 06/10/22 - no evidence of recurrence.  F/u cystoscopy in 6 months.    Paroxysmal atrial fibrillation [facility]) Assessment & Plan: Is s/p afib and flutter ablation on 03/26/22. Had f/u with Dr Lalla Brothers 06/23/22. Doing well after ablation. Continues on eliquis.   Rash Assessment & Plan: Appears to be c/w yeast infection.  Nystatin cream as directed.  Keep area dry.  Follow.    Other orders -     Nystatin; Apply 1 Application topically 2 (two) times daily.  Dispense: 60 g; Refill: 0     Dale Sarepta, MD

## 2022-09-19 ENCOUNTER — Encounter: Payer: Self-pay | Admitting: Internal Medicine

## 2022-09-19 DIAGNOSIS — R21 Rash and other nonspecific skin eruption: Secondary | ICD-10-CM | POA: Insufficient documentation

## 2022-09-19 NOTE — Assessment & Plan Note (Signed)
Continue risk factor modification.  Remain on aspirin - low dose.  Stable.  

## 2022-09-19 NOTE — Assessment & Plan Note (Signed)
Appears to be c/w yeast infection.  Nystatin cream as directed.  Keep area dry.  Follow.

## 2022-09-19 NOTE — Assessment & Plan Note (Signed)
On lipitor.  Low cholesterol diet and exercise.  Follow lipid panel and liver function tests.   

## 2022-09-19 NOTE — Assessment & Plan Note (Signed)
cystoscopy 06/10/22 - no evidence of recurrence.  F/u cystoscopy in 6 months.  

## 2022-09-19 NOTE — Assessment & Plan Note (Signed)
Sees Dr Wyn Quaker.  Last evaluated 09/16/2020.  Slight growth (2.9cm) in maximal diameter (aorta).  Right common iliac artery 1.7cm.  Left 1.8cm.  Recommended f/u every other year.  Has f/u scheduled in July.

## 2022-09-19 NOTE — Assessment & Plan Note (Signed)
Low carb diet and exercise.  Follow met b and a1c.   

## 2022-09-19 NOTE — Assessment & Plan Note (Signed)
Blood pressure is doing well on no medication.  Follow pressures.  Follow metabolic panel.  

## 2022-09-19 NOTE — Assessment & Plan Note (Signed)
Continue lipitor  ?

## 2022-09-19 NOTE — Assessment & Plan Note (Signed)
Is s/p afib and flutter ablation on 03/26/22. Had f/u with Dr Lalla Brothers 06/23/22. Doing well after ablation. Continues on eliquis.

## 2022-09-24 ENCOUNTER — Other Ambulatory Visit: Payer: Self-pay

## 2022-09-24 MED ORDER — NYSTATIN 100000 UNIT/GM EX POWD: 1.0000 | Freq: Two times a day (BID) | CUTANEOUS | 0 refills | Status: DC

## 2022-09-24 NOTE — Telephone Encounter (Signed)
Nystatin powder sent in. Pt does not need cream. He is going to call with update within the next week.

## 2022-09-24 NOTE — Telephone Encounter (Signed)
Ok for nystatin powder bid and can refill nystatin cream.  Ok to send in both.  Keep area as dry as possible.

## 2022-10-13 DIAGNOSIS — L309 Dermatitis, unspecified: Secondary | ICD-10-CM | POA: Diagnosis not present

## 2022-10-25 DIAGNOSIS — K080 Exfoliation of teeth due to systemic causes: Secondary | ICD-10-CM | POA: Diagnosis not present

## 2022-10-26 ENCOUNTER — Encounter (INDEPENDENT_AMBULATORY_CARE_PROVIDER_SITE_OTHER): Payer: Self-pay | Admitting: Vascular Surgery

## 2022-10-26 ENCOUNTER — Ambulatory Visit (INDEPENDENT_AMBULATORY_CARE_PROVIDER_SITE_OTHER): Payer: Medicare Other

## 2022-10-26 ENCOUNTER — Ambulatory Visit (INDEPENDENT_AMBULATORY_CARE_PROVIDER_SITE_OTHER): Payer: Self-pay | Admitting: Vascular Surgery

## 2022-10-26 VITALS — BP 124/76 | HR 60 | Resp 16 | Wt 180.0 lb

## 2022-10-26 DIAGNOSIS — I723 Aneurysm of iliac artery: Secondary | ICD-10-CM

## 2022-10-26 DIAGNOSIS — E7800 Pure hypercholesterolemia, unspecified: Secondary | ICD-10-CM | POA: Diagnosis not present

## 2022-10-26 DIAGNOSIS — I10 Essential (primary) hypertension: Secondary | ICD-10-CM

## 2022-10-26 NOTE — Progress Notes (Signed)
MRN : [id number]  Tag Nathan Watts is a 77 y.o. (1945-05-09) male who presents with chief complaint of  Chief Complaint  Patient presents with   Follow-up    Ultrasound follow up  .  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  The patient is doing well without any new complaints today.  He denies any aneurysm related symptoms. Specifically, the patient denies new back or abdominal pain, or signs of peripheral embolization.  His noninvasive studies today demonstrate stable sizes with right common iliac artery measuring 1.4 cm and left common iliac artery measuring 1.6 cm.  The abdominal aorta has a maximal diameter of 3 cm.  Current Outpatient Medications  Medication Sig Dispense Refill   apixaban (ELIQUIS) 5 MG TABS tablet Take 1 tablet (5 mg total) by mouth 2 (two) times daily. 60 tablet 5   atorvastatin (LIPITOR) 20 MG tablet Take 1 tablet (20 mg total) by mouth daily. 90 tablet 3   cyanocobalamin 1000 MCG tablet Take 1,000 mcg by mouth in the morning.     ipratropium (ATROVENT) 0.06 % nasal spray Place 2 sprays into both nostrils 4 (four) times daily. 15 mL 12   metroNIDAZOLE (METROGEL) 0.75 % gel Apply 1 application  topically at bedtime. to face     Multiple Vitamin (MULTIVITAMIN WITH MINERALS) TABS tablet Take 1 tablet by mouth in the morning.     nystatin (MYCOSTATIN/NYSTOP) powder Apply 1 Application topically 2 (two) times daily. 60 g 0   nystatin cream (MYCOSTATIN) Apply 1 Application topically 2 (two) times daily. 60 g 0   Omega-3 Fatty Acids (FISH OIL) 1200 MG CAPS Take 1,200 mg by mouth in the morning.     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     Probiotic Product (PROBIOTIC PO) Take 1 capsule by mouth in the morning.     Psyllium (METAMUCIL PO) Take 6 g by mouth in the morning. Metamucil Fiber      pantoprazole (PROTONIX) 40 MG tablet Take 1 tablet (40 mg total) by mouth daily. 45 tablet 0   No current facility-administered medications for this visit.    Past Medical History:  Diagnosis Date   BPH (benign prostatic hyperplasia)    Fatty infiltration of liver    GERD (gastroesophageal reflux disease)    Hypercholesterolemia    Hyperglycemia    Hypertension    Thrombocytopenia (HCC)    Thyroid nodule     Past Surgical History:  Procedure Laterality Date   ATRIAL FIBRILLATION ABLATION N/A 03/26/2022   Procedure: ATRIAL FIBRILLATION ABLATION;  Surgeon: Lanier Prude, MD;  Location: [facility];  Service: Cardiovascular;  Laterality: N/A;   CATARACT EXTRACTION W/PHACO Left 04/20/2022   Procedure: CATARACT EXTRACTION PHACO AND INTRAOCULAR LENS PLACEMENT (IOC) LEFT 13.73 01:12.4;  Surgeon: Galen Manila, MD;  Location: [facility];  Service: Ophthalmology;  Laterality: Left;   CATARACT EXTRACTION W/PHACO Right 05/04/2022   Procedure: CATARACT EXTRACTION PHACO AND INTRAOCULAR LENS PLACEMENT (IOC) RIGHT;  Surgeon: Galen Manila, MD;  Location: [facility];  Service: Ophthalmology;  Laterality: Right;  9.42 0:54.4   COLONOSCOPY WITH PROPOFOL N/A 10/02/2014   Procedure: COLONOSCOPY WITH PROPOFOL;  Surgeon: Scot Jun, MD;  Location: [facility];  Service: Endoscopy;  Laterality: N/A;   COLONOSCOPY WITH PROPOFOL N/A 06/15/2021   Procedure: COLONOSCOPY WITH PROPOFOL;  Surgeon:  Regis Bill, MD;  Location: [facility] ENDOSCOPY;  Service: Endoscopy;  Laterality: N/A;   ESOPHAGOGASTRODUODENOSCOPY (EGD) WITH PROPOFOL N/A 06/15/2021   Procedure: ESOPHAGOGASTRODUODENOSCOPY (EGD) WITH PROPOFOL;  Surgeon: Regis Bill, MD;  Location: [facility] ENDOSCOPY;  Service: Endoscopy;  Laterality: N/A;   HOLEP-LASER ENUCLEATION OF THE PROSTATE WITH MORCELLATION N/A 04/24/2021   Procedure: HOLEP-LASER ENUCLEATION OF THE PROSTATE WITH MORCELLATION;  Surgeon: Sondra Come, MD;  Location: [facility] OR[facility];  Service: Urology;  Laterality: N/A;   INGUINAL HERNIA REPAIR     TONSILLECTOMY     TRANSURETHRAL RESECTION OF BLADDER TUMOR WITH MITOMYCIN-C N/A 04/24/2021   Procedure: 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;  Surgeon: Sondra Come, MD;  Location: [facility] OR[facility];  Service: Urology;  Laterality: N/A;     Social History   Tobacco Use   Smoking status: Former    Current packs/day: 0.00    Average packs/day: 1 pack/day for 30.0 years (30.0 ttl pk-yrs)    Types: Cigarettes    Start date: 04/12/1968    Quit date: 04/12/1998    Years since quitting: 24.5    Passive exposure: Past   Smokeless tobacco: Never  Vaping Use   Vaping status: Never Used  Substance Use Topics   Alcohol use: Not Currently    Alcohol/week: 1.0 standard drink of alcohol    Types: 1 Cans of beer per week    Comment: OCC   Drug use: Never       Family History  Problem Relation Age of Onset   Prostate cancer Father    Heart disease Father        s/p CABG   Stroke Mother    Colon cancer Neg Hx      Allergies  Allergen Reactions   Crestor [Rosuvastatin] Other (See Comments)    Causes GI upset and anxiety   Hydrocodone-Chlorpheniramine Other (See Comments)    Unknown, possible causes anxiety  possible causes anxiety  Did ok with low dose     REVIEW OF SYSTEMS (Negative unless checked)  Constitutional: [] Weight loss  [] Fever  [] Chills Cardiac: [] Chest pain   [] Chest pressure   [] Palpitations   [] Shortness of breath when laying flat   [] Shortness of breath at rest   [] Shortness of breath with exertion. Vascular:  [] Pain in legs with walking   [] Pain in legs at rest   [] Pain in legs when laying flat   [] Claudication   [] Pain in feet when walking  [] Pain in feet at rest  [] Pain in feet when laying flat   [] History of DVT   [] Phlebitis   [] Swelling in legs   [] Varicose veins   [] Non-healing ulcers Pulmonary:   [] Uses home oxygen   [] Productive cough    [] Hemoptysis   [] Wheeze  [] COPD   [] Asthma Neurologic:  [] Dizziness  [] Blackouts   [] Seizures   [] History of stroke   [] History of TIA  [] Aphasia   [] Temporary blindness   [] Dysphagia   [] Weakness or numbness in arms   [] Weakness or numbness in legs Musculoskeletal:  [] Arthritis   [] Joint swelling   [] Joint pain   [] Low back pain Hematologic:  [] Easy bruising  [] Easy bleeding   [] Hypercoagulable state   [x] Anemic   Gastrointestinal:  [] Blood in stool   [] Vomiting blood  [x] Gastroesophageal reflux/heartburn   [] Abdominal pain Genitourinary:  [] Chronic kidney disease   [] Difficult urination  [] Frequent urination  [] Burning with urination   [] Hematuria Skin:  [] Rashes   [] Ulcers   [] Wounds Psychological:  [] History of  anxiety   []  History of major depression.  Physical Examination  BP 124/76 (BP Location: Left Arm)   Pulse 60   Resp 16   Wt 180 lb (81.6 kg)   BMI 25.10 kg/m  Gen:  WD/WN, NAD.  Appears younger than stated age Head: [place]/AT, No temporalis wasting. Ear/Nose/Throat: Hearing grossly intact, nares w/o erythema or drainage Eyes: Conjunctiva clear. Sclera non-icteric Neck: Supple.  Trachea midline Pulmonary:  Good air movement, no use of accessory muscles.  Cardiac: RRR, no JVD Vascular:  Vessel Right Left  Radial Palpable Palpable                          PT Palpable Palpable  DP Palpable Palpable   Gastrointestinal: soft, non-tender/non-distended. No guarding/reflex.  Musculoskeletal: M/S 5/5 throughout.  No deformity or atrophy.  No edema. Neurologic: Sensation grossly intact in extremities.  Symmetrical.  Speech is fluent.  Psychiatric: Judgment intact, Mood & affect appropriate for pt's clinical situation. Dermatologic: No rashes or ulcers noted.  No cellulitis or open wounds.      Labs No results found for this or any previous visit (from the past 2160 hour(s)).  Radiology No results found.  Assessment/Plan Hypertension, essential blood pressure control  important in reducing the progression of atherosclerotic disease and aneurysmal disease     Hypercholesterolemia lipid control important in reducing the progression of atherosclerotic disease. Continue statin therapy  Common iliac aneurysm (HCC) His noninvasive studies today demonstrate stable sizes with right common iliac artery measuring 1.4 cm and left common iliac artery measuring 1.6 cm.  The abdominal aorta has a maximal diameter of 3 cm.  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  No role for intervention at this size.  This can continue to be checked every other year.    Festus Barren, MD  10/26/2022 9:35 AM    This note was created with Dragon medical transcription system.  Any errors from dictation are purely unintentional

## 2022-10-26 NOTE — Assessment & Plan Note (Signed)
His noninvasive studies today demonstrate stable sizes with right common iliac artery measuring 1.4 cm and left common iliac artery measuring 1.6 cm.  The abdominal aorta has a maximal diameter of 3 cm.  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  No role for intervention at this size.  This can continue to be checked every other year.

## 2022-11-18 ENCOUNTER — Ambulatory Visit (INDEPENDENT_AMBULATORY_CARE_PROVIDER_SITE_OTHER): Payer: Medicare Other | Admitting: Emergency Medicine

## 2022-11-18 VITALS — Ht 71.0 in | Wt 170.0 lb

## 2022-11-18 DIAGNOSIS — Z0000 Encounter for general adult medical examination without abnormal findings: Secondary | ICD-10-CM | POA: Diagnosis not present

## 2022-11-18 NOTE — Patient Instructions (Addendum)
Nathan Watts , Thank you for taking time to come for your Medicare Wellness Visit. I appreciate your ongoing commitment to your health goals. Please review the following plan we discussed and let me know if I can assist you in the future.   Referrals/Orders/Follow-Ups/Clinician Recommendations: Recommend getting a flu shot this fall and check on getting a Covid booster.  This is a list of the screening recommended for you and due dates:  Health Maintenance  Topic Date Due   Flu Shot  11/11/2022   COVID-19 Vaccine (6 - 2023-24 season) 12/04/2022*   Medicare Annual Wellness Visit  11/18/2023   DTaP/Tdap/Td vaccine (2 - Td or Tdap) 12/08/2026   Pneumonia Vaccine  Completed   Hepatitis C Screening  Completed   Zoster (Shingles) Vaccine  Completed   HPV Vaccine  Aged Out   Colon Cancer Screening  Discontinued  *Topic was postponed. The date shown is not the original due date.    Advanced directives: Please bring a copy of your health care power of attorney and living will to the office to be added to your chart at your convenience.   Next Medicare Annual Wellness Visit scheduled for next year: Yes, 11/24/23 @ 9am  Preventive Care 65 Years and Older, Male  Preventive care refers to lifestyle choices and visits with your health care provider that can promote health and wellness. What does preventive care include? A yearly physical exam. This is also called an annual well check. Dental exams once or twice a year. Routine eye exams. Ask your health care provider how often you should have your eyes checked. Personal lifestyle choices, including: Daily care of your teeth and gums. Regular physical activity. Eating a healthy diet. Avoiding tobacco and drug use. Limiting alcohol use. Practicing safe sex. Taking low doses of aspirin every day. Taking vitamin and mineral supplements as recommended by your health care provider. What happens during an annual well check? The services and  screenings done by your health care provider during your annual well check will depend on your age, overall health, lifestyle risk factors, and family history of disease. Counseling  Your health care provider may ask you questions about your: Alcohol use. Tobacco use. Drug use. Emotional well-being. Home and relationship well-being. Sexual activity. Eating habits. History of falls. Memory and ability to understand (cognition). Work and work Astronomer. Screening  You may have the following tests or measurements: Height, weight, and BMI. Blood pressure. Lipid and cholesterol levels. These may be checked every 5 years, or more frequently if you are over 85 years old. Skin check. Lung cancer screening. You may have this screening every year starting at age 6 if you have a 30-pack-year history of smoking and currently smoke or have quit within the past 15 years. Fecal occult blood test (FOBT) of the stool. You may have this test every year starting at age 22. Flexible sigmoidoscopy or colonoscopy. You may have a sigmoidoscopy every 5 years or a colonoscopy every 10 years starting at age 27. Prostate cancer screening. Recommendations will vary depending on your family history and other risks. Hepatitis C blood test. Hepatitis B blood test. Sexually transmitted disease (STD) testing. Diabetes screening. This is done by checking your blood sugar (glucose) after you have not eaten for a while (fasting). You may have this done every 1-3 years. Abdominal aortic aneurysm (AAA) screening. You may need this if you are a current or former smoker. Osteoporosis. You may be screened starting at age 72 if you are  at high risk. Talk with your health care provider about your test results, treatment options, and if necessary, the need for more tests. Vaccines  Your health care provider may recommend certain vaccines, such as: Influenza vaccine. This is recommended every year. Tetanus, diphtheria, and  acellular pertussis (Tdap, Td) vaccine. You may need a Td booster every 10 years. Zoster vaccine. You may need this after age 18. Pneumococcal 13-valent conjugate (PCV13) vaccine. One dose is recommended after age 55. Pneumococcal polysaccharide (PPSV23) vaccine. One dose is recommended after age 25. Talk to your health care provider about which screenings and vaccines you need and how often you need them. This information is not intended to replace advice given to you by your health care provider. Make sure you discuss any questions you have with your health care provider. Document Released: 04/25/2015 Document Revised: 12/17/2015 Document Reviewed: 01/28/2015 Elsevier Interactive Patient Education  2017 [employer].  Fall Prevention in the Home Falls can cause injuries. They can happen to people of all ages. There are many things you can do to make your home safe and to help prevent falls. What can I do on the outside of my home? Regularly fix the edges of walkways and driveways and fix any cracks. Remove anything that might make you trip as you walk through a door, such as a raised step or threshold. Trim any bushes or trees on the path to your home. Use bright outdoor lighting. Clear any walking paths of anything that might make someone trip, such as rocks or tools. Regularly check to see if handrails are loose or broken. Make sure that both sides of any steps have handrails. Any raised decks and porches should have guardrails on the edges. Have any leaves, snow, or ice cleared regularly. Use sand or salt on walking paths during winter. Clean up any spills in your garage right away. This includes oil or grease spills. What can I do in the bathroom? Use night lights. Install grab bars by the toilet and in the tub and shower. Do not use towel bars as grab bars. Use non-skid mats or decals in the tub or shower. If you need to sit down in the shower, use a plastic, non-slip stool. Keep  the floor dry. Clean up any water that spills on the floor as soon as it happens. Remove soap buildup in the tub or shower regularly. Attach bath mats securely with double-sided non-slip rug tape. Do not have throw rugs and other things on the floor that can make you trip. What can I do in the bedroom? Use night lights. Make sure that you have a light by your bed that is easy to reach. Do not use any sheets or blankets that are too big for your bed. They should not hang down onto the floor. Have a firm chair that has side arms. You can use this for support while you get dressed. Do not have throw rugs and other things on the floor that can make you trip. What can I do in the kitchen? Clean up any spills right away. Avoid walking on wet floors. Keep items that you use a lot in easy-to-reach places. If you need to reach something above you, use a strong step stool that has a grab bar. Keep electrical cords out of the way. Do not use floor polish or wax that makes floors slippery. If you must use wax, use non-skid floor wax. Do not have throw rugs and other things on the floor  that can make you trip. What can I do with my stairs? Do not leave any items on the stairs. Make sure that there are handrails on both sides of the stairs and use them. Fix handrails that are broken or loose. Make sure that handrails are as long as the stairways. Check any carpeting to make sure that it is firmly attached to the stairs. Fix any carpet that is loose or worn. Avoid having throw rugs at the top or bottom of the stairs. If you do have throw rugs, attach them to the floor with carpet tape. Make sure that you have a light switch at the top of the stairs and the bottom of the stairs. If you do not have them, ask someone to add them for you. What else can I do to help prevent falls? Wear shoes that: Do not have high heels. Have rubber bottoms. Are comfortable and fit you well. Are closed at the toe. Do not  wear sandals. If you use a stepladder: Make sure that it is fully opened. Do not climb a closed stepladder. Make sure that both sides of the stepladder are locked into place. Ask someone to hold it for you, if possible. Clearly mark and make sure that you can see: Any grab bars or handrails. First and last steps. Where the edge of each step is. Use tools that help you move around (mobility aids) if they are needed. These include: Canes. Walkers. Scooters. Crutches. Turn on the lights when you go into a dark area. Replace any light bulbs as soon as they burn out. Set up your furniture so you have a clear path. Avoid moving your furniture around. If any of your floors are uneven, fix them. If there are any pets around you, be aware of where they are. Review your medicines with your doctor. Some medicines can make you feel dizzy. This can increase your chance of falling. Ask your doctor what other things that you can do to help prevent falls. This information is not intended to replace advice given to you by your health care provider. Make sure you discuss any questions you have with your health care provider. Document Released: 01/23/2009 Document Revised: 09/04/2015 Document Reviewed: 05/03/2014 Elsevier Interactive Patient Education  2017 [employer].

## 2022-11-18 NOTE — Progress Notes (Signed)
Subjective:   Nathan Watts is a 77 y.o. male who presents for Medicare Annual/Subsequent preventive examination.  Visit Complete: Virtual  I connected with  Magda Bernheim on 11/18/22 by a audio enabled telemedicine application and verified that I am speaking with the correct person using two identifiers.  Patient Location: Home  Provider Location: Home Office  I discussed the limitations of evaluation and management by telemedicine. The patient expressed understanding and agreed to proceed.  Vital Signs: Unable to obtain new vitals due to this being a telehealth visit.   Review of Systems     Cardiac Risk Factors include: advanced age (>70men, >70 women);dyslipidemia;hypertension;male gender;Other (see comment), Risk factor comments: AFib, Aortic atherosclerosis     Objective:    Today's Vitals   11/18/22 0858  Weight: 170 lb (77.1 kg)  Height: 5\' 11"  (1.803 m)   Body mass index is 23.71 kg/m.     11/18/2022    9:10 AM 05/04/2022   10:47 AM 04/20/2022   11:49 AM 03/26/2022    8:43 AM 09/18/2021    2:08 PM 06/15/2021   11:46 AM 04/24/2021    7:33 AM  Advanced Directives  Does Patient Have a Medical Advance Directive? Yes Yes Yes Yes Yes Yes Yes  Type of Estate agent of [place];Living will Healthcare Power of [place];Living will Healthcare Power of [place];Living will Healthcare Power of [place];Living will Healthcare Power of [place];Living will Healthcare Power of [place];Living will   Does patient want to make changes to medical advance directive? No - Patient declined No - Patient declined No - Patient declined No - Patient declined No - Patient declined  No - Patient declined  Copy of Healthcare Power of Attorney in Chart? No - copy requested Yes - validated most recent copy scanned in chart (See row information) Yes - validated most recent copy scanned in chart (See row information) No - copy requested No - copy requested Yes - validated  most recent copy scanned in chart (See row information)     Current Medications (verified) Outpatient Encounter Medications as of 11/18/2022  Medication Sig   apixaban (ELIQUIS) 5 MG TABS tablet Take 1 tablet (5 mg total) by mouth 2 (two) times daily.   atorvastatin (LIPITOR) 20 MG tablet Take 1 tablet (20 mg total) by mouth daily.   cyanocobalamin 1000 MCG tablet Take 1,000 mcg by mouth in the morning.   Multiple Vitamin (MULTIVITAMIN WITH MINERALS) TABS tablet Take 1 tablet by mouth in the morning.   Omega-3 Fatty Acids (FISH OIL) 1200 MG CAPS Take 1,200 mg by mouth in the morning.   pantoprazole (PROTONIX) 40 MG tablet Take 1 tablet (40 mg total) by mouth daily.   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   Probiotic Product (PROBIOTIC PO) Take 1 capsule by mouth in the morning.   Psyllium (METAMUCIL PO) Take 6 g by mouth in the morning. Metamucil Fiber   ipratropium (ATROVENT) 0.06 % nasal spray Place 2 sprays into both nostrils 4 (four) times daily. (Patient not taking: Reported on 11/18/2022)   metroNIDAZOLE (METROGEL) 0.75 % gel Apply 1 application  topically at bedtime. to face (Patient not taking: Reported on 11/18/2022)   nystatin (MYCOSTATIN/NYSTOP) powder Apply 1 Application topically 2 (two) times daily. (Patient not taking: Reported on 11/18/2022)   nystatin cream (MYCOSTATIN) Apply 1 Application topically 2 (two) times daily. (Patient not taking: Reported on 11/18/2022)   No facility-administered encounter medications on  file as of 11/18/2022.    Allergies (verified) Crestor [rosuvastatin] and Hydrocodone-chlorpheniramine   History: Past Medical History:  Diagnosis Date   BPH (benign prostatic hyperplasia)    Fatty infiltration of liver    GERD (gastroesophageal reflux disease)    Hypercholesterolemia    Hyperglycemia    Hypertension    Thrombocytopenia (HCC)    Thyroid nodule    Past Surgical History:  Procedure Laterality  Date   ATRIAL FIBRILLATION ABLATION N/A 03/26/2022   Procedure: ATRIAL FIBRILLATION ABLATION;  Surgeon: Lanier Prude, MD;  Location: [facility];  Service: Cardiovascular;  Laterality: N/A;   CATARACT EXTRACTION W/PHACO Left 04/20/2022   Procedure: CATARACT EXTRACTION PHACO AND INTRAOCULAR LENS PLACEMENT (IOC) LEFT 13.73 01:12.4;  Surgeon: Galen Manila, MD;  Location: [facility];  Service: Ophthalmology;  Laterality: Left;   CATARACT EXTRACTION W/PHACO Right 05/04/2022   Procedure: CATARACT EXTRACTION PHACO AND INTRAOCULAR LENS PLACEMENT (IOC) RIGHT;  Surgeon: Galen Manila, MD;  Location: [facility];  Service: Ophthalmology;  Laterality: Right;  9.42 0:54.4   COLONOSCOPY WITH PROPOFOL N/A 10/02/2014   Procedure: COLONOSCOPY WITH PROPOFOL;  Surgeon: Scot Jun, MD;  Location: [facility] ENDOSCOPY;  Service: Endoscopy;  Laterality: N/A;   COLONOSCOPY WITH PROPOFOL N/A 06/15/2021   Procedure: COLONOSCOPY WITH PROPOFOL;  Surgeon: Regis Bill, MD;  Location: [facility] ENDOSCOPY;  Service: Endoscopy;  Laterality: N/A;   ESOPHAGOGASTRODUODENOSCOPY (EGD) WITH PROPOFOL N/A 06/15/2021   Procedure: ESOPHAGOGASTRODUODENOSCOPY (EGD) WITH PROPOFOL;  Surgeon: Regis Bill, MD;  Location: [facility] ENDOSCOPY;  Service: Endoscopy;  Laterality: N/A;   HOLEP-LASER ENUCLEATION OF THE PROSTATE WITH MORCELLATION N/A 04/24/2021   Procedure: HOLEP-LASER ENUCLEATION OF THE PROSTATE WITH MORCELLATION;  Surgeon: Sondra Come, MD;  Location: [facility] ORS;  Service: Urology;  Laterality: N/A;   INGUINAL HERNIA REPAIR     TONSILLECTOMY     TRANSURETHRAL RESECTION OF BLADDER TUMOR WITH MITOMYCIN-C N/A 04/24/2021   Procedure: 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;  Surgeon: Sondra Come, MD;  Location: [facility] ORS;  Service: Urology;  Laterality: N/A;   Family History  Problem Relation Age of Onset   Prostate cancer Father    Heart disease Father        s/p CABG    Stroke Mother    Colon cancer Neg Hx    Social History   Socioeconomic History   Marital status: Married    Spouse name: Peggy   Number of children: 4   Years of education: Not on file   Highest education level: Bachelor's degree (e.g., BA, AB, BS)  Occupational History   Occupation: retired Dentist  Tobacco Use   Smoking status: Former    Current packs/day: 0.00    Average packs/day: 1 pack/day for 30.0 years (30.0 ttl pk-yrs)    Types: Cigarettes    Start date: 04/12/1968    Quit date: 04/12/1998    Years since quitting: 24.6    Passive exposure: Past   Smokeless tobacco: Never  Vaping Use   Vaping status: Never Used  Substance and Sexual Activity   Alcohol use: Yes    Alcohol/week: 2.0 standard drinks of alcohol    Types: 2 Cans of beer per week    Comment: 1-2 beers 1-2 days per week   Drug use: Never   Sexual activity: Never  Other Topics Concern   Not on file  Social History Narrative   He is married. Has four children   Social Determinants of Health  Financial Resource Strain: Low Risk  (11/18/2022)   Overall Financial Resource Strain (CARDIA)    Difficulty of Paying Living Expenses: Not hard at all  Food Insecurity: No Food Insecurity (11/18/2022)   Hunger Vital Sign    Worried About Running Out of Food in the Last Year: Never true    Ran Out of Food in the Last Year: Never true  Transportation Needs: No Transportation Needs (11/18/2022)   PRAPARE - Administrator, Civil Service (Medical): No    Lack of Transportation (Non-Medical): No  Physical Activity: Sufficiently Active (11/18/2022)   Exercise Vital Sign    Days of Exercise per Week: 6 days    Minutes of Exercise per Session: 90 min  Stress: Stress Concern Present (11/18/2022)   [employer] of Occupational Health - Occupational Stress Questionnaire    Feeling of Stress : To some extent  Social Connections: Socially Integrated (11/18/2022)   Social Connection and Isolation Panel  [NHANES]    Frequency of Communication with Friends and Family: More than three times a week    Frequency of Social Gatherings with Friends and Family: Three times a week    Attends Religious Services: More than 4 times per year    Active Member of Clubs or Organizations: Yes    Attends Engineer, structural: More than 4 times per year    Marital Status: Married    Tobacco Counseling Counseling given: Not Answered   Clinical Intake:  Pre-visit preparation completed: Yes  Pain : No/denies pain     BMI - recorded: 23.71 Nutritional Status: BMI of 19-24  Normal Nutritional Risks: None Diabetes: No  How often do you need to have someone help you when you read instructions, pamphlets, or other written materials from your doctor or pharmacy?: 1 - Never  Interpreter Needed?: No  Information entered by :: Tora Kindred, CMA   Activities of Daily Living    11/18/2022    9:00 AM 05/04/2022   10:43 AM  In your present state of health, do you have any difficulty performing the following activities:  Hearing? 0 0  Vision? 0 0  Difficulty concentrating or making decisions? 0 0  Walking or climbing stairs? 0 0  Dressing or bathing? 0 0  Doing errands, shopping? 0   Preparing Food and eating ? N   Using the Toilet? N   In the past six months, have you accidently leaked urine? N   Do you have problems with loss of bowel control? N   Managing your Medications? N   Managing your Finances? N   Housekeeping or managing your Housekeeping? N     Patient Care Team: Dale [place], MD as PCP - General (Internal Medicine) Lanier Prude, MD as PCP - Electrophysiology (Cardiology)  Indicate any recent Medical Services you may have received from other than [facility] providers in the past year (date may be approximate).     Assessment:   This is a routine wellness examination for Ferdie.  Hearing/Vision screen Hearing Screening - Comments:: Denies hearing loss  Dietary  issues and exercise activities discussed:     Goals Addressed               This Visit's Progress     Patient Stated (pt-stated)        Maintain current health      Depression Screen    11/18/2022    9:08 AM 06/17/2022    4:18 PM 03/10/2022    9:58  AM 09/18/2021    3:02 PM 09/01/2021    8:31 AM 03/02/2021    8:44 AM 02/04/2020    8:54 AM  PHQ 2/9 Scores  PHQ - 2 Score 0 0 0 0 0 0 0  PHQ- 9 Score 0          Fall Risk    11/18/2022    9:11 AM 03/10/2022    9:57 AM 09/01/2021    8:31 AM 03/02/2021    8:44 AM 02/04/2020    8:57 AM  Fall Risk   Falls in the past year? 0 0 0 0 0  Number falls in past yr: 0 0  0 0  Injury with Fall? 0 0  0 0  Risk for fall due to : No Fall Risks No Fall Risks No Fall Risks No Fall Risks   Follow up Falls prevention discussed Falls evaluation completed Falls evaluation completed Falls evaluation completed Falls evaluation completed    MEDICARE RISK AT HOME:  Medicare Risk at Home - 11/18/22 0911     Any stairs in or around the home? Yes    If so, are there any without handrails? No    Home free of loose throw rugs in walkways, pet beds, electrical cords, etc? Yes    Adequate lighting in your home to reduce risk of falls? Yes    Life alert? No    Use of a cane, walker or w/c? No    Grab bars in the bathroom? Yes    Shower chair or bench in shower? No    Elevated toilet seat or a handicapped toilet? Yes             TIMED UP AND GO:  Was the test performed?  No    Cognitive Function:        11/18/2022    9:12 AM 02/01/2019    8:52 AM  6CIT Screen  What Year? 0 points 0 points  What month? 0 points 0 points  What time? 0 points 0 points  Count back from 20 0 points 0 points  Months in reverse 0 points 0 points  Repeat phrase 0 points 0 points  Total Score 0 points 0 points    Immunizations Immunization History  Administered Date(s) Administered   Fluad Quad(high Dose 65+) 12/12/2018, 01/04/2020, 03/02/2021, 01/06/2022    Hepb-cpg 04/17/2021   Influenza Split 01/28/2012   Influenza, High Dose Seasonal PF 01/24/2016, 02/04/2017, 01/16/2018   Influenza,inj,Quad PF,6+ Mos 12/28/2012, 01/05/2014, 01/06/2015   PFIZER(Purple Top)SARS-COV-2 Vaccination 04/18/2019, 05/09/2019, 12/04/2019, 07/22/2020   Pfizer Covid-19 Vaccine Bivalent Booster 4yrs & up 01/08/2021   Pneumococcal Conjugate-13 01/23/2014   Pneumococcal Polysaccharide-23 03/14/2012   Tdap 12/07/2016   Zoster Recombinant(Shingrix) 11/02/2016, 03/09/2017    TDAP status: Up to date  Flu Vaccine status: Due, Education has been provided regarding the importance of this vaccine. Advised may receive this vaccine at local pharmacy or Health Dept. Aware to provide a copy of the vaccination record if obtained from local pharmacy or Health Dept. Verbalized acceptance and understanding.  Pneumococcal vaccine status: Up to date  Covid-19 vaccine status: Information provided on how to obtain vaccines.   Qualifies for Shingles Vaccine? Yes   Zostavax completed No   Shingrix Completed?: Yes  Screening Tests Health Maintenance  Topic Date Due   COVID-19 Vaccine (6 - 2023-24 season) 12/11/2021   INFLUENZA VACCINE  11/11/2022   Medicare Annual Wellness (AWV)  11/18/2023   DTaP/Tdap/Td (2 - Td or Tdap) 12/08/2026  Pneumonia Vaccine 23+ Years old  Completed   Hepatitis C Screening  Completed   Zoster Vaccines- Shingrix  Completed   HPV VACCINES  Aged Out   Colonoscopy  Discontinued    Health Maintenance  Health Maintenance Due  Topic Date Due   COVID-19 Vaccine (6 - 2023-24 season) 12/11/2021   INFLUENZA VACCINE  11/11/2022    Colorectal cancer screening: No longer required.   Lung Cancer Screening: (Low Dose CT Chest recommended if Age 64-80 years, 20 pack-year currently smoking OR have quit w/in 15years.) does not qualify.   Lung Cancer Screening Referral: n/a  Additional Screening:  Hepatitis C Screening: does not qualify; Completed  12/10/15  Vision Screening: Recommended annual ophthalmology exams for early detection of glaucoma and other disorders of the eye.  Dental Screening: Recommended annual dental exams for proper oral hygiene  Community Resource Referral / Chronic Care Management: CRR required this visit?  No   CCM required this visit?  No     Plan:     I have personally reviewed and noted the following in the patient's chart:   Medical and social history Use of alcohol, tobacco or illicit drugs  Current medications and supplements including opioid prescriptions. Patient is not currently taking opioid prescriptions. Functional ability and status Nutritional status Physical activity Advanced directives List of other physicians Hospitalizations, surgeries, and ER visits in previous 12 months Vitals Screenings to include cognitive, depression, and falls Referrals and appointments  In addition, I have reviewed and discussed with patient certain preventive protocols, quality metrics, and best practice recommendations. A written personalized care plan for preventive services as well as general preventive health recommendations were provided to patient.     Tora Kindred, CMA   11/18/2022   After Visit Summary: (MyChart) Due to this being a telephonic visit, the after visit summary with patients personalized plan was offered to patient via MyChart   Nurse Notes:  Will get flu vaccine in the fall.  Will think about getting a covid booster.

## 2022-11-22 ENCOUNTER — Encounter: Payer: Self-pay | Admitting: Cardiology

## 2022-11-22 ENCOUNTER — Emergency Department: Payer: Medicare Other

## 2022-11-22 ENCOUNTER — Emergency Department
Admission: EM | Admit: 2022-11-22 | Discharge: 2022-11-22 | Disposition: A | Discharge status: Home / Self Care | Payer: Medicare Other | Source: Home / Self Care | Attending: Emergency Medicine | Admitting: Emergency Medicine

## 2022-11-22 ENCOUNTER — Other Ambulatory Visit: Payer: Self-pay

## 2022-11-22 DIAGNOSIS — Z7901 Long term (current) use of anticoagulants: Secondary | ICD-10-CM | POA: Insufficient documentation

## 2022-11-22 DIAGNOSIS — I4891 Unspecified atrial fibrillation: Secondary | ICD-10-CM

## 2022-11-22 DIAGNOSIS — R079 Chest pain, unspecified: Secondary | ICD-10-CM | POA: Diagnosis not present

## 2022-11-22 DIAGNOSIS — R42 Dizziness and giddiness: Secondary | ICD-10-CM | POA: Diagnosis not present

## 2022-11-22 DIAGNOSIS — E860 Dehydration: Secondary | ICD-10-CM | POA: Diagnosis not present

## 2022-11-22 LAB — CBC
HCT: 45.7 % (ref 39.0–52.0)
Hemoglobin: 14.8 g/dL (ref 13.0–17.0)
MCH: 29.5 pg (ref 26.0–34.0)
MCHC: 32.4 g/dL (ref 30.0–36.0)
MCV: 91.2 fL (ref 80.0–100.0)
Platelets: 207 10*3/uL (ref 150–400)
RBC: 5.01 MIL/uL (ref 4.22–5.81)
RDW: 13.5 % (ref 11.5–15.5)
WBC: 12.8 10*3/uL — ABNORMAL HIGH (ref 4.0–10.5)
nRBC: 0 % (ref 0.0–0.2)

## 2022-11-22 LAB — BASIC METABOLIC PANEL
Anion gap: 8 (ref 5–15)
BUN: 15 mg/dL (ref 8–23)
CO2: 25 mmol/L (ref 22–32)
Calcium: 9.4 mg/dL (ref 8.9–10.3)
Chloride: 107 mmol/L (ref 98–111)
Creatinine, Ser: 1.37 mg/dL — ABNORMAL HIGH (ref 0.61–1.24)
GFR, Estimated: 53 mL/min — ABNORMAL LOW (ref >60)
Glucose, Bld: 123 mg/dL — ABNORMAL HIGH (ref 70–99)
Potassium: 4 mmol/L (ref 3.5–5.1)
Sodium: 140 mmol/L (ref 135–145)

## 2022-11-22 LAB — TROPONIN I (HIGH SENSITIVITY)
Troponin I (High Sensitivity): 7 ng/L (ref <18)
Troponin I (High Sensitivity): 10 ng/L (ref <18)

## 2022-11-22 MED ORDER — SODIUM CHLORIDE 0.9 % IV BOLUS
1000.0000 mL | Freq: Once | INTRAVENOUS | Status: AC
  Administered 2022-11-22: 1000 mL via INTRAVENOUS

## 2022-11-22 NOTE — ED Triage Notes (Signed)
Pt to ED for dizziness and not feeling well after walking this am. Reports feeling like he is in a fib, had ablation a couple months ago. Reports was hypotensive this am. +chest pain.

## 2022-11-22 NOTE — Discharge Instructions (Signed)
You were seen in the emergency department today for your lightheadedness.  I suspect this was related to your A-fib being at an elevated heart rate.  Once this was back into a normal rate and rhythm, you are asymptomatic.  There were slight signs of dehydration but otherwise your laboratory workup was reassuring.  Please call your cardiologist tomorrow to set up an outpatient appointment for further assessment.  Please return if you notice any severe worsening symptoms.

## 2022-11-22 NOTE — ED Provider Notes (Signed)
[facility] Provider Note    Event Date/Time   First MD Initiated Contact with Patient 11/22/22 1045     (approximate)   History   Dizziness   HPI Nathan Watts is a 77 y.o. male with paroxysmal A-fib s/p ablation on Eliquis who presents today for lightheadedness.  Patient states overnight last night he started having some lightheadedness symptoms.  He was doing his normal 5 mile walk this morning when he got lightheaded afterwards and felt palpitations.  He said at 1 point he noted slight chest pain but that left almost immediately and he did not have any additional after that.  He found his heart rate greater than 110 at home when checking and came to the ED for further evaluation.  He is not on any rate controlling medicine at this time since he got his ablation.     Physical Exam   Triage Vital Signs: ED Triage Vitals  Encounter Vitals Group     BP 11/22/22 1032 (!) 127/93     Systolic BP Percentile --      Diastolic BP Percentile --      Pulse Rate 11/22/22 1032 (!) 145     Resp 11/22/22 1032 18     Temp 11/22/22 1032 98.4 F (36.9 C)     Temp src --      SpO2 11/22/22 1032 93 %     Weight 11/22/22 1038 170 lb (77.1 kg)     Height 11/22/22 1038 5\' 11"  (1.803 m)     Head Circumference --      Peak Flow --      Pain Score 11/22/22 1038 3     Pain Loc --      Pain Education --      Exclude from Growth Chart --     Most recent vital signs: Vitals:   11/22/22 1330 11/22/22 1359  BP: 108/72   Pulse:  75  Resp: 17 17  Temp:    SpO2: 98% 100%   Physical Exam: I have reviewed the vital signs and nursing notes. General: Awake, alert, no acute distress.  Nontoxic appearing. Head:  Atraumatic, normocephalic.   ENT:  EOM intact, PERRL. Oral mucosa is pink and moist with no lesions. Neck: Neck is supple with full range of motion, No meningeal signs. Cardiovascular:  RRR, No murmurs. Peripheral pulses palpable and equal  bilaterally. Respiratory:  Symmetrical chest wall expansion.  No rhonchi, rales, or wheezes.  Good air movement throughout.  No use of accessory muscles.   Musculoskeletal:  No cyanosis or edema. Moving extremities with full ROM Abdomen:  Soft, nontender, nondistended. Neuro:  GCS 15, moving all four extremities, interacting appropriately. Speech clear. Psych:  Calm, appropriate.   Skin:  Warm, dry, no rash.    ED Results / Procedures / Treatments   Labs (all labs ordered are listed, but only abnormal results are displayed) Labs Reviewed  BASIC METABOLIC PANEL - Abnormal; Notable for the following components:      Result Value   Glucose, Bld 123 (*)    Creatinine, Ser 1.37 (*)    GFR, Estimated 53 (*)    All other components within normal limits  CBC - Abnormal; Notable for the following components:   WBC 12.8 (*)    All other components within normal limits  TROPONIN I (HIGH SENSITIVITY)  TROPONIN I (HIGH SENSITIVITY)     EKG EKG here on arrival per my interpretation shows rate of 78,  normal sinus rhythm, normal axis, no acute ST elevations or depressions.  There is T wave inversions present in lead III and aVF.   RADIOLOGY See ED course   PROCEDURES:  Critical Care performed: No  Procedures   MEDICATIONS ORDERED IN ED: Medications  sodium chloride 0.9 % bolus 1,000 mL (1,000 mLs Intravenous New Bag/Given 11/22/22 1125)     IMPRESSION / MDM / ASSESSMENT AND PLAN / ED COURSE  I reviewed the triage vital signs and the nursing notes.                              Differential diagnosis includes, but is not limited to, A-fib with RVR, ACS, pneumonia, dehydration, pneumothorax.  Patient's presentation is most consistent with acute presentation with potential threat to life or bodily function.  Patient is a 77 year old male presenting today for lightheadedness in the setting of elevated heart rate.  Captured his heart rate at home in the 120s and initially here was  in the 140s.  By the time an EKG was performed and I assessed him in the room, heart rate was back down to the 60s.  I strongly suspect that he was briefly in A-fib with RVR and on top of his laboratory workup showing slight dehydration, was the main factor for his symptoms.  With his heart rate normalized, patient was asymptomatic throughout his entire ED visit.  Troponins of 7 and 10 were reassuring with no ongoing chest pain.  Patient ambulated throughout the ED without issue.  He was safe for discharge at this time and will see his cardiologist in the next couple of days to discuss ongoing A-fib symptoms after his ablation earlier this year.  He was given strict return precautions.  Clinical Course as of 11/22/22 1400  Mon Nov 22, 2022  1046 WBC(!): 12.8 Mild leukocytosis [DW]  1124 Pulse Rate: 66 [DW]  1150 Basic metabolic panel(!) Mild AKI.  Giving fluids at this time. [DW]  1224 DG Chest 2 View Per my interpretation, no acute abnormalities [DW]  1331 Troponin I (High Sensitivity): 10 Delta 3.  No ongoing symptoms. [DW]    Clinical Course User Index [DW] Janith Lima, MD     FINAL CLINICAL IMPRESSION(S) / ED DIAGNOSES   Final diagnoses:  Atrial fibrillation with RVR (HCC)  Lightheadedness  Dehydration     Rx / DC Orders   ED Discharge Orders     None        Note:  This document was prepared using Dragon voice recognition software and may include unintentional dictation errors.   Janith Lima, MD 11/22/22 [phone]

## 2022-11-22 NOTE — ED Notes (Signed)
Ambulated pt and vitals remained stable

## 2022-11-22 NOTE — ED Notes (Signed)
Pt initially a fib HR 130s', while placing on EKG HR 70-80s, pt reports feels better.

## 2022-11-26 ENCOUNTER — Ambulatory Visit (INDEPENDENT_AMBULATORY_CARE_PROVIDER_SITE_OTHER): Payer: Medicare Other

## 2022-11-26 ENCOUNTER — Ambulatory Visit: Payer: Medicare Other | Attending: Cardiology | Admitting: Cardiology

## 2022-11-26 ENCOUNTER — Encounter: Payer: Self-pay | Admitting: Cardiology

## 2022-11-26 VITALS — BP 120/74 | HR 52 | Ht 71.0 in | Wt 183.4 lb

## 2022-11-26 DIAGNOSIS — D6869 Other thrombophilia: Secondary | ICD-10-CM | POA: Diagnosis not present

## 2022-11-26 DIAGNOSIS — I10 Essential (primary) hypertension: Secondary | ICD-10-CM | POA: Diagnosis not present

## 2022-11-26 DIAGNOSIS — I4892 Unspecified atrial flutter: Secondary | ICD-10-CM | POA: Diagnosis not present

## 2022-11-26 DIAGNOSIS — I480 Paroxysmal atrial fibrillation: Secondary | ICD-10-CM | POA: Diagnosis not present

## 2022-11-26 MED ORDER — METOPROLOL TARTRATE 25 MG PO TABS: 25.0000 mg | ORAL_TABLET | Freq: Every day | ORAL | 3 refills | Status: DC | PRN

## 2022-11-26 NOTE — Patient Instructions (Signed)
Medication Instructions:  START Metoprolol 25 mg as needed for heart rate above 110.   *If you need a refill on your cardiac medications before your next appointment, please call your pharmacy*  Testing/Procedures:  ZIO XT- Long Term Monitor Instructions  Your physician has requested you wear a ZIO patch monitor for 14 days.  This is a single patch monitor. [employer] supplies one patch monitor per enrollment. Additional stickers are not available. Please do not apply patch if you will be having a Nuclear Stress Test,  Echocardiogram, Cardiac CT, MRI, or Chest Xray during the period you would be wearing the  monitor. The patch cannot be worn during these tests. You cannot remove and re-apply the  ZIO XT patch monitor.  Your ZIO patch monitor will be mailed 3 day USPS to your address on file. It may take 3-5 days  to receive your monitor after you have been enrolled.  Once you have received your monitor, please review the enclosed instructions. Your monitor  has already been registered assigning a specific monitor serial # to you.  Billing and Patient Assistance Program Information  We have supplied [employer] with any of your insurance information on file for billing purposes. [employer] offers a sliding scale Patient Assistance Program for patients that do not have  insurance, or whose insurance does not completely cover the cost of the ZIO monitor.  You must apply for the Patient Assistance Program to qualify for this discounted rate.  To apply, please call [employer] at [phone], select option 4, select option 2, ask to apply for  Patient Assistance Program. Meredeth Ide will ask your household income, and how many people  are in your household. They will quote your out-of-pocket cost based on that information.  [employer] will also be able to set up a 50-month, interest-free payment plan if needed.  Applying the monitor   Shave hair from upper left chest.  Hold abrader disc by orange tab. Rub  abrader in 40 strokes over the upper left chest as  indicated in your monitor instructions.  Clean area with 4 enclosed alcohol pads. Let dry.  Apply patch as indicated in monitor instructions. Patch will be placed under collarbone on left  side of chest with arrow pointing upward.  Rub patch adhesive wings for 2 minutes. Remove white label marked "1". Remove the white  label marked "2". Rub patch adhesive wings for 2 additional minutes.  While looking in a mirror, press and release button in center of patch. A small green light will  flash 3-4 times. This will be your only indicator that the monitor has been turned on.  Do not shower for the first 24 hours. You may shower after the first 24 hours.  Press the button if you feel a symptom. You will hear a small click. Record Date, Time and  Symptom in the Patient Logbook.  When you are ready to remove the patch, follow instructions on the last 2 pages of Patient  Logbook. Stick patch monitor onto the last page of Patient Logbook.  Place Patient Logbook in the blue and white box. Use locking tab on box and tape box closed  securely. The blue and white box has prepaid postage on it. Please place it in the mailbox as  soon as possible. Your physician should have your test results approximately 7 days after the  monitor has been mailed back to [facility].  Call [facility] Customer Care at [phone] if you have questions regarding  your ZIO XT  patch monitor. Call them immediately if you see an orange light blinking on your  monitor.  If your monitor falls off in less than 4 days, contact our Monitor department at [phone].  If your monitor becomes loose or falls off after 4 days call [employer] at [phone] for  suggestions on securing your monitor   WatchPAT?  Is a FDA cleared portable home sleep study test that uses a watch and 3 points of contact to monitor 7 different channels, including your heart rate, oxygen saturations,  body position, snoring, and chest motion.  The study is easy to use from the comfort of your own home and accurately detect sleep apnea.  Before bed, you attach the chest sensor, attached the sleep apnea bracelet to your nondominant hand, and attach the finger probe.  After the study, the raw data is downloaded from the watch and scored for apnea events.   For more information: [email or web address]  Patient Testing Instructions:  Do not put battery into the device until bedtime when you are ready to begin the test. Please call the support number if you need assistance after following the instructions below: 24 hour support line- [phone] or [facility] support at [phone] (option 2)  Download the IntelWatchPAT One" app through the google play store or App Store  Be sure to turn on or enable access to bluetooth in settlings on your smartphone/ device  Make sure no other bluetooth devices are on and within the vicinity of your smartphone/ device and WatchPAT watch during testing.  Make sure to leave your smart phone/ device plugged in and charging all night.  When ready for bed:  Follow the instructions step by step in the WatchPAT One App to activate the testing device. For additional instructions, including video instruction, visit the WatchPAT One video on Youtube. You can search for WatchPat One within Youtube (video is 4 minutes and 18 seconds) or enter: https://youtube/[email or web address]?[email or web address] Please note: You will be prompted to enter a Pin to connect via bluetooth when starting the test. The PIN will be assigned to you when you receive the test.  The device is disposable, but it recommended that you retain the device until you receive a call letting you know the study has been received and the results have been interpreted.  We will let you know if the study did not transmit to [place] properly after the test is completed. You do not need to call us to confirm the receipt of the  test.  Please complete the test within 48 hours of receiving PIN.   Frequently Asked Questions:  What is Watch Dennie Bible one?  A single use fully disposable home sleep apnea testing device and will not need to be returned after completion.  What are the requirements to use WatchPAT one?  The be able to have a successful watchpat one sleep study, you should have your Watch pat one device, your smart phone, watch pat one app, your PIN number and Internet access What type of phone do I need?  You should have a smart phone that uses Android 5.1 and above or any Iphone with IOS 10 and above How can I download the WatchPAT one app?  Based on your device type search for WatchPAT one app either in google play for android devices or APP store for Iphone's Where will I get my PIN for the study?  Your PIN will be provided by your physician's office. It is used for authentication and if you lose/forget your PIN,  please reach out to your providers office.  I do not have Internet at home. Can I do WatchPAT one study?  WatchPAT One needs Internet connection throughout the night to be able to transmit the sleep data. You can use your home/local internet or your cellular's data package. However, it is always recommended to use home/local Internet. It is estimated that between 20MB-30MB will be used with each study.However, the application will be looking for space in the phone to start the study.  What happens if I lose internet or bluetooth connection?  During the internet disconnection, your phone will not be able to transmit the sleep data. All the data, will be stored in your phone. As soon as the internet connection is back on, the phone will being sending the sleep data. During the bluetooth disconnection, WatchPAT one will not be able to to send the sleep data to your phone. Data will be kept in the [facility] one until two devices have bluetooth connection back on. As soon as the connection is back on,  WatchPAT one will send the sleep data to the phone.  How long do I need to wear the WatchPAT one?  After you start the study, you should wear the device at least 6 hours.  How far should I keep my phone from the device?  During the night, your phone should be within 15 feet.  What happens if I leave the room for restroom or other reasons?  Leaving the room for any reason will not cause any problem. As soon as your get back to the room, both devices will reconnect and will continue to send the sleep data. Can I use my phone during the sleep study?  Yes, you can use your phone as usual during the study. But it is recommended to put your watchpat one on when you are ready to go to bed.  How will I get my study results?  A soon as you completed your study, your sleep data will be sent to the provider. They will then share the results with you when they are ready.      Follow-Up: At [facility], you and your health needs are our priority.  As part of our continuing mission to provide you with exceptional heart care, we have created designated Provider Care Teams.  These Care Teams include your primary Cardiologist (physician) and Advanced Practice Providers (APPs -  Physician Assistants and Nurse Practitioners) who all work together to provide you with the care you need, when you need it.  We recommend signing up for the patient portal called "MyChart".  Sign up information is provided on this After Visit Summary.  MyChart is used to connect with patients for Virtual Visits (Telemedicine).  Patients are able to view lab/test results, encounter notes, upcoming appointments, etc.  Non-urgent messages can be sent to your provider as well.   To learn more about what you can do with MyChart, go to [email or web address].    Your next appointment:   Move appointment with Dr.Lambert 6 weeks out from today.

## 2022-11-26 NOTE — Progress Notes (Signed)
Cardiology Office Note Date:  11/26/2022  Patient ID:  Nathan Watts, Nathan Watts 1946-02-23, MRN [id number] PCP:  Dale [place], MD  Cardiologist:  None Electrophysiologist: Lanier Prude, MD    Chief Complaint: afib  History of Present Illness: Nathan Watts is a 77 y.o. male with PMH notable for parox Afib, aflutter, HTN; seen today for Lanier Prude, MD for acute visit due to Afib.   He is s/p afib, flutter ablation w PVI, CTI 03/2022 by Dr. Lalla Brothers. He last saw Dr. Lalla Brothers 06/2022 with resolution of palpitations. He presented to [facility] ER 8/12 with lightheadedness, palpitation and tachycardia. He self-converted to NSR prior to EKG though ER notes do indicate patient was in afib w rvr   On follow-up today, patient states that he has had several episodes of AFib, often happening at night. The episode earlier this week was the most symptomatic and longest he has had since his ablation earlier this year. In addition to palpitation and lightheaded, he was also SOB and had chest discomfort.  He walks daily every morning, has not noticed decreased exercise tolerance No significant change to ETOH intake - drinks 3-4 drinks / week No tobacco use, no illicit drugs He has been under increased stress caring for his wife, who has worsening health problems lately.  He continues to take eliquis BID, no missed doses, no bleeding concerns.   AAD History: none  Past Medical History:  Diagnosis Date   BPH (benign prostatic hyperplasia)    Fatty infiltration of liver    GERD (gastroesophageal reflux disease)    Hypercholesterolemia    Hyperglycemia    Hypertension    Thrombocytopenia (HCC)    Thyroid nodule     Past Surgical History:  Procedure Laterality Date   ATRIAL FIBRILLATION ABLATION N/A 03/26/2022   Procedure: ATRIAL FIBRILLATION ABLATION;  Surgeon: Lanier Prude, MD;  Location: [facility];  Service: Cardiovascular;  Laterality: N/A;   CATARACT EXTRACTION  W/PHACO Left 04/20/2022   Procedure: CATARACT EXTRACTION PHACO AND INTRAOCULAR LENS PLACEMENT (IOC) LEFT 13.73 01:12.4;  Surgeon: Galen Manila, MD;  Location: [facility];  Service: Ophthalmology;  Laterality: Left;   CATARACT EXTRACTION W/PHACO Right 05/04/2022   Procedure: CATARACT EXTRACTION PHACO AND INTRAOCULAR LENS PLACEMENT (IOC) RIGHT;  Surgeon: Galen Manila, MD;  Location: [facility];  Service: Ophthalmology;  Laterality: Right;  9.42 0:54.4   COLONOSCOPY WITH PROPOFOL N/A 10/02/2014   Procedure: COLONOSCOPY WITH PROPOFOL;  Surgeon: Scot Jun, MD;  Location: [facility] ENDOSCOPY;  Service: Endoscopy;  Laterality: N/A;   COLONOSCOPY WITH PROPOFOL N/A 06/15/2021   Procedure: COLONOSCOPY WITH PROPOFOL;  Surgeon: Regis Bill, MD;  Location: [facility] ENDOSCOPY;  Service: Endoscopy;  Laterality: N/A;   ESOPHAGOGASTRODUODENOSCOPY (EGD) WITH PROPOFOL N/A 06/15/2021   Procedure: ESOPHAGOGASTRODUODENOSCOPY (EGD) WITH PROPOFOL;  Surgeon: Regis Bill, MD;  Location: [facility] ENDOSCOPY;  Service: Endoscopy;  Laterality: N/A;   HOLEP-LASER ENUCLEATION OF THE PROSTATE WITH MORCELLATION N/A 04/24/2021   Procedure: HOLEP-LASER ENUCLEATION OF THE PROSTATE WITH MORCELLATION;  Surgeon: Sondra Come, MD;  Location: [facility] ORS;  Service: Urology;  Laterality: N/A;   INGUINAL HERNIA REPAIR     TONSILLECTOMY     TRANSURETHRAL RESECTION OF BLADDER TUMOR WITH MITOMYCIN-C N/A 04/24/2021   Procedure: 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;  Surgeon: Sondra Come, MD;  Location: [facility] ORS;  Service: Urology;  Laterality: N/A;    Current Outpatient Medications  Medication Instructions   apixaban (ELIQUIS) 5 mg,  Oral, 2 times daily   atorvastatin (LIPITOR) 20 mg, Oral, Daily   cyanocobalamin 1,000 mcg, Oral, Every morning   Fish Oil 1,200 mg, Oral, Every morning   ipratropium (ATROVENT) 0.06 % nasal spray 2 sprays, Each Nare, 4 times daily   metroNIDAZOLE  (METROGEL) 0.75 % gel 1 application , Daily at bedtime   Multiple Vitamin (MULTIVITAMIN WITH MINERALS) TABS tablet 1 tablet, Oral, Every morning   nystatin (MYCOSTATIN/NYSTOP) powder 1 Application, Topical, 2 times daily   nystatin cream (MYCOSTATIN) 1 Application, Topical, 2 times daily   pantoprazole (PROTONIX) 40 mg, Oral, Daily   pimecrolimus (ELIDEL) 1 % cream 1 application , Topical, Daily PRN   Probiotic Product (PROBIOTIC PO) 1 capsule, Oral, Every morning   Psyllium (METAMUCIL PO) 6 g, Oral, Every morning, Metamucil Fiber    Social History:  The patient  reports that he quit smoking about 24 years ago. His smoking use included cigarettes. He started smoking about 54 years ago. He has a 30 pack-year smoking history. He has been exposed to tobacco smoke. He has never used smokeless tobacco. He reports current alcohol use of about 2.0 standard drinks of alcohol per week. He reports that he does not use drugs.   Family History:   The patient's family history includes Heart disease in his father; Prostate cancer in his father; Stroke in his mother.  ROS:  Please see the history of present illness. All other systems are reviewed and otherwise negative.   PHYSICAL EXAM:  VS:  BP 120/74 (BP Location: Left Arm, Patient Position: Sitting, Cuff Size: Normal)   Pulse (!) 52   Ht 5\' 11"  (1.803 m)   Wt 183 lb 6 oz (83.2 kg)   SpO2 98%   BMI 25.58 kg/m  BMI: There is no height or weight on file to calculate BMI.  GEN- The patient is well appearing, alert and oriented x 3 today.   Lungs- Clear to ausculation bilaterally, normal work of breathing.  Heart- Regular rate and rhythm, no murmurs, rubs or gallops Extremities- No peripheral edema, warm, dry   EKG is not ordered. Personal review of EKG from  11/22/2022  shows:  NSR w 1st deg HB, rate 78 PR        Recent Labs: 07/13/2022: ALT 13; TSH 1.11 11/22/2022: BUN 15; Creatinine, Ser 1.37; Hemoglobin 14.8; Platelets 207; Potassium 4.0;  Sodium 140  07/13/2022: Cholesterol 127; HDL 43.60; LDL Cholesterol 72; Total CHOL/HDL Ratio 3; Triglycerides 57.0; VLDL 11.4   Estimated Creatinine Clearance: 47.3 mL/min (A) (by C-G formula based on SCr of 1.37 mg/dL (H)).   Wt Readings from Last 3 Encounters:  11/22/22 170 lb (77.1 kg)  11/18/22 170 lb (77.1 kg)  10/26/22 180 lb (81.6 kg)     Additional studies reviewed include: Previous EP, cardiology notes.   TTE, 01/11/2022  1. Left ventricular ejection fraction, by estimation, is 55 to 60%. The left ventricle has normal function. The left ventricle has no regional wall motion abnormalities. Left ventricular diastolic parameters were normal. The average left ventricular global longitudinal strain is -19.2 %. The global longitudinal strain is normal.   2. Right ventricular systolic function is normal. The right ventricular size is normal. There is normal pulmonary artery systolic pressure.   3. Left atrial size was moderately dilated.   4. The mitral valve is normal in structure. Mild mitral valve regurgitation. No evidence of mitral stenosis.   5. The aortic valve is normal in structure. Aortic valve regurgitation is  not visualized. No aortic stenosis is present.   6. The inferior vena cava is dilated in size with >50% respiratory variability, suggesting right atrial pressure of 8 mmHg.    ASSESSMENT AND PLAN:  #) parox AFib #) typical aflutter #) bradycardia S/p AFib, flutter ablation 03/2022 Has had several brief episodes, and most recent episode was much more symptomatic All episodes have started during the night Add lopressor PRN for afib episodes 2 week monitor to eval burden  #) Hypercoag d/t parox afib CHA2DS2-VASc Score = 4 [CHF History: 0, HTN History: 1, Diabetes History: 0, Stroke History: 0, Vascular Disease History: 1, Age Score: 2, Gender Score: 0].  Therefore, the patient's annual risk of stroke is 4.8 % Stroke ppx - 5mg  eliquis BID, appropriately dosed No  bleeding concerns   #) daytime somnolence Concerned for OSA given daytime somnolence and snoring with h/o afib WatchPat to eval Stop-Bang = 5  #) HTN Well-controlled, cont to monitor Continue physical activity - walking      Current medicines are reviewed at length with the patient today.   The patient does not have concerns regarding his medicines.  The following changes were made today:   START 25mg  lopressor PRN for tachycardia  Labs/ tests ordered today include:  Orders Placed This Encounter  Procedures   LONG TERM MONITOR (3-14 DAYS)   [facility] Sleep Study     Disposition: Follow up with Dr. Lalla Brothers or EP APP  6 weeks    Signed, Sherie Don, NP  11/26/22  9:36 AM  Electrophysiology [facility]

## 2022-12-01 ENCOUNTER — Encounter: Payer: Self-pay | Admitting: Cardiology

## 2022-12-02 ENCOUNTER — Telehealth: Payer: Self-pay | Admitting: *Deleted

## 2022-12-02 DIAGNOSIS — G4733 Obstructive sleep apnea (adult) (pediatric): Secondary | ICD-10-CM | POA: Diagnosis not present

## 2022-12-02 NOTE — Telephone Encounter (Signed)
Prior Authorization for [facility] sent to MEDICARE via web portal. Tracking Number . NO PA REQ

## 2022-12-02 NOTE — Telephone Encounter (Signed)
Noted, patient made aware via mychart.

## 2022-12-03 DIAGNOSIS — I480 Paroxysmal atrial fibrillation: Secondary | ICD-10-CM

## 2022-12-06 ENCOUNTER — Ambulatory Visit: Payer: Medicare Other | Attending: Cardiology

## 2022-12-06 DIAGNOSIS — I480 Paroxysmal atrial fibrillation: Secondary | ICD-10-CM

## 2022-12-06 NOTE — Procedures (Signed)
SLEEP STUDY REPORT Patient Information Study Date: 12/02/2022 Patient Name: Nathan Watts Patient ID: [id number] Birth Date: Jul 25, 1945 Age: 77 Gender: Male BMI: 25.6 (W=183 lb, H=5' 11'') Stopbang: 5 Referring Physician: Sherie Don, NP  TEST DESCRIPTION: Home sleep apnea testing was completed using the WatchPat, a Type 1 device, utilizing  peripheral arterial tonometry (PAT), chest movement, actigraphy, pulse oximetry, pulse rate, body position and snore.  AHI was calculated with apnea and hypopnea using valid sleep time as the denominator. RDI includes apneas,  hypopneas, and RERAs. The data acquired and the scoring of sleep and all associated events were performed in  accordance with the recommended standards and specifications as outlined in the AASM Manual for the Scoring of  Sleep and Associated Events 2.2.0 (2015).   FINDINGS:   1. Mild Obstructive Sleep Apnea with AHI 9.6/hr overall but moderate during REM sleep with REM AHI 17.5/hr.   2. No Central Sleep Apnea with pAHIc 0.9/hr.   3. Oxygen desaturations as low as 81%.   4. Moderate snoring was present. O2 sats were < 88% for 0.5 min.   5. Total sleep time was 8 hrs and 14 min.   6. 23.2% of total sleep time was spent in REM sleep.   7. Normal sleep onset latency at 17 min.   8. Shortened REM sleep onset latency at 51 min.   9. Total awakenings were 11.  10. Arrhythmia detection: Suggestive of possible brief atrial fibrillation lasting 2 hours, 7 minutes and 10 seconds.  This is not diagnostic and further testing with outpatient telemetry monitoring is recommended.  DIAGNOSIS: Mild Obstructive Sleep Apnea (G47.33) Possible Atrial Fibrillation  RECOMMENDATIONS: 1. Clinical correlation of these findings is necessary. The decision to treat obstructive sleep apnea (OSA) is usually  based on the presence of apnea symptoms or the presence of associated medical conditions such as Hypertension,  Congestive Heart  Failure, Atrial Fibrillation or Obesity. The most common symptoms of OSA are snoring, gasping for  breath while sleeping, daytime sleepiness and fatigue.   2. Initiating apnea therapy is recommended given the presence of symptoms and/or associated conditions.  Recommend proceeding with one of the following:   a. Auto-CPAP therapy with a pressure range of 5-20cm H2O.   b. An oral appliance (OA) that can be obtained from certain dentists with expertise in sleep medicine. These are  primarily of use in non-obese patients with mild and moderate disease.   c. An ENT consultation which may be useful to look for specific causes of obstruction and possible treatment  options.   d. If patient is intolerant to PAP therapy, consider referral to ENT for evaluation for hypoglossal nerve stimulator.   3. Close follow-up is necessary to ensure success with CPAP or oral appliance therapy for maximum benefit .  4. A follow-up oximetry study on CPAP is recommended to assess the adequacy of therapy and determine the need  for supplemental oxygen or the potential need for Bi-level therapy. An arterial blood gas to determine the adequacy of  baseline ventilation and oxygenation should also be considered.  5. Healthy sleep recommendations include: adequate nightly sleep (normal 7-9 hrs/night), avoidance of caffeine after  noon and alcohol near bedtime, and maintaining a sleep environment that is cool, dark and quiet.  6. Weight loss for overweight patients is recommended. Even modest amounts of weight loss can significantly  improve the severity of sleep apnea.  7. Snoring recommendations include: weight loss where appropriate, side sleeping, and avoidance of  alcohol before  bed.  8. Operation of motor vehicle should be avoided when sleepy.  Signature: Armanda Magic, MD; [facility]; Diplomat, American Board of Sleep  Medicine Electronically Signed: 12/06/2022 12:05:24 PM

## 2022-12-08 ENCOUNTER — Encounter: Payer: Self-pay | Admitting: Internal Medicine

## 2022-12-08 DIAGNOSIS — G4730 Sleep apnea, unspecified: Secondary | ICD-10-CM | POA: Insufficient documentation

## 2022-12-08 DIAGNOSIS — G4733 Obstructive sleep apnea (adult) (pediatric): Secondary | ICD-10-CM | POA: Insufficient documentation

## 2022-12-09 ENCOUNTER — Encounter: Payer: Self-pay | Admitting: Urology

## 2022-12-09 ENCOUNTER — Ambulatory Visit: Payer: Medicare Other | Admitting: Urology

## 2022-12-09 VITALS — BP 124/77 | HR 56 | Ht 71.0 in | Wt 170.0 lb

## 2022-12-09 DIAGNOSIS — Z8551 Personal history of malignant neoplasm of bladder: Secondary | ICD-10-CM | POA: Diagnosis not present

## 2022-12-09 DIAGNOSIS — Z9889 Other specified postprocedural states: Secondary | ICD-10-CM

## 2022-12-09 MED ORDER — CEPHALEXIN 250 MG PO CAPS
500.0000 mg | ORAL_CAPSULE | Freq: Once | ORAL | Status: AC
Start: 2022-12-09 — End: 2022-12-09
  Administered 2022-12-09: 500 mg via ORAL

## 2022-12-09 NOTE — Progress Notes (Signed)
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  Simultaneous HOLEP(path BPH)  Keflex given for prophylaxis  After informed consent and discussion of the procedure and its risks, Timoth Machida was positioned and prepped in the standard fashion. Cystoscopy was performed with a flexible cystoscope. The urethra, bladder neck and entire bladder was visualized in a standard fashion.  Open prostatic fossa, minimal regrowth of adenoma.  The ureteral orifices were visualized in their normal location and orientation.  Moderate bladder trabeculations, no suspicious lesions, no abnormalities on retroflexion.  Findings: No evidence of recurrence  Assessment and Plan: Normal cystoscopy, no evidence of recurrence He denies any groin pain or urinary complaints aside from some mild postvoid dribbling.  Continue yearly cystoscopy through at least 2028  Legrand Rams, MD 12/09/2022

## 2022-12-10 ENCOUNTER — Ambulatory Visit: Payer: Medicare Other | Admitting: Cardiology

## 2022-12-16 ENCOUNTER — Telehealth: Payer: Self-pay

## 2022-12-16 DIAGNOSIS — G4733 Obstructive sleep apnea (adult) (pediatric): Secondary | ICD-10-CM

## 2022-12-16 NOTE — Telephone Encounter (Signed)
-----   Message from Armanda Magic sent at 12/06/2022 12:07 PM EDT ----- Please let patient know that they have sleep apnea.  Recommend therapeutic CPAP titration for treatment of patient's sleep disordered breathing.  If unable to perform an in lab titration then initiate ResMed auto CPAP from 4 to 15cm H2O with heated humidity and mask of choice and overnight pulse ox on CPAP.

## 2022-12-23 DIAGNOSIS — I480 Paroxysmal atrial fibrillation: Secondary | ICD-10-CM | POA: Diagnosis not present

## 2022-12-30 ENCOUNTER — Telehealth: Payer: Self-pay | Admitting: Cardiology

## 2022-12-30 NOTE — Telephone Encounter (Signed)
Patient called to follow-up on being scheduled for the [facility] in [place].  Patient stated they had not got the referral and provided their fax# [phone] and ph# [phone].

## 2023-01-01 ENCOUNTER — Encounter (INDEPENDENT_AMBULATORY_CARE_PROVIDER_SITE_OTHER): Payer: Medicare Other | Admitting: Cardiology

## 2023-01-01 DIAGNOSIS — G4733 Obstructive sleep apnea (adult) (pediatric): Secondary | ICD-10-CM

## 2023-01-12 ENCOUNTER — Ambulatory Visit: Payer: Medicare Other | Admitting: Cardiology

## 2023-01-23 NOTE — Progress Notes (Unsigned)
Electrophysiology Office Follow up Visit Note:    Date:  01/24/2023   ID:  Nathan Watts, DOB 1945-05-09, MRN [id number]  PCP:  Dale Kirkpatrick, MD  [facility] Cardiologist:  None  [facility] Electrophysiologist:  Lanier Prude, MD    Interval History:    Nathan Watts is a 77 y.o. male who presents for a follow up visit.   Last seen 11/26/2022 by Luella Cook. Had AF/AFL ablation 03/2022. At the appt with Luella Cook he reported several episodes of AF. A monitor was ordered.   He has been doing well overall.  Prior to his catheter ablation last year, he experienced atrial fibrillation on a weekly basis.  After the ablation episodes have been rare.  He did have an episode in August of this year that lasted almost an hour and was severe.  It led to an ER presentation.  He has had a few shorter episodes as well, no other episodes leading to hospitalization.  He has had a sleep study that showed mild sleep apnea.  He has been referred for titration study.     Past medical, surgical, social and family history were reviewed.  ROS:   Please see the history of present illness.    All other systems reviewed and are negative.  EKGs/Labs/Other Studies Reviewed:    The following studies were reviewed today:  12/23/2022 Zio personally reviewed HR 36 - 200, average 58 bpm. 2 nonsustained VT, longest 4 beats. 38 nonsustained SVT, longest 13.1 seconds with an average rate of 126 bpm. Rare supraventricular and ventricular ectopy. No atrial fibrillation. No sustained arrhythmias.       Physical Exam:    VS:  BP 120/70   Pulse (!) 58   Ht 5\' 11"  (1.803 m)   Wt 182 lb (82.6 kg)   SpO2 98%   BMI 25.38 kg/m     Wt Readings from Last 3 Encounters:  01/24/23 182 lb (82.6 kg)  12/09/22 170 lb (77.1 kg)  11/26/22 183 lb 6 oz (83.2 kg)     GEN:  Well nourished, well developed in no acute distress CARDIAC: RRR, no murmurs, rubs, gallops RESPIRATORY:  Clear to auscultation  without rales, wheezing or rhonchi       ASSESSMENT:    1. Paroxysmal atrial fibrillation (HCC)   2. Typical atrial flutter (HCC)   3. Primary hypertension   4. OSA (obstructive sleep apnea)    PLAN:    In order of problems listed above:  #Hx of AF S/p AF/AFL ablation 03/2022 Recent symptomatic recurrence with dyspnea and chest pressure Cont eliquis Cont metoprolol  Discussed treatment options including redo catheter ablation, AAD.  Discussed treatment options today for AF including antiarrhythmic drug therapy and ablation. Discussed risks, recovery and likelihood of success with each treatment strategy. Risk, benefits, and alternatives to EP study and ablation for afib were discussed. These risks include but are not limited to stroke, bleeding, vascular damage, tamponade, perforation, damage to the esophagus, lungs, phrenic nerve and other structures, pulmonary vein stenosis, worsening renal function, coronary vasospasm and death.  Discussed potential need for repeat ablation procedures and antiarrhythmic drugs after an initial ablation. The patient understands these risk and wishes to proceed.  We will therefore proceed with catheter ablation at the next available time.  Carto, ICE, anesthesia are requested for the procedure.  Will also obtain CT PV protocol prior to the procedure to exclude LAA thrombus and further evaluate atrial anatomy.  #HLD Cont atorvastatin  #Sleep apnea  We will send a message to the sleep clinic regarding follow-up.       Signed, Steffanie Dunn, MD, [facility], [facility] 01/24/2023 8:59 AM    Electrophysiology [facility]

## 2023-01-24 ENCOUNTER — Encounter: Payer: Self-pay | Admitting: Cardiology

## 2023-01-24 ENCOUNTER — Ambulatory Visit: Payer: Medicare Other | Attending: Cardiology | Admitting: Cardiology

## 2023-01-24 VITALS — BP 120/70 | HR 58 | Ht 71.0 in | Wt 182.0 lb

## 2023-01-24 DIAGNOSIS — I10 Essential (primary) hypertension: Secondary | ICD-10-CM | POA: Diagnosis not present

## 2023-01-24 DIAGNOSIS — I480 Paroxysmal atrial fibrillation: Secondary | ICD-10-CM | POA: Diagnosis not present

## 2023-01-24 DIAGNOSIS — I483 Typical atrial flutter: Secondary | ICD-10-CM | POA: Diagnosis not present

## 2023-01-24 DIAGNOSIS — G4733 Obstructive sleep apnea (adult) (pediatric): Secondary | ICD-10-CM

## 2023-01-24 NOTE — Patient Instructions (Addendum)
Medication Instructions:  Your physician recommends that you continue on your current medications as directed. Please refer to the Current Medication list given to you today.  *If you need a refill on your cardiac medications before your next appointment, please call your pharmacy*   Lab Work: BMET and CBC  prior to CT scan and ablation  Please go to [facility] [address] ([facility]) [address], [place] [address] You do not need an appointment.  They are open from 7:30 am-4 pm.  Lunch from 1:00 pm- 2:00 pm   Testing/Procedures: Your physician has requested that you have cardiac CT. Cardiac computed tomography (CT) is a painless test that uses an x-ray machine to take clear, detailed pictures of your heart. For further information please visit [email or web address] We will call you to schedule your CT scan. It will be done about one week prior to your ablation.   Your physician has recommended that you have an ablation. Catheter ablation is a medical procedure used to treat some cardiac arrhythmias (irregular heartbeats). During catheter ablation, a long, thin, flexible tube is put into a blood vessel in your groin (upper thigh), or neck. This tube is called an ablation catheter. It is then guided to your heart through the blood vessel. Radio frequency waves destroy small areas of heart tissue where abnormal heartbeats may cause an arrhythmia to start. You are scheduled for Atrial Fibrillation Ablation on Monday, December 30 with Dr. Steffanie Dunn.Please arrive at the Main Entrance A at [facility]: [address] [place], [place] [address] at 8:30 AM    Follow-Up: At [facility], you and your health needs are our priority.  As part of our continuing mission to provide you with exceptional heart care, we have created designated Provider Care Teams.  These Care Teams include your primary Cardiologist (physician) and Advanced Practice Providers (APPs -   Physician Assistants and Nurse Practitioners) who all work together to provide you with the care you need, when you need it.  Your next appointment:   We will call you to arrange your follow up appointments

## 2023-01-24 NOTE — Addendum Note (Signed)
Addended by: Brunetta Genera on: 01/24/2023 03:05 PM   Modules accepted: Orders

## 2023-02-20 ENCOUNTER — Ambulatory Visit (HOSPITAL_BASED_OUTPATIENT_CLINIC_OR_DEPARTMENT_OTHER): Payer: Medicare Other | Attending: Cardiology | Admitting: Cardiology

## 2023-02-20 VITALS — Ht 71.0 in | Wt 182.0 lb

## 2023-02-20 DIAGNOSIS — G4733 Obstructive sleep apnea (adult) (pediatric): Secondary | ICD-10-CM | POA: Diagnosis not present

## 2023-02-22 ENCOUNTER — Other Ambulatory Visit: Payer: Self-pay | Admitting: Cardiology

## 2023-02-22 DIAGNOSIS — I4892 Unspecified atrial flutter: Secondary | ICD-10-CM

## 2023-02-22 NOTE — Addendum Note (Signed)
Addended by: Quintella Reichert on: 02/22/2023 06:20 PM   Modules accepted: Orders

## 2023-02-22 NOTE — Procedures (Signed)
   Patient Name: Nathan Watts, Goodly Date: 02/20/2023 Gender: Male D.O.B: Feb 20, 1946 Age (years): 61 Referring Provider: Armanda Magic MD, ABSM Height (inches): 71 Interpreting Physician: Armanda Magic MD, ABSM Weight (lbs): 182 RPSGT: [place]ug[place], [place] BMI: 25 MRN: [id number] Neck Size: 15.50  CLINICAL INFORMATION The patient is referred for a CPAP titration to treat sleep apnea.  SLEEP STUDY TECHNIQUE As per the AASM Manual for the Scoring of Sleep and Associated Events v2.3 (April 2016) with a hypopnea requiring 4% desaturations.  The channels recorded and monitored were frontal, central and occipital EEG, electrooculogram (EOG), submentalis EMG (chin), nasal and oral airflow, thoracic and abdominal wall motion, anterior tibialis EMG, snore microphone, electrocardiogram, and pulse oximetry. Continuous positive airway pressure (CPAP) was initiated at the beginning of the study and titrated to treat sleep-disordered breathing.  MEDICATIONS Medications self-administered by patient taken the night of the study : N/A  TECHNICIAN COMMENTS Comments added by technician: Patient was restless all through the night. Comments added by scorer: N/A  RESPIRATORY PARAMETERS Optimal PAP Pressure (cm): 8  AHI at Optimal Pressure (/hr):0.7 Overall Minimal O2 (%):89.0  Supine % at Optimal Pressure (%):0 Minimal O2 at Optimal Pressure (%): 90.0   SLEEP ARCHITECTURE The study was initiated at 10:40:12 PM and ended at 4:59:28 AM.  Sleep onset time was 7.0 minutes and the sleep efficiency was 68.2%. The total sleep time was 258.5 minutes.  The patient spent 18.4% of the night in stage N1 sleep, 59.6% in stage N2 sleep, 2.1% in stage N3 and 19.9% in REM.Stage REM latency was 76.0 minutes  Wake after sleep onset was 113.8. Alpha intrusion was absent. Supine sleep was 0.00%.  CARDIAC DATA The 2 lead EKG demonstrated sinus rhythm. The mean heart rate was 48.0 beats per minute. Other EKG findings  include: PACs.  LEG MOVEMENT DATA The total Periodic Limb Movements of Sleep (PLMS) were 0. The PLMS index was 0.0. A PLMS index of <15 is considered normal in adults.  IMPRESSIONS - The optimal PAP pressure was 8 cm of water. - Mild oxygen desaturations were observed during this titration (min O2 = 89.0%). - The patient snored with soft snoring volume during this titration study. - PACs were observed during this study. - Clinically significant periodic limb movements were not noted during this study. Arousals associated with PLMs were significant.  DIAGNOSIS - Obstructive Sleep Apnea (G47.33)  RECOMMENDATIONS - Trial of ResMed CPAP therapy on 8 cm H2O with a Large size Resmed Nasal Pillow AirFit P10 mask and heated humidification. - Avoid alcohol, sedatives and other CNS depressants that may worsen sleep apnea and disrupt normal sleep architecture. - Sleep hygiene should be reviewed to assess factors that may improve sleep quality. - Weight management and regular exercise should be initiated or continued. - Return to Sleep Center for re-evaluation after 4 weeks of therapy  [Electronically signed] 02/22/2023 05:53 PM  Armanda Magic MD, ABSM Diplomate, American Board of Sleep Medicine NPI: [id number]

## 2023-02-22 NOTE — Telephone Encounter (Signed)
Prescription refill request for Eliquis received. Indication: PAF Last office visit: 01/24/23  Jeanie Cooks MD Scr: 1.37 on 11/22/22  Epic Age: 77 Weight: 82.6kg  Based on above findings Eliquis 5mg  twice daily is the appropriate dose.  Refill approved.

## 2023-02-23 DIAGNOSIS — D2262 Melanocytic nevi of left upper limb, including shoulder: Secondary | ICD-10-CM | POA: Diagnosis not present

## 2023-02-23 DIAGNOSIS — D2261 Melanocytic nevi of right upper limb, including shoulder: Secondary | ICD-10-CM | POA: Diagnosis not present

## 2023-02-23 DIAGNOSIS — L570 Actinic keratosis: Secondary | ICD-10-CM | POA: Diagnosis not present

## 2023-02-23 DIAGNOSIS — D225 Melanocytic nevi of trunk: Secondary | ICD-10-CM | POA: Diagnosis not present

## 2023-02-23 DIAGNOSIS — Z85828 Personal history of other malignant neoplasm of skin: Secondary | ICD-10-CM | POA: Diagnosis not present

## 2023-02-28 ENCOUNTER — Telehealth: Payer: Self-pay

## 2023-02-28 DIAGNOSIS — K080 Exfoliation of teeth due to systemic causes: Secondary | ICD-10-CM | POA: Diagnosis not present

## 2023-02-28 NOTE — Telephone Encounter (Signed)
-----   Message from Armanda Magic sent at 02/22/2023  5:55 PM EST ----- Please let patient know that they had a successful PAP titration and let DME know that orders are in EPIC.  Please set up 6 week OV with me.

## 2023-02-28 NOTE — Telephone Encounter (Signed)
CPAP order sent to [facility] 02/28/23

## 2023-03-07 DIAGNOSIS — I10 Essential (primary) hypertension: Secondary | ICD-10-CM | POA: Diagnosis not present

## 2023-03-07 DIAGNOSIS — G4733 Obstructive sleep apnea (adult) (pediatric): Secondary | ICD-10-CM | POA: Diagnosis not present

## 2023-03-14 ENCOUNTER — Telehealth (HOSPITAL_COMMUNITY): Payer: Self-pay | Admitting: Emergency Medicine

## 2023-03-14 ENCOUNTER — Other Ambulatory Visit
Admission: RE | Admit: 2023-03-14 | Discharge: 2023-03-14 | Disposition: A | Discharge status: Home / Self Care | Payer: Medicare Other | Attending: Cardiology | Admitting: Cardiology

## 2023-03-14 DIAGNOSIS — I480 Paroxysmal atrial fibrillation: Secondary | ICD-10-CM | POA: Diagnosis not present

## 2023-03-14 DIAGNOSIS — I10 Essential (primary) hypertension: Secondary | ICD-10-CM | POA: Diagnosis not present

## 2023-03-14 DIAGNOSIS — G4733 Obstructive sleep apnea (adult) (pediatric): Secondary | ICD-10-CM | POA: Diagnosis not present

## 2023-03-14 DIAGNOSIS — I483 Typical atrial flutter: Secondary | ICD-10-CM | POA: Insufficient documentation

## 2023-03-14 LAB — BASIC METABOLIC PANEL
Anion gap: 7 (ref 5–15)
BUN: 15 mg/dL (ref 8–23)
CO2: 27 mmol/L (ref 22–32)
Calcium: 9.1 mg/dL (ref 8.9–10.3)
Chloride: 103 mmol/L (ref 98–111)
Creatinine, Ser: 1.15 mg/dL (ref 0.61–1.24)
GFR, Estimated: >60 mL/min (ref >60)
Glucose, Bld: 90 mg/dL (ref 70–99)
Potassium: 4.1 mmol/L (ref 3.5–5.1)
Sodium: 137 mmol/L (ref 135–145)

## 2023-03-14 LAB — CBC
HCT: 40.4 % (ref 39.0–52.0)
Hemoglobin: 13.5 g/dL (ref 13.0–17.0)
MCH: 29.5 pg (ref 26.0–34.0)
MCHC: 33.4 g/dL (ref 30.0–36.0)
MCV: 88.4 fL (ref 80.0–100.0)
Platelets: 176 10*3/uL (ref 150–400)
RBC: 4.57 MIL/uL (ref 4.22–5.81)
RDW: 13.5 % (ref 11.5–15.5)
WBC: 7.6 10*3/uL (ref 4.0–10.5)
nRBC: 0 % (ref 0.0–0.2)

## 2023-03-14 NOTE — Addendum Note (Signed)
Addended by: Yehuda Savannah on: 03/14/2023 09:41 AM   Modules accepted: Orders

## 2023-03-14 NOTE — Telephone Encounter (Signed)
Attempted to call patient regarding upcoming cardiac CT appointment. °Left message on voicemail with name and callback number °Dwan Fennel RN Navigator Cardiac Imaging °[facility] °[phone] Office °[phone] Cell ° °

## 2023-03-15 ENCOUNTER — Ambulatory Visit
Admission: RE | Admit: 2023-03-15 | Discharge: 2023-03-15 | Disposition: A | Discharge status: Home / Self Care | Payer: Medicare Other | Source: Ambulatory Visit | Attending: Cardiology | Admitting: Cardiology

## 2023-03-15 DIAGNOSIS — G4733 Obstructive sleep apnea (adult) (pediatric): Secondary | ICD-10-CM | POA: Insufficient documentation

## 2023-03-15 DIAGNOSIS — I10 Essential (primary) hypertension: Secondary | ICD-10-CM | POA: Insufficient documentation

## 2023-03-15 DIAGNOSIS — I483 Typical atrial flutter: Secondary | ICD-10-CM | POA: Diagnosis not present

## 2023-03-15 DIAGNOSIS — I480 Paroxysmal atrial fibrillation: Secondary | ICD-10-CM | POA: Insufficient documentation

## 2023-03-15 MED ORDER — SODIUM CHLORIDE 0.9 % IV SOLN: INTRAVENOUS | Status: DC

## 2023-03-15 MED ORDER — IOHEXOL 350 MG/ML SOLN
75.0000 mL | Freq: Once | INTRAVENOUS | Status: AC | PRN
  Administered 2023-03-15: 75 mL via INTRAVENOUS

## 2023-03-15 NOTE — Telephone Encounter (Signed)
 Addressed in 02/28/23 encounter.

## 2023-03-18 ENCOUNTER — Ambulatory Visit: Payer: Medicare Other | Admitting: Internal Medicine

## 2023-03-18 VITALS — BP 128/72 | HR 60 | Temp 98.0°F | Resp 16 | Ht 71.0 in | Wt 179.8 lb

## 2023-03-18 DIAGNOSIS — Z0000 Encounter for general adult medical examination without abnormal findings: Secondary | ICD-10-CM | POA: Diagnosis not present

## 2023-03-18 DIAGNOSIS — E7800 Pure hypercholesterolemia, unspecified: Secondary | ICD-10-CM

## 2023-03-18 DIAGNOSIS — I10 Essential (primary) hypertension: Secondary | ICD-10-CM

## 2023-03-18 DIAGNOSIS — I723 Aneurysm of iliac artery: Secondary | ICD-10-CM

## 2023-03-18 DIAGNOSIS — R739 Hyperglycemia, unspecified: Secondary | ICD-10-CM | POA: Diagnosis not present

## 2023-03-18 DIAGNOSIS — I2510 Atherosclerotic heart disease of native coronary artery without angina pectoris: Secondary | ICD-10-CM

## 2023-03-18 DIAGNOSIS — E041 Nontoxic single thyroid nodule: Secondary | ICD-10-CM

## 2023-03-18 DIAGNOSIS — Z125 Encounter for screening for malignant neoplasm of prostate: Secondary | ICD-10-CM | POA: Diagnosis not present

## 2023-03-18 DIAGNOSIS — I480 Paroxysmal atrial fibrillation: Secondary | ICD-10-CM

## 2023-03-18 DIAGNOSIS — I700 Atherosclerosis of aorta: Secondary | ICD-10-CM

## 2023-03-18 DIAGNOSIS — Z8551 Personal history of malignant neoplasm of bladder: Secondary | ICD-10-CM

## 2023-03-18 DIAGNOSIS — G4730 Sleep apnea, unspecified: Secondary | ICD-10-CM

## 2023-03-18 LAB — HEPATIC FUNCTION PANEL
ALT: 15 U/L (ref 0–53)
AST: 19 U/L (ref 0–37)
Albumin: 4.3 g/dL (ref 3.5–5.2)
Alkaline Phosphatase: 66 U/L (ref 39–117)
Bilirubin, Direct: 0.2 mg/dL (ref 0.0–0.3)
Total Bilirubin: 0.8 mg/dL (ref 0.2–1.2)
Total Protein: 7 g/dL (ref 6.0–8.3)

## 2023-03-18 LAB — HEMOGLOBIN A1C: Hgb A1c MFr Bld: 5.9 % (ref 4.6–6.5)

## 2023-03-18 LAB — PSA, MEDICARE: PSA: 0.32 ng/mL (ref 0.10–4.00)

## 2023-03-18 LAB — LIPID PANEL
Cholesterol: 149 mg/dL (ref 0–200)
HDL: 47.5 mg/dL (ref >39.00)
LDL Cholesterol: 88 mg/dL (ref 0–99)
NonHDL: 101.78
Total CHOL/HDL Ratio: 3
Triglycerides: 70 mg/dL (ref 0.0–149.0)
VLDL: 14 mg/dL (ref 0.0–40.0)

## 2023-03-18 LAB — TSH: TSH: 1.33 u[IU]/mL (ref 0.35–5.50)

## 2023-03-18 NOTE — Progress Notes (Unsigned)
Subjective:    Patient ID: Nathan Watts, male    DOB: [birth year], 77 y.o.   MRN: [id number]  Patient here for  Chief Complaint  Patient presents with   Annual Exam    HPI Here for a physical exam. Had f/u with Dr Lalla Brothers 01/24/23 - f/u afib. Is s/p AF/AFL ablation 03/2022. Continues on eliquis and metoprolol. Recommended catheter ablation - scheduled for 04/11/23. Also diagnosed with sleep apnea. Saw Dr Mayford Knife 02/20/23 - CPAP 8. Just started using cpap. Trying to tolerate. F/u with [facility] 10/26/22 - His noninvasive studies today demonstrate stable sizes with right common iliac artery measuring 1.4 cm and left common iliac artery measuring 1.6 cm. The abdominal aorta has a maximal diameter of 3 cm. 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 Recommended - continue to be checked every other year. Seeing Dr Richardo Hanks for f/u history of bladder cancer. F/u cystoscopy 06/10/22 - no evidence of recurrence. F/u cystoscopy in 6 months. Request PSA to be checked. No chest pain or sob reported.  Still trying to exercise. No abdominal pain or bowel change.  Outside blood pressure readings 125-135/70s.  (Mostly in 120s range).     Past Medical History:  Diagnosis Date   BPH (benign prostatic hyperplasia)    Fatty infiltration of liver    GERD (gastroesophageal reflux disease)    Hypercholesterolemia    Hyperglycemia    Hypertension    Thrombocytopenia (HCC)    Thyroid nodule    Past Surgical History:  Procedure Laterality Date   ATRIAL FIBRILLATION ABLATION N/A 03/26/2022   Procedure: ATRIAL FIBRILLATION ABLATION;  Surgeon: Lanier Prude, MD;  Location: [facility];  Service: Cardiovascular;  Laterality: N/A;   CATARACT EXTRACTION W/PHACO Left 04/20/2022   Procedure: CATARACT EXTRACTION PHACO AND INTRAOCULAR LENS PLACEMENT (IOC) LEFT 13.73 01:12.4;  Surgeon: Galen Manila, MD;  Location: [facility];  Service: Ophthalmology;   Laterality: Left;   CATARACT EXTRACTION W/PHACO Right 05/04/2022   Procedure: CATARACT EXTRACTION PHACO AND INTRAOCULAR LENS PLACEMENT (IOC) RIGHT;  Surgeon: Galen Manila, MD;  Location: [facility];  Service: Ophthalmology;  Laterality: Right;  9.42 0:54.4   COLONOSCOPY WITH PROPOFOL N/A 10/02/2014   Procedure: COLONOSCOPY WITH PROPOFOL;  Surgeon: Scot Jun, MD;  Location: [facility];  Service: Endoscopy;  Laterality: N/A;   COLONOSCOPY WITH PROPOFOL N/A 06/15/2021   Procedure: COLONOSCOPY WITH PROPOFOL;  Surgeon: Regis Bill, MD;  Location: [facility];  Service: Endoscopy;  Laterality: N/A;   ESOPHAGOGASTRODUODENOSCOPY (EGD) WITH PROPOFOL N/A 06/15/2021   Procedure: ESOPHAGOGASTRODUODENOSCOPY (EGD) WITH PROPOFOL;  Surgeon: Regis Bill, MD;  Location: [facility] ENDOS[facility];  Service: Endoscopy;  Laterality: N/A;   HOLEP-LASER ENUCLEATION OF THE PROSTATE WITH MORCELLATION N/A 04/24/2021   Procedure: HOLEP-LASER ENUCLEATION OF THE PROSTATE WITH MORCELLATION;  Surgeon: Sondra Come, MD;  Location: [facility] OR[facility];  Service: Urology;  Laterality: N/A;   INGUINAL HERNIA REPAIR     TONSILLECTOMY     TRANSURETHRAL RESECTION OF BLADDER TUMOR WITH MITOMYCIN-C N/A 04/24/2021   Procedure: 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;  Surgeon: Sondra Come, MD;  Location: [facility];  Service: Urology;  Laterality: N/A;   Family History  Problem Relation Age of Onset   Prostate cancer Father    Heart disease Father        s/p CABG   Stroke Mother    Colon cancer Neg Hx    Social History  Socioeconomic History   Marital status: Married    Spouse name: Gigi Gin   Number of children: 4   Years of education: Not on file   Highest education level: Bachelor's degree (e.g., BA, AB, BS)  Occupational History   Occupation: retired Dentist  Tobacco Use   Smoking status: Former    Current packs/day: 0.00    Average packs/day: 1 pack/day for 30.0 years  (30.0 ttl pk-yrs)    Types: Cigarettes    Start date: 04/12/1968    Quit date: 04/12/1998    Years since quitting: 24.9    Passive exposure: Past   Smokeless tobacco: Never  Vaping Use   Vaping status: Never Used  Substance and Sexual Activity   Alcohol use: Yes    Alcohol/week: 2.0 standard drinks of alcohol    Types: 2 Cans of beer per week    Comment: 1-2 beers 1-2 days per week   Drug use: Never   Sexual activity: Never  Other Topics Concern   Not on file  Social History Narrative   He is married. Has four children   Social Determinants of Health   Financial Resource Strain: Low Risk  (11/18/2022)   Overall Financial Resource Strain (CARDIA)    Difficulty of Paying Living Expenses: Not hard at all  Food Insecurity: No Food Insecurity (11/18/2022)   Hunger Vital Sign    Worried About Running Out of Food in the Last Year: Never true    Ran Out of Food in the Last Year: Never true  Transportation Needs: No Transportation Needs (11/18/2022)   PRAPARE - Administrator, Civil Service (Medical): No    Lack of Transportation (Non-Medical): No  Physical Activity: Sufficiently Active (11/18/2022)   Exercise Vital Sign    Days of Exercise per Week: 6 days    Minutes of Exercise per Session: 90 min  Stress: Stress Concern Present (11/18/2022)   [employer] of Occupational Health - Occupational Stress Questionnaire    Feeling of Stress : To some extent  Social Connections: Socially Integrated (11/18/2022)   Social Connection and Isolation Panel [NHANES]    Frequency of Communication with Friends and Family: More than three times a week    Frequency of Social Gatherings with Friends and Family: Three times a week    Attends Religious Services: More than 4 times per year    Active Member of Clubs or Organizations: Yes    Attends Banker Meetings: More than 4 times per year    Marital Status: Married     Review of Systems  Constitutional:  Negative for appetite  change and unexpected weight change.  HENT:  Negative for congestion, sinus pressure and sore throat.   Eyes:  Negative for pain and visual disturbance.  Respiratory:  Negative for cough, chest tightness and shortness of breath.   Cardiovascular:  Negative for chest pain, palpitations and leg swelling.  Gastrointestinal:  Negative for abdominal pain, diarrhea, nausea and vomiting.  Genitourinary:  Negative for difficulty urinating and dysuria.  Musculoskeletal:  Negative for joint swelling and myalgias.  Skin:  Negative for color change and rash.  Neurological:  Negative for dizziness and headaches.  Hematological:  Negative for adenopathy. Does not bruise/bleed easily.  Psychiatric/Behavioral:  Negative for agitation and dysphoric mood.        Objective:     There were no vitals taken for this visit. Wt Readings from Last 3 Encounters:  02/20/23 182 lb (82.6 kg)  01/24/23 182  lb (82.6 kg)  12/09/22 170 lb (77.1 kg)    Physical Exam Constitutional:      General: He is not in acute distress.    Appearance: Normal appearance. He is well-developed.  HENT:     Head: Normocephalic and atraumatic.     Right Ear: External ear normal.     Left Ear: External ear normal.  Eyes:     General: No scleral icterus.       Right eye: No discharge.        Left eye: No discharge.     Conjunctiva/sclera: Conjunctivae normal.  Neck:     Thyroid: No thyromegaly.  Cardiovascular:     Rate and Rhythm: Normal rate and regular rhythm.  Pulmonary:     Effort: No respiratory distress.     Breath sounds: Normal breath sounds. No wheezing.  Abdominal:     General: Bowel sounds are normal.     Palpations: Abdomen is soft.     Tenderness: There is no abdominal tenderness.  Musculoskeletal:        General: No swelling or tenderness.     Cervical back: Neck supple. No tenderness.  Lymphadenopathy:     Cervical: No cervical adenopathy.  Skin:    Findings: No erythema or rash.  Neurological:      Mental Status: He is alert and oriented to person, place, and time.  Psychiatric:        Mood and Affect: Mood normal.        Behavior: Behavior normal.      Outpatient Encounter Medications as of 03/18/2023  Medication Sig   atorvastatin (LIPITOR) 20 MG tablet Take 1 tablet (20 mg total) by mouth daily.   cyanocobalamin 1000 MCG tablet Take 1,000 mcg by mouth in the morning.   ELIQUIS 5 MG TABS tablet TAKE ONE TABLET BY MOUTH TWICE DAILY   metoprolol tartrate (LOPRESSOR) 25 MG tablet Take 1 tablet (25 mg total) by mouth daily as needed (for heart rate above 110).   metroNIDAZOLE (METROGEL) 0.75 % gel Apply 1 application  topically at bedtime. to face   Multiple Vitamin (MULTIVITAMIN WITH MINERALS) TABS tablet Take 1 tablet by mouth in the morning.   Omega-3 Fatty Acids (FISH OIL) 1200 MG CAPS Take 1,200 mg by mouth in the morning.   pantoprazole (PROTONIX) 40 MG tablet Take 1 tablet (40 mg total) by mouth daily.   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   Probiotic Product (PROBIOTIC PO) Take 1 capsule by mouth in the morning.   Psyllium (METAMUCIL PO) Take 6 g by mouth in the morning. Metamucil Fiber   No facility-administered encounter medications on file as of 03/18/2023.     Lab Results  Component Value Date   WBC 7.6 03/14/2023   HGB 13.5 03/14/2023   HCT 40.4 03/14/2023   PLT 176 03/14/2023   GLUCOSE 90 03/14/2023   CHOL 127 07/13/2022   TRIG 57.0 07/13/2022   HDL 43.60 07/13/2022   LDLCALC 72 07/13/2022   ALT 13 07/13/2022   AST 19 07/13/2022   NA 137 03/14/2023   K 4.1 03/14/2023   CL 103 03/14/2023   CREATININE 1.15 03/14/2023   BUN 15 03/14/2023   CO2 27 03/14/2023   TSH 1.11 07/13/2022   PSA 0.22 03/10/2022   HGBA1C 5.7 07/13/2022    CT CARDIAC MORPH/PULM VEIN W/CM&W/O CA SCORE  Result Date: 03/16/2023 CLINICAL DATA:  Atrial fibrillation scheduled for an ablation.  EXAM: Cardiac CT/CTA TECHNIQUE: The patient  was scanned on a Siemens Somatom scanner. FINDINGS: A 120 kV prospective scan was triggered in the descending thoracic aorta at 111 HU's. Gantry rotation speed was 280 msecs and collimation was .9 mm. No beta blockade and no NTG was given. The 3D data set was reconstructed in 5% intervals of the 60-80 % of the R-R cycle. Diastolic phases were analyzed on a dedicated work station using MPR, MIP and VRT modes. The patient received 75 cc of contrast. There is normal pulmonary vein drainage into the left atrium (2 on the right and 2 on the left) with ostial measurements as follows: RUPV: 17 x 14 mm, Area 20 mm2 RLPV: 11 x 9 mm, Area 9 mm2 LUPV: 17 x 11 mm, Area 15 mm2 LLPV: 14 x 9 mm, Area 11 mm2 The left atrial appendage is a chicken wing type with ostial size 21 x 15 mm and length 23 mm, Area 24 mm2. There is no thrombus in the left atrial appendage. The esophagus runs in the left atrial midline and is not in the proximity to any of the pulmonary veins. Aorta:  Normal caliber.  No dissection, aortic wall calcifications. Aortic Valve:  Trileaflet.  No calcifications. Coronary Arteries: Normal coronary origin. Right dominance. The study was performed without use of NTG and insufficient for plaque evaluation. IMPRESSION: 1. There is normal pulmonary vein drainage into the left atrium. (2 on the right and 2 on the left) with ostial measurements as above. 2. The left atrial appendage is a chicken wing type with ostial size 21 x 15 mm and length 23 mm, Area 24 mm2. There is no thrombus in the left atrial appendage 3. The esophagus runs in the left atrial midline and is not in the proximity to any of the pulmonary veins. 4. Coronary calcium score of 1994. This was 86th percentile for age/gender. Electronically Signed   By: Debbe Odea M.D.   On: 03/16/2023 08:30       Assessment & Plan:  Health care maintenance Assessment & Plan: Physical today 03/18/23.  Colonoscopy 06/15/21 - The examined portion of the ileum  was normal. Small lipoma in the ascending colon. Diverticulosis in the sigmoid colon and in the descending colon. Internal hemorrhoids. Check psa today.    Hypercholesterolemia -     Lipid panel -     Hepatic function panel  Hyperglycemia -     Hemoglobin A1c  Hypertension, essential  Paroxysmal atrial fibrillation (HCC)  Prostate cancer screening -     PSA, Medicare  Thyroid nodule -     TSH     Dale Clawson, MD

## 2023-03-18 NOTE — Assessment & Plan Note (Signed)
Physical today 03/18/23.  Colonoscopy 06/15/21 - The examined portion of the ileum was normal. Small lipoma in the ascending colon. Diverticulosis in the sigmoid colon and in the descending colon. Internal hemorrhoids. Check psa today.

## 2023-03-20 ENCOUNTER — Encounter: Payer: Self-pay | Admitting: Internal Medicine

## 2023-03-20 NOTE — Assessment & Plan Note (Signed)
Evaluated [facility] 10/2022  -  His noninvasive studies today demonstrate stable sizes with right common iliac artery measuring 1.4 cm and left common iliac artery measuring 1.6 cm. The abdominal aorta has a maximal diameter of 3 cm. 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 No role for intervention at this size. This can continue to be checked every other year.

## 2023-03-20 NOTE — Assessment & Plan Note (Signed)
Continue risk factor modification.  Stable.

## 2023-03-20 NOTE — Assessment & Plan Note (Signed)
.   Had f/u with Dr Lalla Brothers 01/24/23 - f/u afib. Is s/p AF/AFL ablation 03/2022. Continues on eliquis and metoprolol. Recommended catheter ablation - scheduled for 04/11/23.

## 2023-03-20 NOTE — Assessment & Plan Note (Signed)
Continue lipitor  ?

## 2023-03-20 NOTE — Assessment & Plan Note (Signed)
Diagnosed with sleep apnea. Saw Dr Mayford Knife 02/20/23 - CPAP 8. Just started using cpap. Trying to tolerate.

## 2023-03-20 NOTE — Assessment & Plan Note (Signed)
cystoscopy 06/10/22 - no evidence of recurrence.  F/u cystoscopy in 6 months.

## 2023-03-20 NOTE — Assessment & Plan Note (Signed)
Blood pressure is doing well on no medication.  Follow pressures.  Follow metabolic panel.  

## 2023-03-20 NOTE — Assessment & Plan Note (Signed)
On lipitor.  Low cholesterol diet and exercise.  Follow lipid panel and liver function tests.   

## 2023-03-20 NOTE — Assessment & Plan Note (Signed)
Worked up previously by Dr Paul.  Previous biopsy negative.  Last check stable.  Recommended f/u prn.  Follow thyroid function tests.  

## 2023-03-20 NOTE — Assessment & Plan Note (Signed)
Low carb diet and exercise.  Follow met b and a1c.   

## 2023-03-22 DIAGNOSIS — G4733 Obstructive sleep apnea (adult) (pediatric): Secondary | ICD-10-CM | POA: Diagnosis not present

## 2023-03-22 DIAGNOSIS — I10 Essential (primary) hypertension: Secondary | ICD-10-CM | POA: Diagnosis not present

## 2023-04-06 DIAGNOSIS — G4733 Obstructive sleep apnea (adult) (pediatric): Secondary | ICD-10-CM | POA: Diagnosis not present

## 2023-04-06 DIAGNOSIS — I10 Essential (primary) hypertension: Secondary | ICD-10-CM | POA: Diagnosis not present

## 2023-04-08 DIAGNOSIS — H43813 Vitreous degeneration, bilateral: Secondary | ICD-10-CM | POA: Diagnosis not present

## 2023-04-08 DIAGNOSIS — M3501 Sicca syndrome with keratoconjunctivitis: Secondary | ICD-10-CM | POA: Diagnosis not present

## 2023-04-08 DIAGNOSIS — H02403 Unspecified ptosis of bilateral eyelids: Secondary | ICD-10-CM | POA: Diagnosis not present

## 2023-04-08 DIAGNOSIS — H02006 Unspecified entropion of left eye, unspecified eyelid: Secondary | ICD-10-CM | POA: Diagnosis not present

## 2023-04-08 NOTE — Pre-Procedure Instructions (Signed)
Instructed patient on the following items: Arrival time 0800 Nothing to eat or drink after midnight No meds AM of procedure Responsible person to drive you home and stay with you for 24 hrs  Have you missed any doses of anti-coagulant Eliquis- takes twice a day, hasn't missed any doses.  Don't take dose on Monday morning.

## 2023-04-09 NOTE — Anesthesia Preprocedure Evaluation (Signed)
Anesthesia Evaluation  Patient identified by MRN, date of birth, ID band Patient awake    Reviewed: Allergy & Precautions, NPO status , Patient's Chart, lab work & pertinent test results  History of Anesthesia Complications Negative for: history of anesthetic complications  Airway Mallampati: III  TM Distance: >3 FB Neck ROM: Full    Dental no notable dental hx. (+) Dental Advisory Given   Pulmonary sleep apnea , former smoker   Pulmonary exam normal        Cardiovascular hypertension, + CAD  + dysrhythmias Atrial Fibrillation  Rhythm:Irregular Rate:Normal  IMPRESSIONS     1. Left ventricular ejection fraction, by estimation, is 55 to 60%. The  left ventricle has normal function. The left ventricle has no regional  wall motion abnormalities. Left ventricular diastolic parameters were  normal. The average left ventricular  global longitudinal strain is -19.2 %. The global longitudinal strain is  normal.   2. Right ventricular systolic function is normal. The right ventricular  size is normal. There is normal pulmonary artery systolic pressure.   3. Left atrial size was moderately dilated.   4. The mitral valve is normal in structure. Mild mitral valve  regurgitation. No evidence of mitral stenosis.   5. The aortic valve is normal in structure. Aortic valve regurgitation is  not visualized. No aortic stenosis is present.   6. The inferior vena cava is dilated in size with >50% respiratory  variability, suggesting right atrial pressure of 8 mmHg.      Neuro/Psych negative neurological ROS     GI/Hepatic Neg liver ROS,GERD  Medicated,,  Endo/Other  negative endocrine ROS    Renal/GU negative Renal ROS     Musculoskeletal  (+) Arthritis , Osteoarthritis,    Abdominal   Peds  Hematology negative hematology ROS (+)   Anesthesia Other Findings   Reproductive/Obstetrics                               Anesthesia Physical Anesthesia Plan  ASA: 3  Anesthesia Plan: General   Post-op Pain Management: Tylenol PO (pre-op)* and Celebrex PO (pre-op)*   Induction: Intravenous  PONV Risk Score and Plan: 2 and Ondansetron, Dexamethasone, Midazolam and Treatment may vary due to age or medical condition  Airway Management Planned: Oral ETT  Additional Equipment: None  Intra-op Plan:   Post-operative Plan: Extubation in OR  Informed Consent: I have reviewed the patients History and Physical, chart, labs and discussed the procedure including the risks, benefits and alternatives for the proposed anesthesia with the patient or authorized representative who has indicated his/her understanding and acceptance.     Dental advisory given  Plan Discussed with: Anesthesiologist and CRNA  Anesthesia Plan Comments:          Anesthesia Quick Evaluation

## 2023-04-11 ENCOUNTER — Ambulatory Visit (HOSPITAL_COMMUNITY)
Admission: RE | Admit: 2023-04-11 | Discharge: 2023-04-11 | Disposition: A | Discharge status: Home / Self Care | Payer: Medicare Other | Source: Ambulatory Visit | Attending: Cardiology | Admitting: Cardiology

## 2023-04-11 ENCOUNTER — Ambulatory Visit (HOSPITAL_COMMUNITY)
Admission: RE | Disposition: A | Discharge status: Home / Self Care | Payer: Medicare Other | Source: Ambulatory Visit | Attending: Cardiology

## 2023-04-11 ENCOUNTER — Other Ambulatory Visit: Payer: Self-pay

## 2023-04-11 ENCOUNTER — Ambulatory Visit (HOSPITAL_COMMUNITY): Payer: Self-pay | Admitting: Anesthesiology

## 2023-04-11 ENCOUNTER — Other Ambulatory Visit (HOSPITAL_COMMUNITY): Payer: Self-pay

## 2023-04-11 DIAGNOSIS — I483 Typical atrial flutter: Secondary | ICD-10-CM | POA: Insufficient documentation

## 2023-04-11 DIAGNOSIS — Z79899 Other long term (current) drug therapy: Secondary | ICD-10-CM | POA: Diagnosis not present

## 2023-04-11 DIAGNOSIS — G4730 Sleep apnea, unspecified: Secondary | ICD-10-CM | POA: Diagnosis not present

## 2023-04-11 DIAGNOSIS — I480 Paroxysmal atrial fibrillation: Secondary | ICD-10-CM | POA: Insufficient documentation

## 2023-04-11 DIAGNOSIS — Z7901 Long term (current) use of anticoagulants: Secondary | ICD-10-CM | POA: Diagnosis not present

## 2023-04-11 DIAGNOSIS — I2510 Atherosclerotic heart disease of native coronary artery without angina pectoris: Secondary | ICD-10-CM | POA: Diagnosis not present

## 2023-04-11 DIAGNOSIS — E785 Hyperlipidemia, unspecified: Secondary | ICD-10-CM | POA: Diagnosis not present

## 2023-04-11 DIAGNOSIS — I10 Essential (primary) hypertension: Secondary | ICD-10-CM | POA: Insufficient documentation

## 2023-04-11 DIAGNOSIS — G4733 Obstructive sleep apnea (adult) (pediatric): Secondary | ICD-10-CM | POA: Diagnosis not present

## 2023-04-11 DIAGNOSIS — K219 Gastro-esophageal reflux disease without esophagitis: Secondary | ICD-10-CM | POA: Diagnosis not present

## 2023-04-11 DIAGNOSIS — Z87891 Personal history of nicotine dependence: Secondary | ICD-10-CM | POA: Diagnosis not present

## 2023-04-11 DIAGNOSIS — I4819 Other persistent atrial fibrillation: Secondary | ICD-10-CM

## 2023-04-11 DIAGNOSIS — I4891 Unspecified atrial fibrillation: Secondary | ICD-10-CM | POA: Diagnosis not present

## 2023-04-11 HISTORY — PX: ATRIAL FIBRILLATION ABLATION: EP1191

## 2023-04-11 LAB — POCT ACTIVATED CLOTTING TIME: Activated Clotting Time: 262 s

## 2023-04-11 SURGERY — ATRIAL FIBRILLATION ABLATION
Anesthesia: General

## 2023-04-11 MED ORDER — SODIUM CHLORIDE 0.9% FLUSH
3.0000 mL | Freq: Two times a day (BID) | INTRAVENOUS | Status: DC
Start: 2023-04-11 — End: 2023-04-11

## 2023-04-11 MED ORDER — ONDANSETRON HCL 4 MG/2ML IJ SOLN: 4.0000 mg | Freq: Once | INTRAMUSCULAR | Status: DC | PRN

## 2023-04-11 MED ORDER — ACETAMINOPHEN 160 MG/5ML PO SOLN: 325.0000 mg | ORAL | Status: DC | PRN

## 2023-04-11 MED ORDER — MIDAZOLAM HCL 5 MG/5ML IJ SOLN
INTRAMUSCULAR | Status: AC
  Filled 2023-04-11: qty 5

## 2023-04-11 MED ORDER — FENTANYL CITRATE (PF) 250 MCG/5ML IJ SOLN
INTRAMUSCULAR | Status: DC | PRN
  Administered 2023-04-11: 50 ug via INTRAVENOUS

## 2023-04-11 MED ORDER — FENTANYL CITRATE (PF) 100 MCG/2ML IJ SOLN
INTRAMUSCULAR | Status: AC
  Filled 2023-04-11: qty 2

## 2023-04-11 MED ORDER — OXYCODONE HCL 5 MG PO TABS: 5.0000 mg | ORAL_TABLET | Freq: Once | ORAL | Status: DC | PRN

## 2023-04-11 MED ORDER — COLCHICINE 0.6 MG PO TABS
0.6000 mg | ORAL_TABLET | Freq: Two times a day (BID) | ORAL | 0 refills | Status: DC
  Filled 2023-04-11: qty 10, 5d supply, fill #0

## 2023-04-11 MED ORDER — ROCURONIUM BROMIDE 100 MG/10ML IV SOLN
INTRAVENOUS | Status: DC | PRN
  Administered 2023-04-11: 40 mg via INTRAVENOUS

## 2023-04-11 MED ORDER — PHENYLEPHRINE HCL-NACL 20-0.9 MG/250ML-% IV SOLN
INTRAVENOUS | Status: DC | PRN
  Administered 2023-04-11: 40 ug/min via INTRAVENOUS

## 2023-04-11 MED ORDER — LIDOCAINE 2% (20 MG/ML) 5 ML SYRINGE
INTRAMUSCULAR | Status: DC | PRN
  Administered 2023-04-11: 80 mg via INTRAVENOUS

## 2023-04-11 MED ORDER — SODIUM CHLORIDE 0.9 % IV SOLN: 250.0000 mL | INTRAVENOUS | Status: DC | PRN

## 2023-04-11 MED ORDER — FENTANYL CITRATE (PF) 100 MCG/2ML IJ SOLN: 25.0000 ug | INTRAMUSCULAR | Status: DC | PRN

## 2023-04-11 MED ORDER — APIXABAN 5 MG PO TABS
5.0000 mg | ORAL_TABLET | Freq: Two times a day (BID) | ORAL | Status: DC
  Administered 2023-04-11: 5 mg via ORAL
  Filled 2023-04-11: qty 1

## 2023-04-11 MED ORDER — ACETAMINOPHEN 325 MG PO TABS: 325.0000 mg | ORAL_TABLET | ORAL | Status: DC | PRN

## 2023-04-11 MED ORDER — SODIUM CHLORIDE 0.9 % IV SOLN: INTRAVENOUS | Status: DC

## 2023-04-11 MED ORDER — SUGAMMADEX SODIUM 200 MG/2ML IV SOLN
INTRAVENOUS | Status: DC | PRN
  Administered 2023-04-11: 200 mg via INTRAVENOUS

## 2023-04-11 MED ORDER — ONDANSETRON HCL 4 MG/2ML IJ SOLN: 4.0000 mg | Freq: Four times a day (QID) | INTRAMUSCULAR | Status: DC | PRN

## 2023-04-11 MED ORDER — PANTOPRAZOLE SODIUM 40 MG PO TBEC
40.0000 mg | DELAYED_RELEASE_TABLET | Freq: Every day | ORAL | Status: DC
  Administered 2023-04-11: 40 mg via ORAL
  Filled 2023-04-11: qty 1

## 2023-04-11 MED ORDER — HEPARIN SODIUM (PORCINE) 1000 UNIT/ML IJ SOLN
INTRAMUSCULAR | Status: DC | PRN
  Administered 2023-04-11: 5000 [IU] via INTRAVENOUS
  Administered 2023-04-11: 13000 [IU] via INTRAVENOUS

## 2023-04-11 MED ORDER — PANTOPRAZOLE SODIUM 40 MG PO TBEC
40.0000 mg | DELAYED_RELEASE_TABLET | Freq: Every day | ORAL | 0 refills | Status: AC
  Filled 2023-04-11: qty 45, 45d supply, fill #0

## 2023-04-11 MED ORDER — MIDAZOLAM HCL 2 MG/2ML IJ SOLN
INTRAMUSCULAR | Status: DC | PRN
  Administered 2023-04-11: 2 mg via INTRAVENOUS
  Administered 2023-04-11: 3 mg via INTRAVENOUS

## 2023-04-11 MED ORDER — ACETAMINOPHEN 500 MG PO TABS
1000.0000 mg | ORAL_TABLET | Freq: Once | ORAL | Status: AC
  Administered 2023-04-11: 1000 mg via ORAL
  Filled 2023-04-11: qty 2

## 2023-04-11 MED ORDER — SODIUM CHLORIDE 0.9% FLUSH: 3.0000 mL | INTRAVENOUS | Status: DC | PRN

## 2023-04-11 MED ORDER — ATROPINE SULFATE 1 MG/ML IV SOLN
INTRAVENOUS | Status: DC | PRN
  Administered 2023-04-11: 1 mg via INTRAVENOUS

## 2023-04-11 MED ORDER — CELECOXIB 200 MG PO CAPS
200.0000 mg | ORAL_CAPSULE | Freq: Once | ORAL | Status: AC
Start: 2023-04-11 — End: 2023-04-11
  Administered 2023-04-11: 200 mg via ORAL
  Filled 2023-04-11 (×2): qty 1

## 2023-04-11 MED ORDER — ONDANSETRON HCL 4 MG/2ML IJ SOLN
INTRAMUSCULAR | Status: DC | PRN
  Administered 2023-04-11: 4 mg via INTRAVENOUS

## 2023-04-11 MED ORDER — DEXAMETHASONE SODIUM PHOSPHATE 10 MG/ML IJ SOLN
INTRAMUSCULAR | Status: DC | PRN
  Administered 2023-04-11: 10 mg via INTRAVENOUS

## 2023-04-11 MED ORDER — COLCHICINE 0.6 MG PO TABS
0.6000 mg | ORAL_TABLET | Freq: Two times a day (BID) | ORAL | Status: DC
  Administered 2023-04-11: 0.6 mg via ORAL
  Filled 2023-04-11: qty 1

## 2023-04-11 MED ORDER — PROTAMINE SULFATE 10 MG/ML IV SOLN
INTRAVENOUS | Status: DC | PRN
  Administered 2023-04-11: 30 mg via INTRAVENOUS

## 2023-04-11 MED ORDER — OXYCODONE HCL 5 MG/5ML PO SOLN: 5.0000 mg | Freq: Once | ORAL | Status: DC | PRN

## 2023-04-11 MED ORDER — HEPARIN (PORCINE) IN NACL 1000-0.9 UT/500ML-% IV SOLN
INTRAVENOUS | Status: DC | PRN
  Administered 2023-04-11: 500 mL

## 2023-04-11 MED ORDER — ACETAMINOPHEN 325 MG PO TABS: 650.0000 mg | ORAL_TABLET | ORAL | Status: DC | PRN

## 2023-04-11 MED ORDER — PROPOFOL 10 MG/ML IV BOLUS
INTRAVENOUS | Status: DC | PRN
  Administered 2023-04-11: 100 mg via INTRAVENOUS

## 2023-04-11 MED ORDER — MEPERIDINE HCL 25 MG/ML IJ SOLN: 6.2500 mg | INTRAMUSCULAR | Status: DC | PRN

## 2023-04-11 SURGICAL SUPPLY — 20 items
BAG SNAP BAND KOVER 36X36 (MISCELLANEOUS) IMPLANT
BLANKET WARM UNDERBOD FULL ACC (MISCELLANEOUS) ×1 IMPLANT
CABLE PFA RX CATH CONN (CABLE) IMPLANT
CATH FARAWAVE ABLATION 31 (CATHETERS) IMPLANT
CATH OCTARAY 2.0 F 3-3-3-3-3 (CATHETERS) IMPLANT
CATH SOUNDSTAR ECO 8FR (CATHETERS) IMPLANT
CATH WEBSTER BI DIR CS D-F CRV (CATHETERS) IMPLANT
CLOSURE PERCLOSE PROSTYLE (VASCULAR PRODUCTS) IMPLANT
COVER SWIFTLINK CONNECTOR (BAG) ×1 IMPLANT
DILATOR VESSEL 38 20CM 16FR (INTRODUCER) IMPLANT
GUIDEWIRE INQWIRE 1.5J.035X260 (WIRE) IMPLANT
INQWIRE 1.5J .035X260CM (WIRE) ×1 IMPLANT
KIT VERSACROSS CNCT FARADRIVE (KITS) IMPLANT
PACK EP LF (CUSTOM PROCEDURE TRAY) ×1 IMPLANT
PAD DEFIB RADIO PHYSIO CONN (PAD) ×1 IMPLANT
PATCH CARTO3 (PAD) IMPLANT
SHEATH FARADRIVE STEERABLE (SHEATH) IMPLANT
SHEATH PINNACLE 8F 10CM (SHEATH) IMPLANT
SHEATH PINNACLE 9F 10CM (SHEATH) IMPLANT
SHEATH PROBE COVER 6X72 (BAG) IMPLANT

## 2023-04-11 NOTE — Anesthesia Procedure Notes (Addendum)
Procedure Name: Intubation Date/Time: 04/11/2023 10:19 AM  Performed by: Bethena Midget, MDPre-anesthesia Checklist: Patient identified, Emergency Drugs available, Suction available and Patient being monitored Patient Re-evaluated:Patient Re-evaluated prior to induction Oxygen Delivery Method: Circle system utilized Preoxygenation: Pre-oxygenation with 100% oxygen Induction Type: IV induction Ventilation: Mask ventilation without difficulty Laryngoscope Size: 3 and Mac Grade View: Grade I Tube type: Oral Tube size: 8.0 mm Number of attempts: 1 Airway Equipment and Method: Stylet and Oral airway Placement Confirmation: ETT inserted through vocal cords under direct vision, positive ETCO2 and breath sounds checked- equal and bilateral Tube secured with: Tape Dental Injury: Teeth and Oropharynx as per pre-operative assessment

## 2023-04-11 NOTE — H&P (Signed)
Electrophysiology Office Follow up Visit Note:     Date:  04/11/2023    ID:  Nathan, Watts [birth year], MRN [id number]   PCP:  Dale Goodville, MD      [name] Cardiologist:  None  [facility] Electrophysiologist:  Lanier Prude, MD      Interval History:     Nathan Watts is a 77 y.o. male who presents for a follow up visit.    Last seen 11/26/2022 by Luella Cook. Had AF/AFL ablation 03/2022. At the appt with Luella Cook he reported several episodes of AF. A monitor was ordered.    He has been doing well overall.  Prior to his catheter ablation last year, he experienced atrial fibrillation on a weekly basis.  After the ablation episodes have been rare.  He did have an episode in August of this year that lasted almost an hour and was severe.  It led to an ER presentation.  He has had a few shorter episodes as well, no other episodes leading to hospitalization.   He has had a sleep study that showed mild sleep apnea.  He has been referred for titration study.   Presents for redo AF/AFL ablation today.  Objective Past medical, surgical, social and family history were reviewed.   ROS:   Please see the history of present illness.    All other systems reviewed and are negative.   EKGs/Labs/Other Studies Reviewed:     The following studies were reviewed today:   12/23/2022 Zio personally reviewed HR 36 - 200, average 58 bpm. 2 nonsustained VT, longest 4 beats. 38 nonsustained SVT, longest 13.1 seconds with an average rate of 126 bpm. Rare supraventricular and ventricular ectopy. No atrial fibrillation. No sustained arrhythmias.         Physical Exam:     VS:  BP 125/74   Pulse 54   Ht 5\' 11"  (1.803 m)   Wt 182 lb (82.6 kg)   SpO2 98%   BMI 25.38 kg/m         Wt Readings from Last 3 Encounters:  01/24/23 182 lb (82.6 kg)  12/09/22 170 lb (77.1 kg)  11/26/22 183 lb 6 oz (83.2 kg)      GEN:  Well nourished, well developed in no acute  distress CARDIAC: RRR, no murmurs, rubs, gallops RESPIRATORY:  Clear to auscultation without rales, wheezing or rhonchi      Assessment ASSESSMENT:     1. Paroxysmal atrial fibrillation (HCC)   2. Typical atrial flutter (HCC)   3. Primary hypertension   4. OSA (obstructive sleep apnea)     PLAN:     In order of problems listed above:   #Hx of AF S/p AF/AFL ablation 03/2022 Recent symptomatic recurrence with dyspnea and chest pressure Cont eliquis Cont metoprolol   Discussed treatment options including redo catheter ablation, AAD.   Discussed treatment options today for AF including antiarrhythmic drug therapy and ablation. Discussed risks, recovery and likelihood of success with each treatment strategy. Risk, benefits, and alternatives to EP study and ablation for afib were discussed. These risks include but are not limited to stroke, bleeding, vascular damage, tamponade, perforation, damage to the esophagus, lungs, phrenic nerve and other structures, pulmonary vein stenosis, worsening renal function, coronary vasospasm and death.  Discussed potential need for repeat ablation procedures and antiarrhythmic drugs after an initial ablation. The patient understands these risk and wishes to proceed.  We will therefore proceed with catheter ablation at the next available time.  Carto, ICE, anesthesia are requested for the procedure.  Will also obtain CT PV protocol prior to the procedure to exclude LAA thrombus and further evaluate atrial anatomy.   #HLD Cont atorvastatin   #Sleep apnea We will send a message to the sleep clinic regarding follow-up.      Presents for redo AF/AFL ablation. Procedure reviewed.       Signed, Steffanie Dunn, MD, [facility], [facility] 04/11/2023 Electrophysiology [facility]

## 2023-04-11 NOTE — Anesthesia Postprocedure Evaluation (Signed)
Anesthesia Post Note  Patient: Nathan Watts  Procedure(s) Performed: ATRIAL FIBRILLATION ABLATION     Patient location during evaluation: PACU Anesthesia Type: General Level of consciousness: awake and alert Pain management: pain level controlled Vital Signs Assessment: post-procedure vital signs reviewed and stable Respiratory status: spontaneous breathing, nonlabored ventilation, respiratory function stable and patient connected to nasal cannula oxygen Cardiovascular status: blood pressure returned to baseline and stable Postop Assessment: no apparent nausea or vomiting Anesthetic complications: no   There were no known notable events for this encounter.  Last Vitals:  Vitals:   04/11/23 0815 04/11/23 1132  BP: 125/74 (!) 119/93  Pulse: (!) 54 71  Resp: 17 18  Temp: 36.8 C (!) 35 C  SpO2: 96% 100%    Last Pain:  Vitals:   04/11/23 1132  TempSrc: Oral  PainSc: 0-No pain                 Sherry Blackard

## 2023-04-11 NOTE — Discharge Instructions (Signed)

## 2023-04-11 NOTE — Progress Notes (Signed)
Patient and daughter was given discharge instructions. Both verbalized understanding. 

## 2023-04-11 NOTE — Transfer of Care (Signed)
Immediate Anesthesia Transfer of Care Note  Patient: Nathan Watts  Procedure(s) Performed: ATRIAL FIBRILLATION ABLATION  Patient Location: PACU  Anesthesia Type:General  Level of Consciousness: awake  Airway & Oxygen Therapy: Patient Spontanous Breathing  Post-op Assessment: Report given to RN  Post vital signs: Reviewed and stable  Last Vitals:  Vitals Value Taken Time  BP 119/93 04/11/23 1132  Temp 35 C 04/11/23 1132  Pulse 67 04/11/23 1139  Resp 18 04/11/23 1139  SpO2 97 % 04/11/23 1139  Vitals shown include unfiled device data.  Last Pain:  Vitals:   04/11/23 1132  TempSrc: Oral  PainSc: 0-No pain         Complications: There were no known notable events for this encounter.

## 2023-04-12 ENCOUNTER — Encounter (HOSPITAL_COMMUNITY): Payer: Self-pay | Admitting: Cardiology

## 2023-04-12 ENCOUNTER — Telehealth: Payer: Self-pay | Admitting: Cardiology

## 2023-04-12 ENCOUNTER — Encounter: Payer: Self-pay | Admitting: Cardiology

## 2023-04-12 MED FILL — Midazolam HCl Inj 5 MG/5ML (Base Equivalent): INTRAMUSCULAR | Qty: 5 | Status: AC

## 2023-04-12 MED FILL — Fentanyl Citrate Preservative Free (PF) Inj 100 MCG/2ML: INTRAMUSCULAR | Qty: 1 | Status: AC

## 2023-04-12 NOTE — Telephone Encounter (Signed)
 Spoke with the patient who states that the gauze covering his insertion site on the left side was soaked in blood yesterday. He states that the right side only has a small amount of blood on it. He denies any current bleeding. Advised patient he can go ahead and take off the square bandages. Advised to lie down and hold pressure if bleeding reoccurs. Advised on monitoring site for any concerning signs or symptoms.

## 2023-04-12 NOTE — Telephone Encounter (Signed)
See phone note

## 2023-04-12 NOTE — Telephone Encounter (Signed)
Patient states he was advised to call if he experienced any bleeding after 12/30 ablation. He states yesterday he had significant bleeding along his left side. He mentions that it appears to have stopped today, but he still wanted to inform Dr. Lalla Brothers.

## 2023-05-07 DIAGNOSIS — I10 Essential (primary) hypertension: Secondary | ICD-10-CM | POA: Diagnosis not present

## 2023-05-07 DIAGNOSIS — G4733 Obstructive sleep apnea (adult) (pediatric): Secondary | ICD-10-CM | POA: Diagnosis not present

## 2023-05-08 NOTE — Progress Notes (Unsigned)
Electrophysiology Clinic Note    Date:  05/09/2023  Patient ID:  Nathan Watts, Nathan Watts 1946/02/17, MRN [id number] PCP:  Dale [place], MD  Cardiologist:  None Electrophysiologist: Lanier Prude, MD   Discussed the use of AI scribe software for clinical note transcription with the patient, who gave verbal consent to proceed.   Patient Profile    Chief Complaint: AF ablation follow-up  History of Present Illness: Nathan Watts is a 78 y.o. male with PMH notable for parox AFib, aflutter, HTN, aortic atherosclerosis, ascending aorta aneurysm, OSA; seen today for Lanier Prude, MD for routine electrophysiology followup.  He is s/p AFib, aflutter ablation w PVI and CTI on 03/2022 He is s/p redo AFib ablation with PVI and posterior wall on 03/2023 by Dr. Lalla Brothers. He was recently diagnosed with mild OSA, CPAP recommended given h/o AFib. Has follow-up scheduled with Dr. Mayford Knife in Feb.   On follow-up today, he is overall doing well. He has intermittent palpitations at night when gets up to use bathroom. He checks his pulse using watch, and pulse is normal. Has not noticed any palpitations during the day.  He has struggled using CPAP, but is slowly increasing the duration and is now able to use it all night. He does have nasal dryness, no nose bleeds. Continues to take eliquis BID, no missed doses, no bleeding concerns.  Groin sites well-healed. Initially had significant bruising on L groin   AAD History: None     ROS:  Please see the history of present illness. All other systems are reviewed and otherwise negative.    Physical Exam    VS:  BP 132/74 (BP Location: Left Arm, Patient Position: Sitting)   Pulse (!) 58   Ht 5\' 11"  (1.803 m)   Wt 186 lb 12.8 oz (84.7 kg)   SpO2 97%   BMI 26.05 kg/m  BMI: Body mass index is 26.05 kg/m.  Wt Readings from Last 3 Encounters:  05/09/23 186 lb 12.8 oz (84.7 kg)  04/11/23 175 lb (79.4 kg)  03/18/23 179 lb 12.8 oz (81.6  kg)     GEN- The patient is well appearing, alert and oriented x 3 today.   Lungs- Clear to ausculation bilaterally, normal work of breathing.  Heart- Regular rate and rhythm, no murmurs, rubs or gallops Extremities- No peripheral edema, warm, dry   Studies Reviewed   Previous EP, cardiology notes.    EKG is ordered. Personal review of EKG from today shows:   EKG Interpretation Date/Time:  Monday May 09 2023 14:41:29 EST Ventricular Rate:  62 PR Interval:  214 QRS Duration:  92 QT Interval:  414 QTC Calculation: 420 R Axis:   22  Text Interpretation: Sinus rhythm with 1st degree A-V block with Premature atrial complexes in a pattern of bigeminy Confirmed by Sherie Don [id number]) on 05/09/2023 2:46:02 PM    Chest CT, 03/15/2023 (over-read) 1. Aneurysmal dilatation of the ascending aorta, 4.2 cm, previously 4.1 cm. Recommend annual imaging followup by CTA or MRA. This recommendation follows 2010 ACCF/AHA/AATS/ACR/ASA/SCA/SCAI/SIR/STS/SVM Guidelines for the Diagnosis and Management of Patients with Thoracic Aortic Disease. Circulation. 2010; 121: J191-Y782. Aortic aneurysm NOS (ICD10-I71.9) 2. Minimal hiatal hernia.  Long term monitor, 12/27/2022 2 nonsustained VT, longest 4 beats. 38 nonsustained SVT, longest 13.1 seconds with an average rate of 126 bpm. Rare supraventricular and ventricular ectopy. No atrial fibrillation. No sustained arrhythmias.  TTE, 01/11/2022  1. Left ventricular ejection fraction, by estimation, is 55 to 60%. The  left ventricle has normal function. The left ventricle has no regional wall motion abnormalities. Left ventricular diastolic parameters were normal. The average left ventricular global longitudinal strain is -19.2 %. The global longitudinal strain is normal.   2. Right ventricular systolic function is normal. The right ventricular size is normal. There is normal pulmonary artery systolic pressure.   3. Left atrial size was moderately dilated.    4. The mitral valve is normal in structure. Mild mitral valve regurgitation. No evidence of mitral stenosis.   5. The aortic valve is normal in structure. Aortic valve regurgitation is not visualized. No aortic stenosis is present.   6. The inferior vena cava is dilated in size with >50% respiratory variability, suggesting right atrial pressure of 8 mmHg.    Assessment and Plan     #) parox AFib #) aflutter S/p AF, flutter ablation 03/2022 S/p redo AF ablation 03/2023 Maintaining sinus rhythm since most recent procedure  #) Hypercoag d/t parox afib CHA2DS2-VASc Score = at least 4 [CHF History: 0, HTN History: 1, Diabetes History: 0, Stroke History: 0, Vascular Disease History: 1, Age Score: 2, Gender Score: 0].  Therefore, the patient's annual risk of stroke is 4.8 %.    Stroke ppx - 5mg  eliquis BID, appropriately dosed No bleeding concerns  #) OSA Congratulated on persistence with using CPAP Encouraged ongoing nightly use Has follow-up soon with Dr. Mayford Knife       Current medicines are reviewed at length with the patient today.   The patient does not have concerns regarding his medicines.  The following changes were made today:  none  Labs/ tests ordered today include:  Orders Placed This Encounter  Procedures   EKG 12-Lead     Disposition: Follow up with Dr. Lalla Brothers or EP APP  2 months    Signed, Sherie Don, NP  05/09/23  3:18 PM  Electrophysiology [facility]

## 2023-05-09 ENCOUNTER — Ambulatory Visit: Payer: Medicare Other | Attending: Cardiology | Admitting: Cardiology

## 2023-05-09 VITALS — BP 132/74 | HR 58 | Ht 71.0 in | Wt 186.8 lb

## 2023-05-09 DIAGNOSIS — I483 Typical atrial flutter: Secondary | ICD-10-CM

## 2023-05-09 DIAGNOSIS — I480 Paroxysmal atrial fibrillation: Secondary | ICD-10-CM

## 2023-05-09 DIAGNOSIS — G4733 Obstructive sleep apnea (adult) (pediatric): Secondary | ICD-10-CM

## 2023-05-09 DIAGNOSIS — D6869 Other thrombophilia: Secondary | ICD-10-CM | POA: Diagnosis not present

## 2023-05-09 NOTE — Patient Instructions (Signed)
Medication Instructions:  The current medical regimen is effective;  continue present plan and medications.  *If you need a refill on your cardiac medications before your next appointment, please call your pharmacy*   Follow-Up: At [facility], you and your health needs are our priority.  As part of our continuing mission to provide you with exceptional heart care, we have created designated Provider Care Teams.  These Care Teams include your primary Cardiologist (physician) and Advanced Practice Providers (APPs -  Physician Assistants and Nurse Practitioners) who all work together to provide you with the care you need, when you need it.  We recommend signing up for the patient portal called "MyChart".  Sign up information is provided on this After Visit Summary.  MyChart is used to connect with patients for Virtual Visits (Telemedicine).  Patients are able to view lab/test results, encounter notes, upcoming appointments, etc.  Non-urgent messages can be sent to your provider as well.   To learn more about what you can do with MyChart, go to [email or web address].    Your next appointment:   Keep scheduled appointments

## 2023-05-10 ENCOUNTER — Encounter: Payer: Self-pay | Admitting: Internal Medicine

## 2023-05-10 DIAGNOSIS — I77819 Aortic ectasia, unspecified site: Secondary | ICD-10-CM | POA: Insufficient documentation

## 2023-05-25 ENCOUNTER — Encounter: Payer: Self-pay | Admitting: Cardiology

## 2023-05-25 ENCOUNTER — Ambulatory Visit: Payer: Medicare Other | Attending: Cardiology | Admitting: Cardiology

## 2023-05-25 VITALS — BP 118/66 | HR 61 | Ht 71.0 in | Wt 185.6 lb

## 2023-05-25 DIAGNOSIS — I10 Essential (primary) hypertension: Secondary | ICD-10-CM

## 2023-05-25 DIAGNOSIS — G4733 Obstructive sleep apnea (adult) (pediatric): Secondary | ICD-10-CM | POA: Diagnosis not present

## 2023-05-25 NOTE — Progress Notes (Signed)
Order for Nathan Watts sent to [facility] today.

## 2023-05-25 NOTE — Patient Instructions (Signed)
Medication Instructions:   *If you need a refill on your cardiac medications before your next appointment, please call your pharmacy*   Lab Work:  If you have labs (blood work) drawn today and your tests are completely normal, you will receive your results only by: MyChart Message (if you have MyChart) OR A paper copy in the mail If you have any lab test that is abnormal or we need to change your treatment, we will call you to review the results.   Testing/Procedures:    Follow-Up: At [facility], you and your health needs are our priority.  As part of our continuing mission to provide you with exceptional heart care, we have created designated Provider Care Teams.  These Care Teams include your primary Cardiologist (physician) and Advanced Practice Providers (APPs -  Physician Assistants and Nurse Practitioners) who all work together to provide you with the care you need, when you need it.  We recommend signing up for the patient portal called "MyChart".  Sign up information is provided on this After Visit Summary.  MyChart is used to connect with patients for Virtual Visits (Telemedicine).  Patients are able to view lab/test results, encounter notes, upcoming appointments, etc.  Non-urgent messages can be sent to your provider as well.   To learn more about what you can do with MyChart, go to [email or web address].    Your next appointment:   1 year(s)  Provider:   DR Armanda Magic     Other Instructions

## 2023-05-25 NOTE — Progress Notes (Signed)
Sleep Medicine CONSULT Note    Date:  05/25/2023   ID:  Nathan Watts, Nathan Watts [birth year], MRN [id number]  PCP:  Dale Tetonia, MD  Electrophysiologist: Steffanie Dunn, MD  Chief Complaint  Patient presents with   New Patient (Initial Visit)    Obstructive sleep apnea    History of Present Illness:  Nathan Watts is a 78 y.o. male who is being seen today for the evaluation of OSA at the request of Steffanie Dunn, MD.  This is a 78 year old male with a history of paroxysmal atrial fibrillation, atrial flutter, hypertension status post A-fib/flutter ablation with PVI CTI 03/2022.  He was seen by Levy Sjogren, NP on 11/26/2022 and complained of feeling sleepy during the day as well as snoring.  Given his A-fib and a STOP-BANG score of 5 he was set up for home sleep study.   Home sleep study demonstrated mild obstructive sleep apnea with an AHI of 9.6 overall but moderate during REM sleep with a REM AHI of 17.5/h.  He also had O2 saturations as low as 81% and moderate snoring.  He underwent CPAP titration and was titrated to 8 cm H2O.  He is now referred for sleep medicine consultation to establish sleep care and treatment of obstructive sleep apnea  He  says that it took a while to get used to but he is doing well with his PAP device and thinks that he has gotten used to it.  He tolerates the nasal pillow mask and feels the pressure is adequate.  Since going on PAP he feels rested in the am and has no significant daytime sleepiness.  He denies any significant  nasal dryness or nasal congestion. He does have some problems with mouth dryness and does not use a chin strap.  He is sleeping longer that he used to.  He does not think that he snores.    Past Medical History:  Diagnosis Date   BPH (benign prostatic hyperplasia)    Fatty infiltration of liver    GERD (gastroesophageal reflux disease)    Hypercholesterolemia    Hyperglycemia    Hypertension    OSA on CPAP    mild  obstructive sleep apnea with an AHI of 9.6 overall but moderate during REM sleep with a REM AHI of 17.5/h.  On CPAP at 8cm H2O   Thrombocytopenia (HCC)    Thyroid nodule     Past Surgical History:  Procedure Laterality Date   ATRIAL FIBRILLATION ABLATION N/A 03/26/2022   Procedure: ATRIAL FIBRILLATION ABLATION;  Surgeon: Lanier Prude, MD;  Location: [facility];  Service: Cardiovascular;  Laterality: N/A;   ATRIAL FIBRILLATION ABLATION N/A 04/11/2023   Procedure: ATRIAL FIBRILLATION ABLATION;  Surgeon: Lanier Prude, MD;  Location: [facility];  Service: Cardiovascular;  Laterality: N/A;   CATARACT EXTRACTION W/PHACO Left 04/20/2022   Procedure: CATARACT EXTRACTION PHACO AND INTRAOCULAR LENS PLACEMENT (IOC) LEFT 13.73 01:12.4;  Surgeon: Galen Manila, MD;  Location: [facility];  Service: Ophthalmology;  Laterality: Left;   CATARACT EXTRACTION W/PHACO Right 05/04/2022   Procedure: CATARACT EXTRACTION PHACO AND INTRAOCULAR LENS PLACEMENT (IOC) RIGHT;  Surgeon: Galen Manila, MD;  Location: [facility];  Service: Ophthalmology;  Laterality: Right;  9.42 0:54.4   COLONOSCOPY WITH PROPOFOL N/A 10/02/2014   Procedure: COLONOSCOPY WITH PROPOFOL;  Surgeon: Scot Jun, MD;  Location: [facility] ENDOS[facility];  Service: Endoscopy;  Laterality: N/A;   COLONOSCOPY WITH PROPOFOL N/A 06/15/2021   Procedure:  COLONOSCOPY WITH PROPOFOL;  Surgeon: Regis Bill, MD;  Location: [facility] ENDOSCOPY;  Service: Endoscopy;  Laterality: N/A;   ESOPHAGOGASTRODUODENOSCOPY (EGD) WITH PROPOFOL N/A 06/15/2021   Procedure: ESOPHAGOGASTRODUODENOSCOPY (EGD) WITH PROPOFOL;  Surgeon: Regis Bill, MD;  Location: [facility] ENDOSCOPY;  Service: Endoscopy;  Laterality: N/A;   HOLEP-LASER ENUCLEATION OF THE PROSTATE WITH MORCELLATION N/A 04/24/2021   Procedure: HOLEP-LASER ENUCLEATION OF THE PROSTATE WITH MORCELLATION;  Surgeon: Sondra Come, MD;  Location: [facility] ORS;  Service: Urology;   Laterality: N/A;   INGUINAL HERNIA REPAIR     TONSILLECTOMY     TRANSURETHRAL RESECTION OF BLADDER TUMOR WITH MITOMYCIN-C N/A 04/24/2021   Procedure: 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   cyanocobalamin 1000 MCG tablet Take 1,000 mcg by mouth in the morning.   dextromethorphan-guaiFENesin (MUCINEX DM) 30-600 MG 12hr tablet Take 1 tablet by mouth 2 (two) times daily as needed (congestion).   ELIQUIS 5 MG TABS tablet TAKE ONE TABLET BY MOUTH TWICE DAILY   metroNIDAZOLE (METROGEL) 0.75 % gel Apply 1 application  topically 2 (two) times daily. to face   Multiple Vitamin (MULTIVITAMIN WITH MINERALS) TABS tablet Take 1 tablet by mouth in the morning.   Omega-3 Fatty Acids (FISH OIL) 1200 MG CAPS Take 1,200 mg by mouth in the morning.   [Paused] omeprazole (PRILOSEC OTC) 20 MG tablet Take 20 mg by mouth every evening.   pantoprazole (PROTONIX) 40 MG tablet Take 1 tablet (40 mg total) by mouth daily.   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   Probiotic Product (PROBIOTIC PO) Take 1 capsule by mouth in the morning.   Psyllium (METAMUCIL PO) Take 1 Scoop by mouth in the morning. Metamucil Fiber 1 scoop = teaspoonful    Allergies:   Crestor [rosuvastatin] and Hydrocodone-chlorpheniramine   Social History   Socioeconomic History   Marital status: Married    Spouse name: Peggy   Number of children: 4   Years of education: Not on file   Highest education level: Bachelor's degree (e.g., BA, AB, BS)  Occupational History   Occupation: retired Dentist  Tobacco Use   Smoking status: Former    Current packs/day: 0.00    Average packs/day: 1 pack/day for 30.0 years (30.0 ttl pk-yrs)    Types:  Cigarettes    Start date: 04/12/1968    Quit date: 04/12/1998    Years since quitting: 25.1    Passive exposure: Past   Smokeless tobacco: Never  Vaping Use   Vaping status: Never Used  Substance and Sexual Activity   Alcohol use: Yes    Alcohol/week: 2.0 standard drinks of alcohol    Types: 2 Cans of beer per week    Comment: 1-2 beers 1-2 days per week   Drug use: Never   Sexual activity: Never  Other Topics Concern   Not on file  Social History Narrative   He is married. Has four children   Social Drivers of Corporate investment banker Strain: Low Risk  (11/18/2022)   Overall Financial Resource Strain (CARDIA)    Difficulty of Paying Living Expenses: Not hard at all  Food Insecurity: No Food Insecurity (11/18/2022)   Hunger Vital Sign  Worried About Programme researcher, broadcasting/film/video in the Last Year: Never true    Ran Out of Food in the Last Year: Never true  Transportation Needs: No Transportation Needs (11/18/2022)   PRAPARE - Administrator, Civil Service (Medical): No    Lack of Transportation (Non-Medical): No  Physical Activity: Sufficiently Active (11/18/2022)   Exercise Vital Sign    Days of Exercise per Week: 6 days    Minutes of Exercise per Session: 90 min  Stress: Stress Concern Present (11/18/2022)   [employer] of Occupational Health - Occupational Stress Questionnaire    Feeling of Stress : To some extent  Social Connections: Socially Integrated (11/18/2022)   Social Connection and Isolation Panel [NHANES]    Frequency of Communication with Friends and Family: More than three times a week    Frequency of Social Gatherings with Friends and Family: Three times a week    Attends Religious Services: More than 4 times per year    Active Member of Clubs or Organizations: Yes    Attends Engineer, structural: More than 4 times per year    Marital Status: Married     Family History:  The patient's family history includes Heart disease in his father; Prostate  cancer in his father; Stroke in his mother.   ROS:   Please see the history of present illness.    ROS All other systems reviewed and are negative.      No data to display             PHYSICAL EXAM:   VS:  BP 118/66   Pulse 61   Ht 5\' 11"  (1.803 m)   Wt 185 lb 9.6 oz (84.2 kg)   SpO2 97%   BMI 25.89 kg/m    GEN: Well nourished, well developed, in no acute distress  HEENT: normal  Neck: no JVD, carotid bruits, or masses Cardiac: RRR; no murmurs, rubs, or gallops,no edema.  Intact distal pulses bilaterally.  Respiratory:  clear to auscultation bilaterally, normal work of breathing GI: soft, nontender, nondistended, + BS MS: no deformity or atrophy  Skin: warm and dry, no rash Neuro:  Alert and Oriented x 3, Strength and sensation are intact Psych: euthymic mood, full affect  Wt Readings from Last 3 Encounters:  05/25/23 185 lb 9.6 oz (84.2 kg)  05/09/23 186 lb 12.8 oz (84.7 kg)  04/11/23 175 lb (79.4 kg)      Studies/Labs Reviewed:   Home sleep study, CPAP titration, PAP compliance download  Recent Labs: 03/14/2023: BUN 15; Creatinine, Ser 1.15; Hemoglobin 13.5; Platelets 176; Potassium 4.1; Sodium 137 03/18/2023: ALT 15; TSH 1.33    CHA2DS2-VASc Score = 4   This indicates a 4.8% annual risk of stroke. The patient's score is based upon: CHF History: 0 HTN History: 1 Diabetes History: 0 Stroke History: 0 Vascular Disease History: 1 Age Score: 2 Gender Score: 0           Additional studies/ records that were reviewed today include:  none    ASSESSMENT:    1. OSA (obstructive sleep apnea)   2. Primary hypertension      PLAN:  In order of problems listed above:  #OSA - The patient is tolerating PAP therapy well without any problems. The PAP download performed by his DME was personally reviewed and interpreted by me today and showed an AHI of 2.4 /hr on 8 cm H2O with 100% compliance in using more than 4 hours nightly.  The patient has been  using and benefiting from PAP use and will continue to benefit from therapy.  -add chin strap to see if it helps with mouth dryness  #Hypertension -BP controlled on exam today -BP currently done controlled   Time Spent: 20 minutes total time of encounter, including 15 minutes spent in face-to-face patient care on the date of this encounter. This time includes coordination of care and counseling regarding above mentioned problem list. Remainder of non-face-to-face time involved reviewing chart documents/testing relevant to the patient encounter and documentation in the medical record. I have independently reviewed documentation from referring provider  Medication Adjustments/Labs and Tests Ordered: Current medicines are reviewed at length with the patient today.  Concerns regarding medicines are outlined above.  Medication changes, Labs and Tests ordered today are listed in the Patient Instructions below.  There are no Patient Instructions on file for this visit.   Signed, Armanda Magic, MD  05/25/2023 11:27 AM    [facility] [address] [place], [place], [place]  [address] Phone: [phone]; Fax: [phone]

## 2023-05-25 NOTE — Addendum Note (Signed)
Addended by: Brunetta Genera on: 05/25/2023 02:04 PM   Modules accepted: Orders

## 2023-06-05 ENCOUNTER — Encounter: Payer: Self-pay | Admitting: Cardiology

## 2023-06-07 DIAGNOSIS — G4733 Obstructive sleep apnea (adult) (pediatric): Secondary | ICD-10-CM | POA: Diagnosis not present

## 2023-06-07 DIAGNOSIS — I10 Essential (primary) hypertension: Secondary | ICD-10-CM | POA: Diagnosis not present

## 2023-06-08 DIAGNOSIS — H02403 Unspecified ptosis of bilateral eyelids: Secondary | ICD-10-CM | POA: Diagnosis not present

## 2023-06-09 DIAGNOSIS — H02831 Dermatochalasis of right upper eyelid: Secondary | ICD-10-CM | POA: Diagnosis not present

## 2023-06-09 DIAGNOSIS — H57813 Brow ptosis, bilateral: Secondary | ICD-10-CM | POA: Diagnosis not present

## 2023-06-09 DIAGNOSIS — M3501 Sicca syndrome with keratoconjunctivitis: Secondary | ICD-10-CM | POA: Diagnosis not present

## 2023-06-15 DIAGNOSIS — K080 Exfoliation of teeth due to systemic causes: Secondary | ICD-10-CM | POA: Diagnosis not present

## 2023-07-06 DIAGNOSIS — D2272 Melanocytic nevi of left lower limb, including hip: Secondary | ICD-10-CM | POA: Diagnosis not present

## 2023-07-06 DIAGNOSIS — D2262 Melanocytic nevi of left upper limb, including shoulder: Secondary | ICD-10-CM | POA: Diagnosis not present

## 2023-07-06 DIAGNOSIS — D2271 Melanocytic nevi of right lower limb, including hip: Secondary | ICD-10-CM | POA: Diagnosis not present

## 2023-07-06 DIAGNOSIS — D2261 Melanocytic nevi of right upper limb, including shoulder: Secondary | ICD-10-CM | POA: Diagnosis not present

## 2023-07-06 DIAGNOSIS — L570 Actinic keratosis: Secondary | ICD-10-CM | POA: Diagnosis not present

## 2023-07-07 NOTE — Progress Notes (Unsigned)
 Electrophysiology Clinic Note    Date:  07/08/2023  Patient ID:  Nathan, Watts 1946/02/14, MRN [id number] PCP:  Dale [name], MD  Cardiologist:  None Electrophysiologist: Lanier Prude, MD   Discussed the use of AI scribe software for clinical note transcription with the patient, who gave verbal consent to proceed.   Patient Profile    Chief Complaint: AF ablation follow-up  History of Present Illness: Nathan Watts is a 78 y.o. male with PMH notable for parox AFib, aflutter, HTN, aortic atherosclerosis, ascending aorta aneurysm, OSA; seen today for Lanier Prude, MD for routine electrophysiology followup.  He is s/p AFib, aflutter ablation w PVI and CTI on 03/2022 He is s/p redo AFib ablation with PVI and posterior wall on 03/2023 by Dr. Lalla Brothers.  He was maintaining sinus rhythm at 83month post-ablation appt. EKG with bigeminal PACs   On follow-up today, he is doing very well. No recent AFib episodes that he is aware of. He has continued to use CPAP nightly, but questions whether he is sleeping "better" with it.  Continues to take eliquis, no bleeding concerns.   He denies chest pain, chest pressure, SOB, dizziness or presyncope.   AAD History: None     ROS:  Please see the history of present illness. All other systems are reviewed and otherwise negative.    Physical Exam    VS:  BP 138/80 (BP Location: Left Arm, Patient Position: Sitting, Cuff Size: Normal)   Pulse (!) 50   Ht 5\' 11"  (1.803 m)   Wt 181 lb 12.8 oz (82.5 kg)   SpO2 99%   BMI 25.36 kg/m  BMI: Body mass index is 25.36 kg/m.  Wt Readings from Last 3 Encounters:  07/08/23 181 lb 12.8 oz (82.5 kg)  05/25/23 185 lb 9.6 oz (84.2 kg)  05/09/23 186 lb 12.8 oz (84.7 kg)     GEN- The patient is well appearing, alert and oriented x 3 today.   Lungs- Clear to ausculation bilaterally, normal work of breathing.  Heart- Regular rate and rhythm, no murmurs, rubs or  gallops Extremities- No peripheral edema, warm, dry   Studies Reviewed   Previous EP, cardiology notes.    EKG is ordered. Personal review of EKG from today shows: SB with 1st deg HB at 50bpm PR       Chest CT, 03/15/2023 (over-read) 1. Aneurysmal dilatation of the ascending aorta, 4.2 cm, previously 4.1 cm. Recommend annual imaging followup by CTA or MRA. This recommendation follows 2010 ACCF/AHA/AATS/ACR/ASA/SCA/SCAI/SIR/STS/SVM Guidelines for the Diagnosis and Management of Patients with Thoracic Aortic Disease. Circulation. 2010; 121: W098-J191. Aortic aneurysm NOS (ICD10-I71.9) 2. Minimal hiatal hernia.  Long term monitor, 12/27/2022 2 nonsustained VT, longest 4 beats. 38 nonsustained SVT, longest 13.1 seconds with an average rate of 126 bpm. Rare supraventricular and ventricular ectopy. No atrial fibrillation. No sustained arrhythmias.  TTE, 01/11/2022  1. Left ventricular ejection fraction, by estimation, is 55 to 60%. The left ventricle has normal function. The left ventricle has no regional wall motion abnormalities. Left ventricular diastolic parameters were normal. The average left ventricular global longitudinal strain is -19.2 %. The global longitudinal strain is normal.   2. Right ventricular systolic function is normal. The right ventricular size is normal. There is normal pulmonary artery systolic pressure.   3. Left atrial size was moderately dilated.   4. The mitral valve is normal in structure. Mild mitral valve regurgitation. No evidence of mitral stenosis.   5.  The aortic valve is normal in structure. Aortic valve regurgitation is not visualized. No aortic stenosis is present.   6. The inferior vena cava is dilated in size with >50% respiratory variability, suggesting right atrial pressure of 8 mmHg.    Assessment and Plan     #) parox AFib #) aflutter S/p AF, flutter ablation 03/2022 S/p redo AF ablation 03/2023 Maintaining sinus rhythm since most  recent ablation  #) Hypercoag d/t parox afib CHA2DS2-VASc Score = at least 4 [CHF History: 0, HTN History: 1, Diabetes History: 0, Stroke History: 0, Vascular Disease History: 1, Age Score: 2, Gender Score: 0].  Therefore, the patient's annual risk of stroke is 4.8 %.    Stroke ppx - 5mg  eliquis BID, appropriately dosed No bleeding concerns  #) OSA Congratulated on persistence with using CPAP Encouraged ongoing nightly use        Current medicines are reviewed at length with the patient today.   The patient does not have concerns regarding his medicines.  The following changes were made today:  none  Labs/ tests ordered today include:  Orders Placed This Encounter  Procedures   EKG 12-Lead     Disposition: Follow up with Dr. Lalla Brothers or EP APP in 12 months, sooner if needed   Signed, Sherie Don, NP  07/08/23  1:31 PM  Electrophysiology [facility]

## 2023-07-08 ENCOUNTER — Ambulatory Visit: Payer: Medicare Other | Attending: Cardiology | Admitting: Cardiology

## 2023-07-08 VITALS — BP 138/80 | HR 50 | Ht 71.0 in | Wt 181.8 lb

## 2023-07-08 DIAGNOSIS — I480 Paroxysmal atrial fibrillation: Secondary | ICD-10-CM

## 2023-07-08 DIAGNOSIS — D6869 Other thrombophilia: Secondary | ICD-10-CM | POA: Diagnosis not present

## 2023-07-08 DIAGNOSIS — I483 Typical atrial flutter: Secondary | ICD-10-CM | POA: Diagnosis not present

## 2023-07-08 NOTE — Patient Instructions (Signed)
 Medication Instructions:  Your Physician recommend you continue on your current medication as directed.    *If you need a refill on your cardiac medications before your next appointment, please call your pharmacy*  Lab Work: None ordered at this time   Follow-Up: At [facility], you and your health needs are our priority.  As part of our continuing mission to provide you with exceptional heart care, our providers are all part of one team.  This team includes your primary Cardiologist (physician) and Advanced Practice Providers or APPs (Physician Assistants and Nurse Practitioners) who all work together to provide you with the care you need, when you need it.  Your next appointment:   1 year(s)  Provider:   Steffanie Dunn, MD or Sherie Don, NP

## 2023-07-21 ENCOUNTER — Other Ambulatory Visit: Payer: Self-pay | Admitting: Cardiology

## 2023-07-21 DIAGNOSIS — I4892 Unspecified atrial flutter: Secondary | ICD-10-CM

## 2023-07-21 NOTE — Telephone Encounter (Signed)
 Prescription refill request for Eliquis received. Indication: afib  Last office visit:Riddle, 07/08/2023 Scr: 1.15, 03/14/2023 Age: 78 yo  Weight: 82. 5 kg   Refill sent.

## 2023-07-26 DIAGNOSIS — G4733 Obstructive sleep apnea (adult) (pediatric): Secondary | ICD-10-CM | POA: Diagnosis not present

## 2023-07-26 DIAGNOSIS — I10 Essential (primary) hypertension: Secondary | ICD-10-CM | POA: Diagnosis not present

## 2023-08-08 ENCOUNTER — Encounter: Payer: Self-pay | Admitting: Internal Medicine

## 2023-08-08 ENCOUNTER — Ambulatory Visit (INDEPENDENT_AMBULATORY_CARE_PROVIDER_SITE_OTHER): Admitting: Internal Medicine

## 2023-08-08 VITALS — BP 122/70 | HR 46 | Temp 97.8°F | Ht 71.0 in | Wt 180.0 lb

## 2023-08-08 DIAGNOSIS — R6889 Other general symptoms and signs: Secondary | ICD-10-CM | POA: Diagnosis not present

## 2023-08-08 DIAGNOSIS — J069 Acute upper respiratory infection, unspecified: Secondary | ICD-10-CM

## 2023-08-08 LAB — POC COVID19 BINAXNOW: SARS Coronavirus 2 Ag: NEGATIVE

## 2023-08-08 LAB — POCT INFLUENZA A/B
Influenza A, POC: NEGATIVE
Influenza B, POC: NEGATIVE

## 2023-08-08 MED ORDER — CETIRIZINE HCL 5 MG PO TABS: 5.0000 mg | ORAL_TABLET | Freq: Every day | ORAL | 0 refills | Status: DC

## 2023-08-08 MED ORDER — SALINE SPRAY 0.65 % NA SOLN: 1.0000 | NASAL | 2 refills | Status: DC | PRN

## 2023-08-08 MED ORDER — FLUTICASONE PROPIONATE 50 MCG/ACT NA SUSP: 2.0000 | Freq: Every day | NASAL | 6 refills | Status: DC

## 2023-08-08 NOTE — Assessment & Plan Note (Addendum)
-   Patient presents today with nasal congestion, rhinorrhea and a cough productive of yellowish phlegm.  No fevers or chills or shortness of breath or wheezing noted -On exam, patient's lungs appear to auscultation bilaterally.  No sinus tenderness noted on palpation.  Nasal turbinates are swollen -Suspect patient likely has a viral URI or allergies causing his symptoms -Flu and COVID test were negative -No indication for antibiotics at this time -Will treat the patient conservatively with saline nasal spray, Flonase  and Zyrtec.  Also recommended steam inhalation with warm showers -Return precautions given to the patient

## 2023-08-08 NOTE — Progress Notes (Signed)
 Acute Office Visit  Subjective:     Patient ID: Nathan Watts, male    DOB: January 08, 1946, 78 y.o.   MRN: [id number]  Chief Complaint  Patient presents with   Nasal Congestion   Cough    Cough Associated symptoms include heartburn. Pertinent negatives include no chills, ear pain, fever, hemoptysis, sore throat, shortness of breath or wheezing.   Patient is in today for nasal congestion and cough beginning 5 days ago.  Patient states that his symptoms initially started with a tickle at the back of his throat but he then subsequently developed nasal congestion and rhinorrhea as well as a slight headache.  Over the weekend, he developed cough productive of yellowish phlegm.  No fevers or chills.  No sinus tenderness or congestion.  No ear pain or discharge.  No sore throat.  Review of Systems  Constitutional:  Positive for malaise/fatigue. Negative for chills and fever.  HENT:  Positive for congestion. Negative for ear discharge, ear pain, sinus pain and sore throat.   Respiratory:  Positive for cough. Negative for hemoptysis, shortness of breath and wheezing.   Cardiovascular: Negative.   Gastrointestinal:  Positive for heartburn. Negative for diarrhea, nausea and vomiting.  Musculoskeletal: Negative.   Neurological: Negative.   Psychiatric/Behavioral: Negative.          Objective:    BP 122/70   Pulse (!) 46   Temp 97.8 F (36.6 C) (Oral)   Ht 5\' 11"  (1.803 m)   Wt 180 lb (81.6 kg)   SpO2 95%   BMI 25.10 kg/m    Physical Exam Constitutional:      Appearance: Normal appearance.  HENT:     Head: Normocephalic and atraumatic.     Right Ear: Tympanic membrane, ear canal and external ear normal.     Left Ear: Tympanic membrane, ear canal and external ear normal.     Nose: Congestion and rhinorrhea present.     Right Turbinates: Swollen.     Left Turbinates: Swollen.     Right Sinus: No maxillary sinus tenderness or frontal sinus tenderness.     Left Sinus: No  frontal sinus tenderness.     Mouth/Throat:     Mouth: Mucous membranes are moist.     Pharynx: Oropharynx is clear. No oropharyngeal exudate or posterior oropharyngeal erythema.  Cardiovascular:     Rate and Rhythm: Normal rate and regular rhythm.  Pulmonary:     Effort: Pulmonary effort is normal. No respiratory distress.     Breath sounds: Normal breath sounds. No wheezing or rales.  Musculoskeletal:     Cervical back: Neck supple.  Lymphadenopathy:     Cervical: No cervical adenopathy.  Neurological:     Mental Status: He is alert and oriented to person, place, and time.  Psychiatric:        Mood and Affect: Mood normal.        Behavior: Behavior normal.   Call  No results found for any visits on 08/08/23.      Assessment & Plan:   Problem List Items Addressed This Visit       Respiratory   Viral URI with cough   - Patient presents today with nasal congestion, rhinorrhea and a cough productive of yellowish phlegm.  No fevers or chills or shortness of breath or wheezing noted -On exam, patient's lungs appear to auscultation bilaterally.  No sinus tenderness noted on palpation.  Nasal turbinates are swollen -Suspect patient likely has a viral URI or  allergies causing his symptoms -Flu and COVID test were negative -No indication for antibiotics at this time -Will treat the patient conservatively with saline nasal spray, Flonase  and Zyrtec.  Also recommended steam inhalation with warm showers -Return precautions given to the patient      Relevant Medications   sodium chloride  (OCEAN) 0.65 % SOLN nasal spray   cetirizine (ZYRTEC) 5 MG tablet   fluticasone  (FLONASE ) 50 MCG/ACT nasal spray   Other Visit Diagnoses       Flu-like symptoms    -  Primary   Relevant Orders   POC COVID-19   POCT Influenza A/B       Meds ordered this encounter  Medications   sodium chloride  (OCEAN) 0.65 % SOLN nasal spray    Sig: Place 1 spray into both nostrils as needed for  congestion.    Dispense:  30 mL    Refill:  2   cetirizine (ZYRTEC) 5 MG tablet    Sig: Take 1 tablet (5 mg total) by mouth daily.    Dispense:  30 tablet    Refill:  0   fluticasone  (FLONASE ) 50 MCG/ACT nasal spray    Sig: Place 2 sprays into both nostrils daily.    Dispense:  16 g    Refill:  6    No follow-ups on file.  Amadeo Coke, MD

## 2023-08-08 NOTE — Progress Notes (Signed)
 POC

## 2023-08-08 NOTE — Patient Instructions (Signed)
-   It was a pleasure meeting you today -I do suspect you have a viral upper respiratory tract infection or allergies that are causing your symptoms -I do not believe that antibiotics are currently indicated but if your symptoms worsen or you develop fevers please contact us  for further evaluation - I do recommend conservative measures including warm showers with steam inhalation, Flonase , Zyrtec, saline nasal spray -We will also check for flu and COVID today

## 2023-08-11 ENCOUNTER — Telehealth: Payer: Self-pay

## 2023-08-11 ENCOUNTER — Ambulatory Visit (INDEPENDENT_AMBULATORY_CARE_PROVIDER_SITE_OTHER): Admitting: Internal Medicine

## 2023-08-11 ENCOUNTER — Ambulatory Visit
Admission: RE | Admit: 2023-08-11 | Discharge: 2023-08-11 | Disposition: A | Discharge status: Home / Self Care | Attending: Internal Medicine | Admitting: Internal Medicine

## 2023-08-11 ENCOUNTER — Encounter: Payer: Self-pay | Admitting: Internal Medicine

## 2023-08-11 ENCOUNTER — Ambulatory Visit
Admission: RE | Admit: 2023-08-11 | Discharge: 2023-08-11 | Disposition: A | Discharge status: Home / Self Care | Source: Ambulatory Visit | Attending: Internal Medicine | Admitting: Internal Medicine

## 2023-08-11 VITALS — BP 120/74 | HR 67 | Temp 98.1°F | Resp 16 | Ht 71.0 in | Wt 178.0 lb

## 2023-08-11 DIAGNOSIS — R918 Other nonspecific abnormal finding of lung field: Secondary | ICD-10-CM | POA: Diagnosis not present

## 2023-08-11 DIAGNOSIS — R0989 Other specified symptoms and signs involving the circulatory and respiratory systems: Secondary | ICD-10-CM | POA: Diagnosis not present

## 2023-08-11 DIAGNOSIS — R059 Cough, unspecified: Secondary | ICD-10-CM | POA: Diagnosis not present

## 2023-08-11 DIAGNOSIS — R053 Chronic cough: Secondary | ICD-10-CM | POA: Diagnosis not present

## 2023-08-11 DIAGNOSIS — I10 Essential (primary) hypertension: Secondary | ICD-10-CM

## 2023-08-11 MED ORDER — PREDNISONE 10 MG PO TABS: ORAL_TABLET | ORAL | 0 refills | Status: DC

## 2023-08-11 MED ORDER — CEFDINIR 300 MG PO CAPS: 300.0000 mg | ORAL_CAPSULE | Freq: Two times a day (BID) | ORAL | 0 refills | Status: DC

## 2023-08-11 NOTE — Progress Notes (Unsigned)
 Subjective:    Patient ID: Nathan Watts, male    DOB: 17-Jan-1946, 78 y.o.   MRN: [id number]  Patient here for  Chief Complaint  Patient presents with   Cough   Nasal Congestion         HPI Here for a work in appt - work in with concerns regarding increased cough and congestion.  Was seen 08/08/23 - nasal congestion and cough. Flu and covid tests - negative. Recommended saline nasal spray, flonase  and zyrtec . Comes in today reporting worsening cough and congestion. Coughing fits. No fever. Some chills. Increased sinus pressure. Increased nasal congestion - yellow mucus production. Increased acid reflux. No sob. No vomiting.    Past Medical History:  Diagnosis Date   BPH (benign prostatic hyperplasia)    Fatty infiltration of liver    GERD (gastroesophageal reflux disease)    Hypercholesterolemia    Hyperglycemia    Hypertension    OSA on CPAP    mild obstructive sleep apnea with an AHI of 9.6 overall but moderate during REM sleep with a REM AHI of 17.5/h.  On CPAP at 8cm H2O   Thrombocytopenia (HCC)    Thyroid  nodule    Past Surgical History:  Procedure Laterality Date   ATRIAL FIBRILLATION ABLATION N/A 03/26/2022   Procedure: ATRIAL FIBRILLATION ABLATION;  Surgeon: Boyce Byes, MD;  Location: [facility];  Service: Cardiovascular;  Laterality: N/A;   ATRIAL FIBRILLATION ABLATION N/A 04/11/2023   Procedure: ATRIAL FIBRILLATION ABLATION;  Surgeon: Boyce Byes, MD;  Location: [facility];  Service: Cardiovascular;  Laterality: N/A;   CATARACT EXTRACTION W/PHACO Left 04/20/2022   Procedure: CATARACT EXTRACTION PHACO AND INTRAOCULAR LENS PLACEMENT (IOC) LEFT 13.73 01:12.4;  Surgeon: Clair Crews, MD;  Location: [facility];  Service: Ophthalmology;  Laterality: Left;   CATARACT EXTRACTION W/PHACO Right 05/04/2022   Procedure: CATARACT EXTRACTION PHACO AND INTRAOCULAR LENS PLACEMENT (IOC) RIGHT;  Surgeon: Clair Crews, MD;  Location:  [facility];  Service: Ophthalmology;  Laterality: Right;  9.42 0:54.4   COLONOSCOPY WITH PROPOFOL  N/A 10/02/2014   Procedure: COLONOSCOPY WITH PROPOFOL ;  Surgeon: Cassie Click, MD;  Location: [facility];  Service: Endoscopy;  Laterality: N/A;   COLONOSCOPY WITH PROPOFOL  N/A 06/15/2021   Procedure: COLONOSCOPY WITH PROPOFOL ;  Surgeon: Shane Darling, MD;  Location: [facility];  Service: Endoscopy;  Laterality: N/A;   ESOPHAGOGASTRODUODENOSCOPY (EGD) WITH PROPOFOL  N/A 06/15/2021   Procedure: ESOPHAGOGASTRODUODENOSCOPY (EGD) WITH PROPOFOL ;  Surgeon: Shane Darling, MD;  Location: [facility];  Service: Endoscopy;  Laterality: N/A;   HOLEP-LASER ENUCLEATION OF THE PROSTATE WITH MORCELLATION N/A 04/24/2021   Procedure: HOLEP-LASER ENUCLEATION OF THE PROSTATE WITH MORCELLATION;  Surgeon: Lawerence Pressman, MD;  Location: [facility];  Service: Urology;  Laterality: N/A;   INGUINAL HERNIA REPAIR     TONSILLECTOMY     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 N/A 04/24/2021   Procedure: 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 ;  Surgeon: Lawerence Pressman, MD;  Location: [facility];  Service: Urology;  Laterality: N/A;   Family History  Problem Relation Age of Onset   Prostate cancer Father    Heart disease Father        s/p CABG   Stroke Mother    Colon cancer Neg Hx    Social History   Socioeconomic History   Marital status: Married    Spouse name: Peggy   Number of children: 4   Years of education: Not  on file   Highest education level: Bachelor's degree (e.g., BA, AB, BS)  Occupational History   Occupation: retired Dentist  Tobacco Use   Smoking status: Former    Current packs/day: 0.00    Average packs/day: 1 pack/day for 30.0 years (30.0 ttl pk-yrs)    Types: Cigarettes    Start date: 04/12/1968    Quit date: 04/12/1998    Years since quitting: 25.3    Passive exposure: Past   Smokeless tobacco: Never  Vaping Use    Vaping status: Never Used  Substance and Sexual Activity   Alcohol use: Yes    Alcohol/week: 2.0 standard drinks of alcohol    Types: 2 Cans of beer per week    Comment: 1-2 beers 1-2 days per week   Drug use: Never   Sexual activity: Never  Other Topics Concern   Not on file  Social History Narrative   He is married. Has four children   Social Drivers of Corporate investment banker Strain: Low Risk  (11/18/2022)   Overall Financial Resource Strain (CARDIA)    Difficulty of Paying Living Expenses: Not hard at all  Food Insecurity: No Food Insecurity (11/18/2022)   Hunger Vital Sign    Worried About Running Out of Food in the Last Year: Never true    Ran Out of Food in the Last Year: Never true  Transportation Needs: No Transportation Needs (11/18/2022)   PRAPARE - Administrator, Civil Service (Medical): No    Lack of Transportation (Non-Medical): No  Physical Activity: Sufficiently Active (11/18/2022)   Exercise Vital Sign    Days of Exercise per Week: 6 days    Minutes of Exercise per Session: 90 min  Stress: Stress Concern Present (11/18/2022)   [employer] of Occupational Health - Occupational Stress Questionnaire    Feeling of Stress : To some extent  Social Connections: Socially Integrated (11/18/2022)   Social Connection and Isolation Panel [NHANES]    Frequency of Communication with Friends and Family: More than three times a week    Frequency of Social Gatherings with Friends and Family: Three times a week    Attends Religious Services: More than 4 times per year    Active Member of Clubs or Organizations: Yes    Attends Banker Meetings: More than 4 times per year    Marital Status: Married     Review of Systems  Constitutional:  Positive for chills. Negative for fever and unexpected weight change.  HENT:  Negative for postnasal drip and sinus pressure.   Respiratory:  Positive for cough. Negative for chest tightness and shortness of breath.    Cardiovascular:  Negative for chest pain, palpitations and leg swelling.  Gastrointestinal:  Negative for abdominal pain, nausea and vomiting.       Acid reflux.   Genitourinary:  Negative for difficulty urinating and dysuria.  Musculoskeletal:  Negative for joint swelling and myalgias.  Skin:  Negative for color change and rash.  Neurological:  Negative for dizziness and headaches.  Psychiatric/Behavioral:  Negative for agitation and dysphoric mood.        Objective:     BP 120/74   Pulse 67   Temp 98.1 F (36.7 C)   Resp 16   Ht 5\' 11"  (1.803 m)   Wt 178 lb (80.7 kg)   SpO2 98%   BMI 24.83 kg/m  Wt Readings from Last 3 Encounters:  08/11/23 178 lb (80.7 kg)  08/08/23 180  lb (81.6 kg)  07/08/23 181 lb 12.8 oz (82.5 kg)    Physical Exam Vitals reviewed.  Constitutional:      General: He is not in acute distress.    Appearance: Normal appearance. He is well-developed.  HENT:     Head: Normocephalic and atraumatic.     Right Ear: External ear normal.     Left Ear: External ear normal.  Eyes:     General: No scleral icterus.       Right eye: No discharge.        Left eye: No discharge.     Conjunctiva/sclera: Conjunctivae normal.  Cardiovascular:     Rate and Rhythm: Normal rate and regular rhythm.  Pulmonary:     Effort: Pulmonary effort is normal.     Comments: Increased cough with forced expiration.  Abdominal:     General: Bowel sounds are normal.     Palpations: Abdomen is soft.     Tenderness: There is no abdominal tenderness.  Musculoskeletal:        General: No swelling or tenderness.     Cervical back: Neck supple. No tenderness.  Lymphadenopathy:     Cervical: No cervical adenopathy.  Skin:    Findings: No erythema or rash.  Neurological:     Mental Status: He is alert.  Psychiatric:        Mood and Affect: Mood normal.        Behavior: Behavior normal.         Outpatient Encounter Medications as of 08/11/2023  Medication Sig   cefdinir   (OMNICEF ) 300 MG capsule Take 1 capsule (300 mg total) by mouth 2 (two) times daily.   predniSONE  (DELTASONE ) 10 MG tablet Take 6 tablets x 1 day and then decrease by 1/2 tablet per day until down to zero mg.   atorvastatin  (LIPITOR) 20 MG tablet Take 1 tablet (20 mg total) by mouth daily.   cetirizine  (ZYRTEC ) 5 MG tablet Take 1 tablet (5 mg total) by mouth daily.   dextromethorphan-guaiFENesin (MUCINEX DM) 30-600 MG 12hr tablet Take 1 tablet by mouth 2 (two) times daily as needed (congestion).   ELIQUIS  5 MG TABS tablet TAKE 1 TABLET BY MOUTH TWICE DAILY   fluticasone  (FLONASE ) 50 MCG/ACT nasal spray Place 2 sprays into both nostrils daily.   metroNIDAZOLE (METROGEL) 0.75 % gel Apply 1 application  topically 2 (two) times daily. to face   Multiple Vitamin (MULTIVITAMIN WITH MINERALS) TABS tablet Take 1 tablet by mouth in the morning.   Omega-3 Fatty Acids (FISH OIL) 1200 MG CAPS Take 1,200 mg by mouth in the morning.   omeprazole (PRILOSEC OTC) 20 MG tablet Take 20 mg by mouth every evening.   pantoprazole  (PROTONIX ) 40 MG tablet Take 1 tablet (40 mg total) by mouth daily.   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   Probiotic Product (PROBIOTIC PO) Take 1 capsule by mouth in the morning.   Psyllium (METAMUCIL PO) Take 1 Scoop by mouth in the morning. Metamucil Fiber 1 scoop = teaspoonful   sodium chloride  (OCEAN) 0.65 % SOLN nasal spray Place 1 spray into both nostrils as needed for congestion.   [DISCONTINUED] cyanocobalamin 1000 MCG tablet Take 1,000 mcg by mouth in the morning.   No facility-administered encounter medications on file as of 08/11/2023.     Lab Results  Component Value Date   WBC 7.6 03/14/2023   HGB 13.5 03/14/2023   HCT 40.4 03/14/2023  PLT 176 03/14/2023   GLUCOSE 90 03/14/2023   CHOL 149 03/18/2023   TRIG 70.0 03/18/2023   HDL 47.50 03/18/2023   LDLCALC 88 03/18/2023   ALT 15 03/18/2023   AST 19 03/18/2023   NA  137 03/14/2023   K 4.1 03/14/2023   CL 103 03/14/2023   CREATININE 1.15 03/14/2023   BUN 15 03/14/2023   CO2 27 03/14/2023   TSH 1.33 03/18/2023   PSA 0.32 03/18/2023   HGBA1C 5.9 03/18/2023    EP STUDY Result Date: 04/11/2023 CONCLUSIONS: 1. Successful redo PVI 2. Successful ablation/isolation of the posterior wall 3. Intracardiac echo reveals trivial pericardial effusion, normal LA architecture 4. No early apparent complications. 5. Colchicine  0.6mg  PO BID x 5 days 6. Protonix  40mg  PO daily x 45 days       Assessment & Plan:  Persistent cough Assessment & Plan: Increased cough and congestion as outlined. Worsening symptoms. Coughing fits. Chills. Increased cough and some wheezing with forced expiration. Given worsening symptoms, will treat with omnicef . Prednisone  taper. Continue mucinex. Check cxr to confirm no pneumonia. Rest. Stay hydrated. Follow.  Call with update.   Orders: -     DG Chest 2 View; Future  Hypertension, essential Assessment & Plan: Blood pressure has done well on no medication. Follow pressures.    Other orders -     Cefdinir ; Take 1 capsule (300 mg total) by mouth 2 (two) times daily.  Dispense: 14 capsule; Refill: 0 -     predniSONE ; Take 6 tablets x 1 day and then decrease by 1/2 tablet per day until down to zero mg.  Dispense: 39 tablet; Refill: 0     Dellar Fenton, MD

## 2023-08-11 NOTE — Patient Instructions (Signed)
 Continue flonase  - 2 sprays each nostril.  Do this in the evening.   Saline nasal spray - flush nose 1-2 x/day   Continue mucinex  Taking the antibiotic and prednisone  as directed.   Take a probiotic daily while you are on the antibiotic and for two weeks after completing the antibiotlics.

## 2023-08-11 NOTE — Telephone Encounter (Signed)
 Noted.

## 2023-08-11 NOTE — Telephone Encounter (Signed)
 Copied from CRM [id number]. Topic: Clinical - Lab/Test Results >> Aug 11, 2023  4:09 PM Melissa C wrote: Reason for CRM: Imaging center calling to let physician know that patient's x-ray results are now ready to be reviewed in his chart.

## 2023-08-11 NOTE — Telephone Encounter (Unsigned)
 Copied from CRM [id number]. Topic: Clinical - Lab/Test Results >> Aug 11, 2023  4:09 PM Melissa C wrote: Reason for CRM: Imaging center calling to let physician know that patient's x-ray results are now ready to be reviewed in his chart.

## 2023-08-11 NOTE — Telephone Encounter (Signed)
 Pt scheduled

## 2023-08-14 ENCOUNTER — Encounter: Payer: Self-pay | Admitting: Internal Medicine

## 2023-08-14 NOTE — Assessment & Plan Note (Signed)
 Increased cough and congestion as outlined. Worsening symptoms. Coughing fits. Chills. Increased cough and some wheezing with forced expiration. Given worsening symptoms, will treat with omnicef . Prednisone  taper. Continue mucinex. Check cxr to confirm no pneumonia. Rest. Stay hydrated. Follow.  Call with update.

## 2023-08-14 NOTE — Assessment & Plan Note (Signed)
 Blood pressure has done well on no medication. Follow pressures.

## 2023-08-30 ENCOUNTER — Encounter (INDEPENDENT_AMBULATORY_CARE_PROVIDER_SITE_OTHER): Payer: Self-pay

## 2023-09-01 ENCOUNTER — Encounter: Payer: Self-pay | Admitting: Internal Medicine

## 2023-09-01 DIAGNOSIS — R1031 Right lower quadrant pain: Secondary | ICD-10-CM | POA: Diagnosis not present

## 2023-09-01 NOTE — Telephone Encounter (Signed)
Called patient. Mail box full.

## 2023-09-04 IMAGING — US US ABDOMEN COMPLETE W/ ELASTOGRAPHY
1 series · 12 of 25 positions shown · non-contrast
Comparison: January 26, 2016

CLINICAL DATA: History of hepatic steatosis with F2-F3 fibrosis in
5302

EXAM:
ULTRASOUND ABDOMEN
ULTRASOUND HEPATIC ELASTOGRAPHY
TECHNIQUE: Sonography of the upper abdomen was performed. In addition,
ultrasound elastography evaluation of the liver was performed. A
region of interest was placed within the right lobe of the liver.
Following application of a compressive sonographic pulse, tissue
compressibility was assessed. Multiple assessments were performed at
the selected site. Median tissue compressibility was determined.
Previously, hepatic stiffness was assessed by shear wave velocity.
Based on recently published Society of Radiologists in Ultrasound
consensus article, reporting is now recommended to be performed in
the SI units of pressure (kiloPascals) representing hepatic
stiffness/elasticity. The obtained result is compared to the
published reference standards. (cACLD = compensated Advanced Chronic
Liver Disease)

[Series 1: us abdomen complete w/elastography · 12 of 76 slices shown]
[im 4/76]
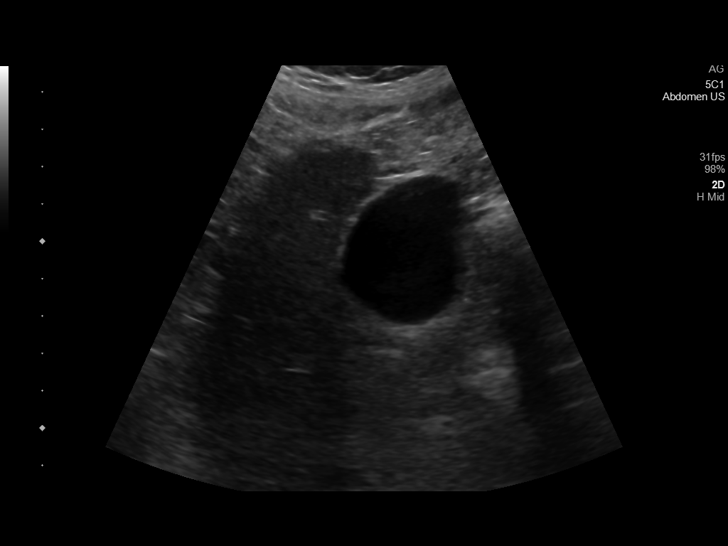
[im 10/76]
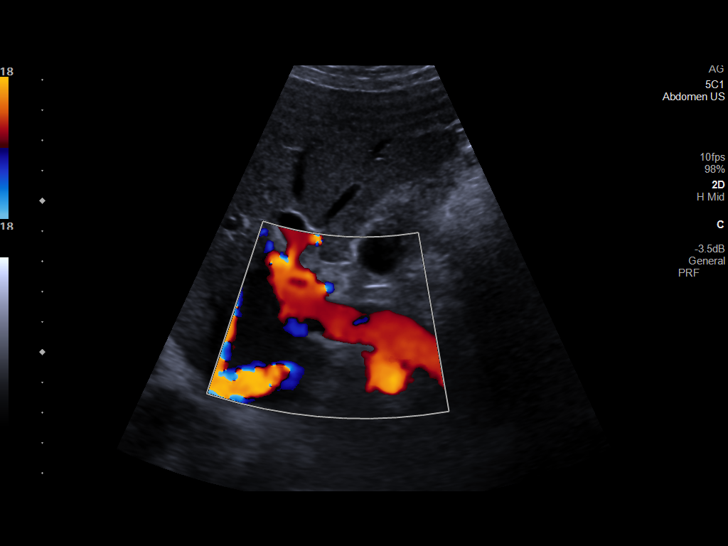
[im 16/76]
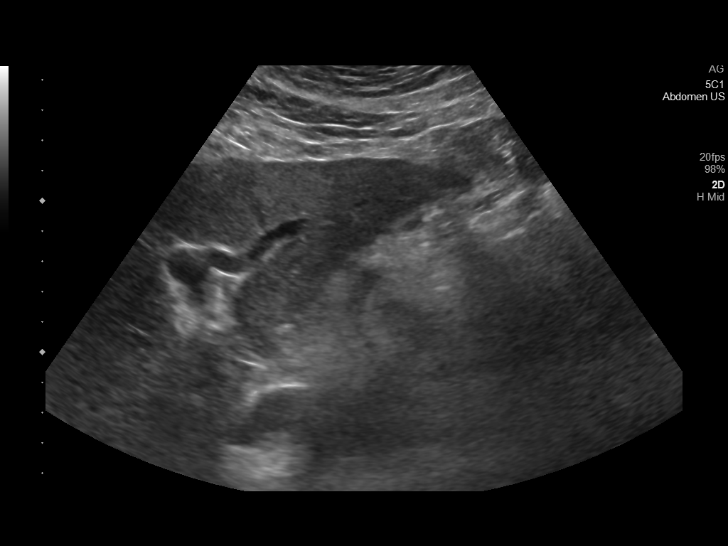
[im 22/76]
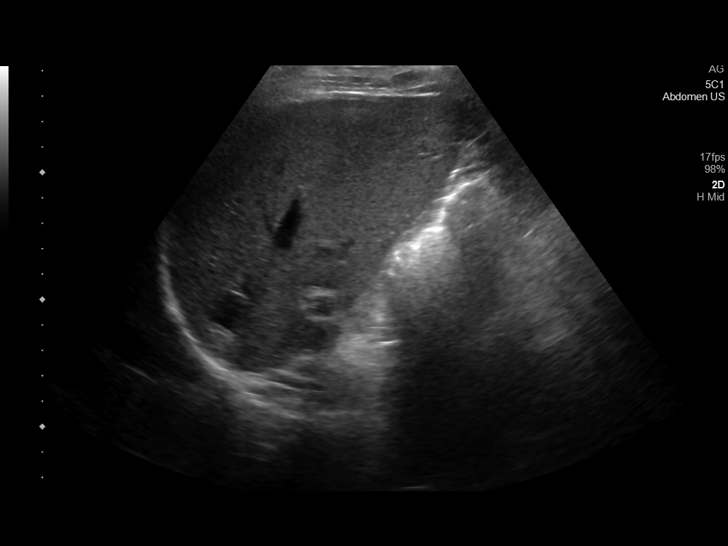
[im 29/76]
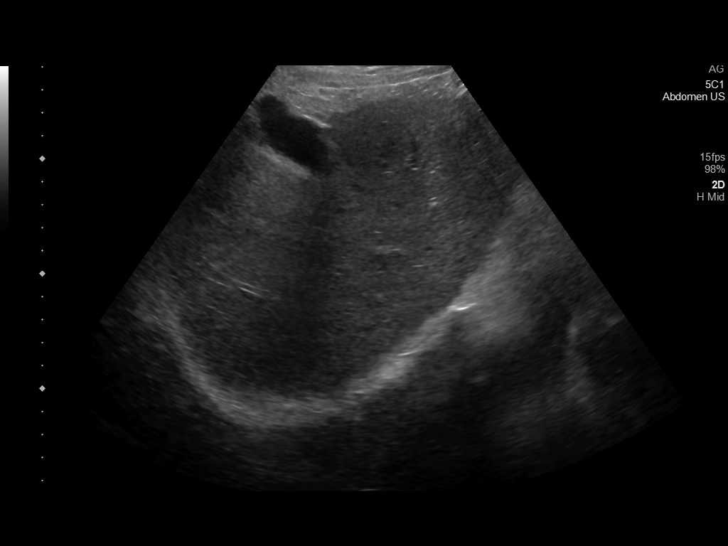
[im 35/76]
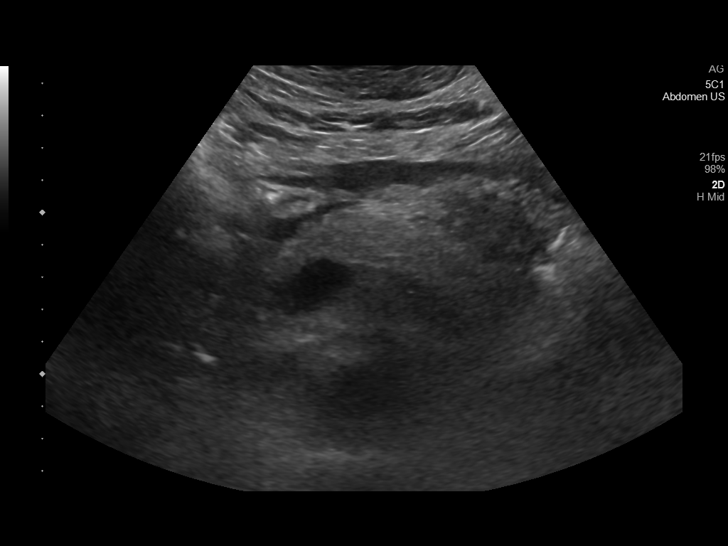
[im 41/76]
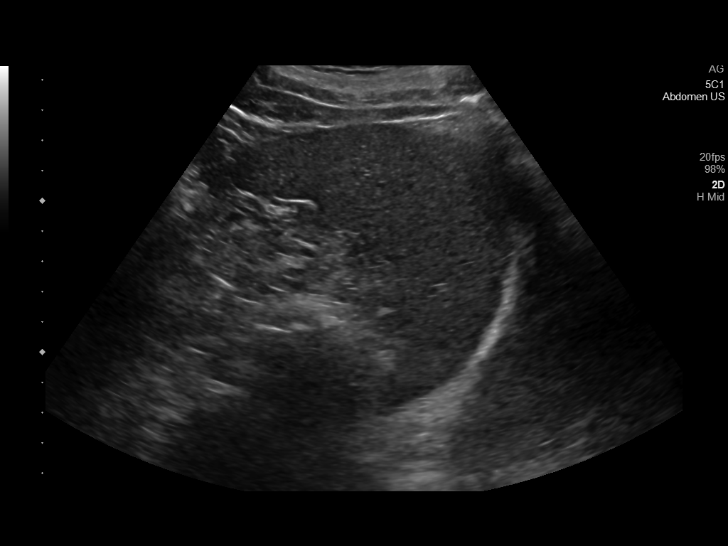
[im 47/76]
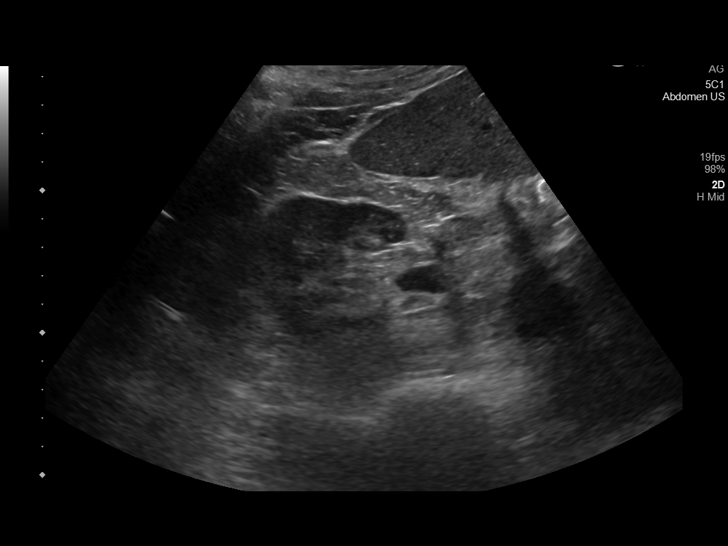
[im 54/76]
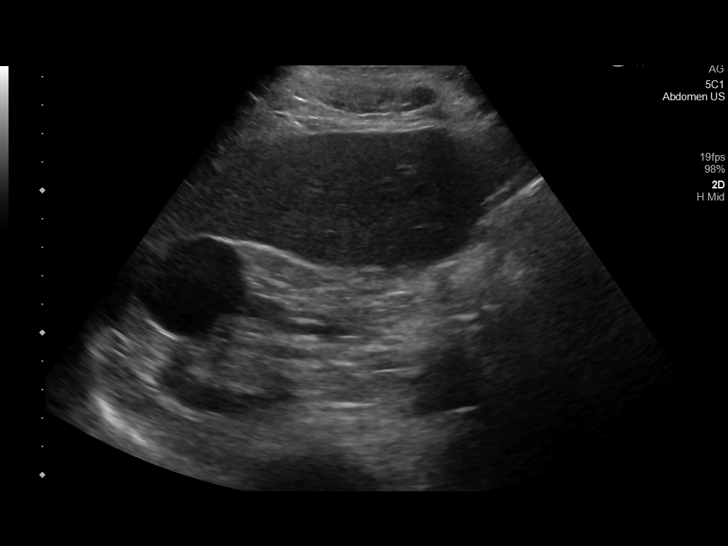
[im 60/76]
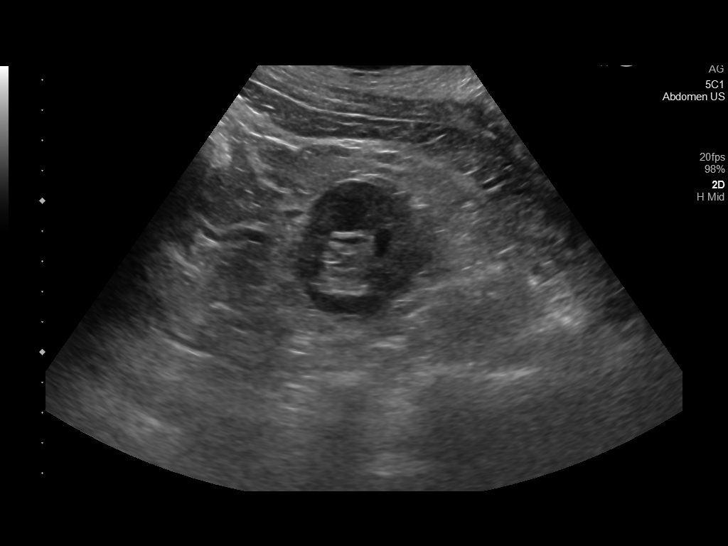
[im 66/76]
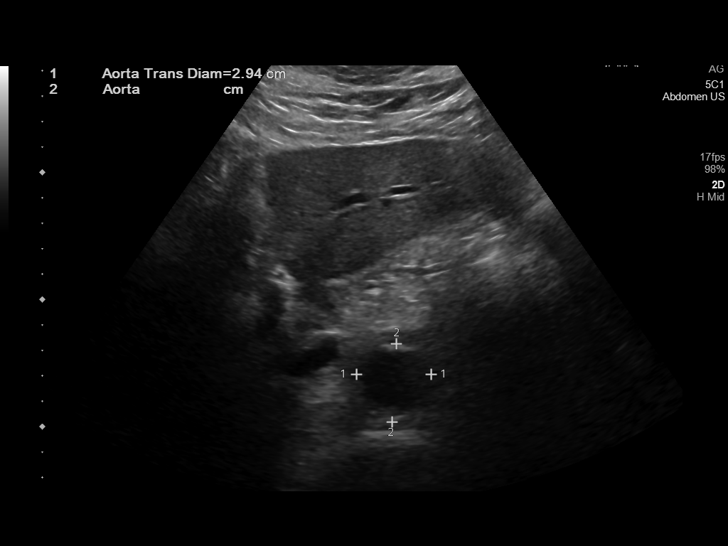
[im 72/76]
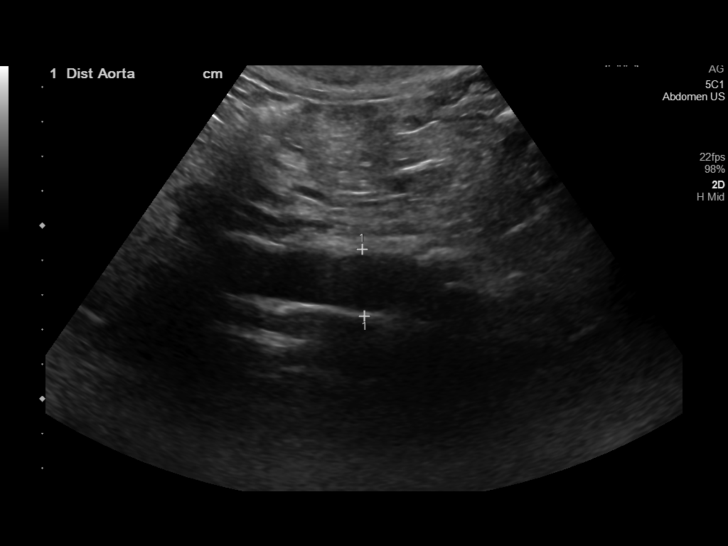

[12 of 25 positions shown; findings below may reference images not displayed]

FINDINGS: ULTRASOUND ABDOMEN

Gallbladder: No gallstones or wall thickening visualized. No
sonographic Murphy sign noted by sonographer.

Common bile duct: Diameter: 4 mm

Liver: 1.5 cm cyst. Diffusely increased parenchymal echogenicity.
Portal vein is patent on color Doppler imaging with normal direction
of blood flow towards the liver.

IVC: No abnormality visualized.

Pancreas: Visualized portion unremarkable.

Spleen: Size and appearance within normal limits.

Right Kidney: Length: 11.2 cm. Echogenicity within normal limits. No
hydronephrosis. 4.4 cm cyst.

Left Kidney: Length: 12.1 cm. Echogenicity within normal limits. No
mass or hydronephrosis visualized.

Abdominal aorta: Aneurysmal dilation of the proximal aorta measuring
3.1 cm

Other findings: None.

ULTRASOUND HEPATIC ELASTOGRAPHY

Device: Siemens Helix VTQ

Patient position: Supine

Transducer 5C1

Number of measurements: 10

Hepatic segment:  8

Median kPa:

IQR:

IQR/Median kPa ratio:

Data quality:  Good

Diagnostic category:  < or = 5 kPa: high probability of being normal

The use of hepatic elastography is applicable to patients with viral
hepatitis and non-alcoholic fatty liver disease. At this time, there
is insufficient data for the referenced cut-off values and use in
other causes of liver disease, including alcoholic liver disease.
Patients, however, may be assessed by elastography and serve as
their own reference standard/baseline.

In patients with non-alcoholic liver disease, the values suggesting
compensated advanced chronic liver disease (cACLD) may be lower, and
patients may need additional testing with elasticity results of [DATE]
kPa.

Please note that abnormal hepatic elasticity and shear wave
velocities may also be identified in clinical settings other than
with hepatic fibrosis, such as: acute hepatitis, elevated right
heart and central venous pressures including use of beta blockers,
Smoot disease (Klpigbb), infiltrative processes such as
mastocytosis/amyloidosis/infiltrative tumor/lymphoma, extrahepatic
cholestasis, with hyperemia in the post-prandial state, and with
liver transplantation. Correlation with patient history, laboratory
data, and clinical condition recommended.

Diagnostic Categories:

< or =5 kPa: high probability of being normal

< or =9 kPa: in the absence of other known clinical signs, rules [DATE] kPa and ?13 kPa: suggestive of cACLD, but needs further testing

>13 kPa: highly suggestive of cACLD

> or =17 kPa: highly suggestive of cACLD with an increased
probability of clinically significant portal hypertension
IMPRESSION: ULTRASOUND ABDOMEN:

Diffusely increased hepatic parenchymal echogenicity, nonspecific
but most commonly reflecting hepatic steatosis.

Aneurysmal dilation of the proximal aorta measuring 3.1 cm.
Recommend follow-up ultrasound every 3 years. Reference: [HOSPITAL] 9448;[DATE].

ULTRASOUND HEPATIC ELASTOGRAPHY:

Median kPa:

Diagnostic category:  < or = 5 kPa: high probability of being normal

## 2023-09-12 ENCOUNTER — Other Ambulatory Visit: Payer: Self-pay | Admitting: Internal Medicine

## 2023-09-12 ENCOUNTER — Other Ambulatory Visit (INDEPENDENT_AMBULATORY_CARE_PROVIDER_SITE_OTHER): Payer: Medicare Other

## 2023-09-12 ENCOUNTER — Ambulatory Visit (INDEPENDENT_AMBULATORY_CARE_PROVIDER_SITE_OTHER)

## 2023-09-12 ENCOUNTER — Ambulatory Visit: Payer: Self-pay | Admitting: Internal Medicine

## 2023-09-12 DIAGNOSIS — R739 Hyperglycemia, unspecified: Secondary | ICD-10-CM | POA: Diagnosis not present

## 2023-09-12 DIAGNOSIS — I10 Essential (primary) hypertension: Secondary | ICD-10-CM | POA: Diagnosis not present

## 2023-09-12 DIAGNOSIS — E7800 Pure hypercholesterolemia, unspecified: Secondary | ICD-10-CM

## 2023-09-12 LAB — CBC WITH DIFFERENTIAL/PLATELET
Basophils Absolute: 0 10*3/uL (ref 0.0–0.1)
Basophils Relative: 0.6 % (ref 0.0–3.0)
Eosinophils Absolute: 0.3 10*3/uL (ref 0.0–0.7)
Eosinophils Relative: 4.9 % (ref 0.0–5.0)
HCT: 40.1 % (ref 39.0–52.0)
Hemoglobin: 13.3 g/dL (ref 13.0–17.0)
Lymphocytes Relative: 28.6 % (ref 12.0–46.0)
Lymphs Abs: 1.8 10*3/uL (ref 0.7–4.0)
MCHC: 33.1 g/dL (ref 30.0–36.0)
MCV: 88.7 fl (ref 78.0–100.0)
Monocytes Absolute: 0.8 10*3/uL (ref 0.1–1.0)
Monocytes Relative: 12.1 % — ABNORMAL HIGH (ref 3.0–12.0)
Neutro Abs: 3.4 10*3/uL (ref 1.4–7.7)
Neutrophils Relative %: 53.8 % (ref 43.0–77.0)
Platelets: 188 10*3/uL (ref 150.0–400.0)
RBC: 4.52 Mil/uL (ref 4.22–5.81)
RDW: 14.6 % (ref 11.5–15.5)
WBC: 6.4 10*3/uL (ref 4.0–10.5)

## 2023-09-12 LAB — BASIC METABOLIC PANEL WITH GFR
BUN: 15 mg/dL (ref 6–23)
CO2: 28 meq/L (ref 19–32)
Calcium: 9.1 mg/dL (ref 8.4–10.5)
Chloride: 104 meq/L (ref 96–112)
Creatinine, Ser: 1.1 mg/dL (ref 0.40–1.50)
GFR: 64.07 mL/min (ref >60.00)
Glucose, Bld: 102 mg/dL — ABNORMAL HIGH (ref 70–99)
Potassium: 4.3 meq/L (ref 3.5–5.1)
Sodium: 138 meq/L (ref 135–145)

## 2023-09-12 LAB — HEPATIC FUNCTION PANEL
ALT: 15 U/L (ref 0–53)
AST: 19 U/L (ref 0–37)
Albumin: 4 g/dL (ref 3.5–5.2)
Alkaline Phosphatase: 56 U/L (ref 39–117)
Bilirubin, Direct: 0.1 mg/dL (ref 0.0–0.3)
Total Bilirubin: 0.5 mg/dL (ref 0.2–1.2)
Total Protein: 6.8 g/dL (ref 6.0–8.3)

## 2023-09-12 LAB — HEMOGLOBIN A1C: Hgb A1c MFr Bld: 6 % (ref 4.6–6.5)

## 2023-09-12 LAB — LIPID PANEL
Cholesterol: 139 mg/dL (ref 0–200)
HDL: 50.2 mg/dL (ref >39.00)
LDL Cholesterol: 75 mg/dL (ref 0–99)
NonHDL: 89.06
Total CHOL/HDL Ratio: 3
Triglycerides: 68 mg/dL (ref 0.0–149.0)
VLDL: 13.6 mg/dL (ref 0.0–40.0)

## 2023-09-12 NOTE — Progress Notes (Signed)
Orders placed for add on labs.  

## 2023-09-15 ENCOUNTER — Encounter: Payer: Self-pay | Admitting: Internal Medicine

## 2023-09-15 ENCOUNTER — Ambulatory Visit (INDEPENDENT_AMBULATORY_CARE_PROVIDER_SITE_OTHER)

## 2023-09-15 ENCOUNTER — Ambulatory Visit (INDEPENDENT_AMBULATORY_CARE_PROVIDER_SITE_OTHER): Payer: Medicare Other | Admitting: Internal Medicine

## 2023-09-15 VITALS — BP 114/70 | HR 60 | Temp 98.0°F | Resp 16 | Ht 71.0 in | Wt 181.0 lb

## 2023-09-15 DIAGNOSIS — M47816 Spondylosis without myelopathy or radiculopathy, lumbar region: Secondary | ICD-10-CM | POA: Diagnosis not present

## 2023-09-15 DIAGNOSIS — M47814 Spondylosis without myelopathy or radiculopathy, thoracic region: Secondary | ICD-10-CM | POA: Diagnosis not present

## 2023-09-15 DIAGNOSIS — R739 Hyperglycemia, unspecified: Secondary | ICD-10-CM | POA: Diagnosis not present

## 2023-09-15 DIAGNOSIS — I723 Aneurysm of iliac artery: Secondary | ICD-10-CM

## 2023-09-15 DIAGNOSIS — E7800 Pure hypercholesterolemia, unspecified: Secondary | ICD-10-CM

## 2023-09-15 DIAGNOSIS — I10 Essential (primary) hypertension: Secondary | ICD-10-CM | POA: Diagnosis not present

## 2023-09-15 DIAGNOSIS — M549 Dorsalgia, unspecified: Secondary | ICD-10-CM

## 2023-09-15 DIAGNOSIS — I2510 Atherosclerotic heart disease of native coronary artery without angina pectoris: Secondary | ICD-10-CM

## 2023-09-15 DIAGNOSIS — I77819 Aortic ectasia, unspecified site: Secondary | ICD-10-CM

## 2023-09-15 DIAGNOSIS — Z8551 Personal history of malignant neoplasm of bladder: Secondary | ICD-10-CM

## 2023-09-15 DIAGNOSIS — I480 Paroxysmal atrial fibrillation: Secondary | ICD-10-CM

## 2023-09-15 DIAGNOSIS — M5136 Other intervertebral disc degeneration, lumbar region with discogenic back pain only: Secondary | ICD-10-CM | POA: Diagnosis not present

## 2023-09-15 DIAGNOSIS — I700 Atherosclerosis of aorta: Secondary | ICD-10-CM

## 2023-09-15 DIAGNOSIS — M4316 Spondylolisthesis, lumbar region: Secondary | ICD-10-CM | POA: Diagnosis not present

## 2023-09-15 MED ORDER — TIZANIDINE HCL 4 MG PO TABS: 4.0000 mg | ORAL_TABLET | Freq: Every evening | ORAL | 0 refills | Status: DC | PRN

## 2023-09-15 NOTE — Patient Instructions (Signed)
 Benefiber daily  Tylenol  arthritis 2 tablets two times per day.

## 2023-09-15 NOTE — Progress Notes (Signed)
 Subjective:    Patient ID: Nathan Watts, male    DOB: 1946-01-27, 78 y.o.   MRN: [id number]  Patient here for  Chief Complaint  Patient presents with   Medical Management of Chronic Issues    HPI Here for a scheduled follow up - follow up regarding hypercholesterolemia, hyperglycemia and hypertension. Evaluated 5/22 - flank pain. Treated with tizanidine , heat/ice. Xray - chest xray - ok. Large amount of stool  - KUB. Has been taking miralax. Having small bowel movements. Still with pain. Certain positions aggravate. Sit - worse. Twisting aggravates. Better lying down. Some change in sensation  - leg - tingling knee. Knee brace helps. Some increased gas. Breathing stable. Eating. Increased stress - wife's health issues.    Past Medical History:  Diagnosis Date   BPH (benign prostatic hyperplasia)    Fatty infiltration of liver    GERD (gastroesophageal reflux disease)    Hypercholesterolemia    Hyperglycemia    Hypertension    OSA on CPAP    mild obstructive sleep apnea with an AHI of 9.6 overall but moderate during REM sleep with a REM AHI of 17.5/h.  On CPAP at 8cm H2O   Thrombocytopenia (HCC)    Thyroid  nodule    Past Surgical History:  Procedure Laterality Date   ATRIAL FIBRILLATION ABLATION N/A 03/26/2022   Procedure: ATRIAL FIBRILLATION ABLATION;  Surgeon: Boyce Byes, MD;  Location: [facility];  Service: Cardiovascular;  Laterality: N/A;   ATRIAL FIBRILLATION ABLATION N/A 04/11/2023   Procedure: ATRIAL FIBRILLATION ABLATION;  Surgeon: Boyce Byes, MD;  Location: [facility];  Service: Cardiovascular;  Laterality: N/A;   CATARACT EXTRACTION W/PHACO Left 04/20/2022   Procedure: CATARACT EXTRACTION PHACO AND INTRAOCULAR LENS PLACEMENT (IOC) LEFT 13.73 01:12.4;  Surgeon: Clair Crews, MD;  Location: [facility];  Service: Ophthalmology;  Laterality: Left;   CATARACT EXTRACTION W/PHACO Right 05/04/2022   Procedure: CATARACT  EXTRACTION PHACO AND INTRAOCULAR LENS PLACEMENT (IOC) RIGHT;  Surgeon: Clair Crews, MD;  Location: [facility];  Service: Ophthalmology;  Laterality: Right;  9.42 0:54.4   COLONOSCOPY WITH PROPOFOL  N/A 10/02/2014   Procedure: COLONOSCOPY WITH PROPOFOL ;  Surgeon: Cassie Click, MD;  Location: [facility];  Service: Endoscopy;  Laterality: N/A;   COLONOSCOPY WITH PROPOFOL  N/A 06/15/2021   Procedure: COLONOSCOPY WITH PROPOFOL ;  Surgeon: Shane Darling, MD;  Location: [facility] ENDOSCOPY;  Service: Endoscopy;  Laterality: N/A;   ESOPHAGOGASTRODUODENOSCOPY (EGD) WITH PROPOFOL  N/A 06/15/2021   Procedure: ESOPHAGOGASTRODUODENOSCOPY (EGD) WITH PROPOFOL ;  Surgeon: Shane Darling, MD;  Location: [facility] ENDOSCOPY;  Service: Endoscopy;  Laterality: N/A;   HOLEP-LASER ENUCLEATION OF THE PROSTATE WITH MORCELLATION N/A 04/24/2021   Procedure: HOLEP-LASER ENUCLEATION OF THE PROSTATE WITH MORCELLATION;  Surgeon: Lawerence Pressman, MD;  Location: [facility] OR[facility];  Service: Urology;  Laterality: N/A;   INGUINAL HERNIA REPAIR     TONSILLECTOMY     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 N/A 04/24/2021   Procedure: 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 ;  Surgeon: Lawerence Pressman, MD;  Location: [facility] OR[facility];  Service: Urology;  Laterality: N/A;   Family History  Problem Relation Age of Onset   Prostate cancer Father    Heart disease Father        s/p CABG   Stroke Mother    Colon cancer Neg Hx    Social History   Socioeconomic History   Marital status: Married    Spouse name: Carles Cheadle   Number  of children: 4   Years of education: Not on file   Highest education level: Bachelor's degree (e.g., BA, AB, BS)  Occupational History   Occupation: retired Dentist  Tobacco Use   Smoking status: Former    Current packs/day: 0.00    Average packs/day: 1 pack/day for 30.0 years (30.0 ttl pk-yrs)    Types: Cigarettes    Start date: 04/12/1968    Quit date: 04/12/1998     Years since quitting: 25.4    Passive exposure: Past   Smokeless tobacco: Never  Vaping Use   Vaping status: Never Used  Substance and Sexual Activity   Alcohol use: Yes    Alcohol/week: 2.0 standard drinks of alcohol    Types: 2 Cans of beer per week    Comment: 1-2 beers 1-2 days per week   Drug use: Never   Sexual activity: Never  Other Topics Concern   Not on file  Social History Narrative   He is married. Has four children   Social Drivers of Corporate investment banker Strain: Low Risk  (09/12/2023)   Overall Financial Resource Strain (CARDIA)    Difficulty of Paying Living Expenses: Not hard at all  Food Insecurity: No Food Insecurity (09/12/2023)   Hunger Vital Sign    Worried About Running Out of Food in the Last Year: Never true    Ran Out of Food in the Last Year: Never true  Transportation Needs: No Transportation Needs (09/12/2023)   PRAPARE - Administrator, Civil Service (Medical): No    Lack of Transportation (Non-Medical): No  Physical Activity: Sufficiently Active (09/12/2023)   Exercise Vital Sign    Days of Exercise per Week: 6 days    Minutes of Exercise per Session: 60 min  Stress: Stress Concern Present (09/12/2023)   [employer] of Occupational Health - Occupational Stress Questionnaire    Feeling of Stress : To some extent  Social Connections: Socially Integrated (09/12/2023)   Social Connection and Isolation Panel [NHANES]    Frequency of Communication with Friends and Family: More than three times a week    Frequency of Social Gatherings with Friends and Family: More than three times a week    Attends Religious Services: More than 4 times per year    Active Member of [employer] or Organizations: Yes    Attends Engineer, structural: More than 4 times per year    Marital Status: Married     Review of Systems  Constitutional:  Negative for appetite change and unexpected weight change.  HENT:  Negative for congestion and sinus  pressure.   Respiratory:  Negative for cough, chest tightness and shortness of breath.   Cardiovascular:  Negative for chest pain, palpitations and leg swelling.  Gastrointestinal:  Negative for nausea and vomiting.       Some lower abdominal pressure.   Genitourinary:  Negative for difficulty urinating and dysuria.  Musculoskeletal:  Negative for joint swelling and myalgias.       Flank pain.   Skin:  Negative for color change and rash.  Neurological:  Negative for dizziness and headaches.  Psychiatric/Behavioral:  Negative for agitation and dysphoric mood.        Objective:     BP 114/70   Pulse 60   Temp 98 F (36.7 C)   Resp 16   Ht 5\' 11"  (1.803 m)   Wt 181 lb (82.1 kg)   SpO2 98%   BMI 25.24 kg/m  Wt Readings from Last 3 Encounters:  09/15/23 181 lb (82.1 kg)  08/11/23 178 lb (80.7 kg)  08/08/23 180 lb (81.6 kg)    Physical Exam Vitals reviewed.  Constitutional:      General: He is not in acute distress.    Appearance: Normal appearance. He is well-developed.  HENT:     Head: Normocephalic and atraumatic.     Right Ear: External ear normal.     Left Ear: External ear normal.     Mouth/Throat:     Pharynx: No oropharyngeal exudate or posterior oropharyngeal erythema.  Eyes:     General: No scleral icterus.       Right eye: No discharge.        Left eye: No discharge.     Conjunctiva/sclera: Conjunctivae normal.  Cardiovascular:     Rate and Rhythm: Normal rate and regular rhythm.  Pulmonary:     Effort: Pulmonary effort is normal. No respiratory distress.     Breath sounds: Normal breath sounds.  Abdominal:     General: Bowel sounds are normal.     Palpations: Abdomen is soft.     Tenderness: There is no abdominal tenderness.  Musculoskeletal:        General: No swelling or tenderness.     Cervical back: Neck supple. No tenderness.     Comments: Increased pain with twisting. Increased pain - with going from lying to sitting position.    Lymphadenopathy:     Cervical: No cervical adenopathy.  Skin:    Findings: No erythema or rash.  Neurological:     Mental Status: He is alert.  Psychiatric:        Mood and Affect: Mood normal.        Behavior: Behavior normal.         Outpatient Encounter Medications as of 09/15/2023  Medication Sig   atorvastatin  (LIPITOR) 20 MG tablet Take 1 tablet (20 mg total) by mouth daily.   dextromethorphan-guaiFENesin (MUCINEX DM) 30-600 MG 12hr tablet Take 1 tablet by mouth 2 (two) times daily as needed (congestion).   ELIQUIS  5 MG TABS tablet TAKE 1 TABLET BY MOUTH TWICE DAILY   metroNIDAZOLE (METROGEL) 0.75 % gel Apply 1 application  topically 2 (two) times daily. to face   Multiple Vitamin (MULTIVITAMIN WITH MINERALS) TABS tablet Take 1 tablet by mouth in the morning.   Omega-3 Fatty Acids (FISH OIL) 1200 MG CAPS Take 1,200 mg by mouth in the morning.   omeprazole (PRILOSEC OTC) 20 MG tablet Take 20 mg by mouth every evening.   pantoprazole  (PROTONIX ) 40 MG tablet Take 1 tablet (40 mg total) by mouth daily.   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   Probiotic Product (PROBIOTIC PO) Take 1 capsule by mouth in the morning.   Psyllium (METAMUCIL PO) Take 1 Scoop by mouth in the morning. Metamucil Fiber 1 scoop = teaspoonful   sodium chloride  (OCEAN) 0.65 % SOLN nasal spray Place 1 spray into both nostrils as needed for congestion.   [DISCONTINUED] cefdinir  (OMNICEF ) 300 MG capsule Take 1 capsule (300 mg total) by mouth 2 (two) times daily.   [DISCONTINUED] cetirizine  (ZYRTEC ) 5 MG tablet Take 1 tablet (5 mg total) by mouth daily.   [DISCONTINUED] fluticasone  (FLONASE ) 50 MCG/ACT nasal spray Place 2 sprays into both nostrils daily.   [DISCONTINUED] predniSONE  (DELTASONE ) 10 MG tablet Take 6 tablets x 1 day and then decrease by 1/2 tablet per day until  down to zero mg.   No facility-administered encounter medications on file as of 09/15/2023.      Lab Results  Component Value Date   WBC 6.4 09/12/2023   HGB 13.3 09/12/2023   HCT 40.1 09/12/2023   PLT 188.0 09/12/2023   GLUCOSE 102 (H) 09/12/2023   CHOL 139 09/12/2023   TRIG 68.0 09/12/2023   HDL 50.20 09/12/2023   LDLCALC 75 09/12/2023   ALT 15 09/12/2023   AST 19 09/12/2023   NA 138 09/12/2023   K 4.3 09/12/2023   CL 104 09/12/2023   CREATININE 1.10 09/12/2023   BUN 15 09/12/2023   CO2 28 09/12/2023   TSH 1.33 03/18/2023   PSA 0.32 03/18/2023   HGBA1C 6.0 09/12/2023    DG Chest 2 View Result Date: 08/11/2023 CLINICAL DATA:  Cough, chest congestion, and body aches for 5 days. EXAM: CHEST - 2 VIEW COMPARISON:  11/22/2022 FINDINGS: The heart size and mediastinal contours are within normal limits. No evidence of pulmonary infiltrate or pleural effusion. A tiny nodular opacity is seen in the lateral right lung base on the PA view, which may represent a summation shadow or pulmonary nodule. No pleural effusion. Several old left upper rib fracture deformities are again noted. IMPRESSION: Tiny nodular opacity in lateral right lung base, which may represent a summation shadow or pulmonary nodule. Recommend further evaluation with chest CT without contrast. These results will be called to the ordering clinician or representative by the Radiologist Assistant, and communication documented in the PACS or [employer]. Electronically Signed   By: Marlyce Sine M.D.   On: 08/11/2023 15:31       Assessment & Plan:  Right-sided back pain, unspecified back location, unspecified chronicity Assessment & Plan: Flank pain as outlined. Appears to be more msk. Aggravated by certain movements and positions. Recent evaluation ACC. Urine ok. Place on MR. Taking miralax. Bowels moving. Check back xray. Continue MR. Further w/up pending results. Also with some increased discomfort - suprapubic region. Recent urine ok. Labs reviewed. If persistent pain, consider CT scan.   Orders: -     DG  Lumbar Spine 2-3 Views; Future -     DG Thoracic Spine 2 View; Future  Hypercholesterolemia Assessment & Plan: On lipitor.  Low cholesterol diet and exercise.  Follow lipid panel and liver function tests.   Orders: -     Lipid panel; Future -     Hepatic function panel; Future  Hyperglycemia Assessment & Plan: Low carb diet and exercise. Follow met b and A1c.   Orders: -     Hemoglobin A1c; Future  Hypertension, essential Assessment & Plan: Blood pressure has done well on no medication. Cotinue to follow pressures. Follow metabolic panel.   Orders: -     Basic metabolic panel with GFR; Future  Aortic atherosclerosis (HCC) Assessment & Plan: Continue lipitor.    Common iliac aneurysm [facility]) Assessment & Plan: Evaluated [facility] 10/2022  -  His noninvasive studies today demonstrate stable sizes with right common iliac artery measuring 1.4 cm and left common iliac artery measuring 1.6 cm. The abdominal aorta has a maximal diameter of 3 cm. 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 No role for intervention at this size. This can continue to be checked every other year.    Coronary artery disease involving native coronary artery of native heart without angina pectoris Assessment & Plan: Continue risk factor modification. Stable. No chest pain.  Dilation of aorta [facility]) Assessment & Plan: Cardiology 05/09/23 - CT - 03/2023 - Aneurysmal dilatation of the ascending aorta, 4.2 cm, previously 4.1 cm. Recommend annual imaging followup by CTA or MRA    History of bladder cancer Assessment & Plan: Dr Estanislao Heimlich 12/03/21 - cystoscopy - no evidence of recurrence. Plan f/u cystoscopy in 6 months. F/u cystoscopy 06/10/22 - no evidence of recurrence. F/u cystoscopy in 6 months. Cystoscopy 12/09/22 - no evidence of recurrence. Plan f/u cystoscopy in one year.    Paroxysmal atrial fibrillation (HCC) Assessment & Plan: Continue eliquis .        Dellar Fenton, MD

## 2023-09-16 ENCOUNTER — Ambulatory Visit: Payer: Self-pay | Admitting: Internal Medicine

## 2023-09-16 ENCOUNTER — Other Ambulatory Visit: Payer: Self-pay | Admitting: Internal Medicine

## 2023-09-16 DIAGNOSIS — M549 Dorsalgia, unspecified: Secondary | ICD-10-CM

## 2023-09-16 NOTE — Progress Notes (Signed)
Order placed for ortho referral.   

## 2023-09-19 ENCOUNTER — Other Ambulatory Visit: Payer: Self-pay | Admitting: Internal Medicine

## 2023-09-19 ENCOUNTER — Encounter: Payer: Self-pay | Admitting: Internal Medicine

## 2023-09-19 ENCOUNTER — Telehealth: Payer: Self-pay | Admitting: Internal Medicine

## 2023-09-19 NOTE — Assessment & Plan Note (Signed)
 On lipitor.  Low cholesterol diet and exercise.  Follow lipid panel and liver function tests.

## 2023-09-19 NOTE — Assessment & Plan Note (Signed)
 Continue risk factor modification. Stable. No chest pain.

## 2023-09-19 NOTE — Assessment & Plan Note (Signed)
 Continue lipitor  ?

## 2023-09-19 NOTE — Assessment & Plan Note (Signed)
 Dr Estanislao Heimlich 12/03/21 - cystoscopy - no evidence of recurrence. Plan f/u cystoscopy in 6 months. F/u cystoscopy 06/10/22 - no evidence of recurrence. F/u cystoscopy in 6 months. Cystoscopy 12/09/22 - no evidence of recurrence. Plan f/u cystoscopy in one year.

## 2023-09-19 NOTE — Assessment & Plan Note (Signed)
 Cardiology 05/09/23 - CT - 03/2023 - Aneurysmal dilatation of the ascending aorta, 4.2 cm, previously 4.1 cm. Recommend annual imaging followup by CTA or MRA

## 2023-09-19 NOTE — Assessment & Plan Note (Signed)
 Low-carb diet and exercise.  Follow met b and A1c.

## 2023-09-19 NOTE — Assessment & Plan Note (Signed)
 Continue eliquis  ?

## 2023-09-19 NOTE — Assessment & Plan Note (Addendum)
 Flank pain as outlined. Appears to be more msk. Aggravated by certain movements and positions. Recent evaluation ACC. Urine ok. Place on MR. Taking miralax. Bowels moving. Check back xray. Continue MR. Further w/up pending results. Also with some increased discomfort - suprapubic region. Recent urine ok. Labs reviewed. If persistent pain, consider CT scan.

## 2023-09-19 NOTE — Assessment & Plan Note (Signed)
 Evaluated [facility] 10/2022  -  His noninvasive studies today demonstrate stable sizes with right common iliac artery measuring 1.4 cm and left common iliac artery measuring 1.6 cm. The abdominal aorta has a maximal diameter of 3 cm. 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 No role for intervention at this size. This can continue to be checked every other year.

## 2023-09-19 NOTE — Assessment & Plan Note (Signed)
 Blood pressure has done well on no medication. Cotinue to follow pressures. Follow metabolic panel.

## 2023-09-19 NOTE — Telephone Encounter (Signed)
 I saw him in clinic recently. He was having flank pain. Also noted on exam - some lower abdominal discomfort. Please call and see how he is feeling pain any better? How are bowels doing?  Is he having persistent lower abdominal pain. If so, I would like to check a CT scan to further evaluate.

## 2023-09-20 ENCOUNTER — Other Ambulatory Visit: Payer: Self-pay | Admitting: Internal Medicine

## 2023-09-20 DIAGNOSIS — R1030 Lower abdominal pain, unspecified: Secondary | ICD-10-CM

## 2023-09-20 NOTE — Progress Notes (Signed)
Order placed for CT abdomen and pelvis.  

## 2023-09-20 NOTE — Telephone Encounter (Signed)
Order placed for CT abd/pelvis

## 2023-09-20 NOTE — Telephone Encounter (Signed)
 He says everything is about the same no worse but not any better.Nathan Watts He would like to have the CT scan done.

## 2023-09-23 ENCOUNTER — Encounter: Payer: Self-pay | Admitting: Internal Medicine

## 2023-09-23 NOTE — Telephone Encounter (Signed)
 Can we please follow up on his CT Scan?

## 2023-09-28 ENCOUNTER — Ambulatory Visit
Admission: RE | Admit: 2023-09-28 | Discharge: 2023-09-28 | Disposition: A | Discharge status: Home / Self Care | Source: Ambulatory Visit | Attending: Internal Medicine | Admitting: Internal Medicine

## 2023-09-28 DIAGNOSIS — R1030 Lower abdominal pain, unspecified: Secondary | ICD-10-CM | POA: Diagnosis not present

## 2023-09-28 DIAGNOSIS — K449 Diaphragmatic hernia without obstruction or gangrene: Secondary | ICD-10-CM | POA: Diagnosis not present

## 2023-09-28 DIAGNOSIS — C679 Malignant neoplasm of bladder, unspecified: Secondary | ICD-10-CM | POA: Diagnosis not present

## 2023-09-28 DIAGNOSIS — K5730 Diverticulosis of large intestine without perforation or abscess without bleeding: Secondary | ICD-10-CM | POA: Diagnosis not present

## 2023-09-28 DIAGNOSIS — I7143 Infrarenal abdominal aortic aneurysm, without rupture: Secondary | ICD-10-CM | POA: Diagnosis not present

## 2023-10-03 DIAGNOSIS — I10 Essential (primary) hypertension: Secondary | ICD-10-CM | POA: Diagnosis not present

## 2023-10-03 DIAGNOSIS — G4733 Obstructive sleep apnea (adult) (pediatric): Secondary | ICD-10-CM | POA: Diagnosis not present

## 2023-10-04 ENCOUNTER — Encounter: Payer: Self-pay | Admitting: Internal Medicine

## 2023-10-04 NOTE — Telephone Encounter (Signed)
 Advised patient that is has been taking a few weeks for scans to come back.

## 2023-10-13 ENCOUNTER — Ambulatory Visit: Payer: Self-pay | Admitting: Internal Medicine

## 2023-10-17 DIAGNOSIS — M4316 Spondylolisthesis, lumbar region: Secondary | ICD-10-CM | POA: Diagnosis not present

## 2023-10-17 DIAGNOSIS — M47816 Spondylosis without myelopathy or radiculopathy, lumbar region: Secondary | ICD-10-CM | POA: Diagnosis not present

## 2023-10-18 DIAGNOSIS — K080 Exfoliation of teeth due to systemic causes: Secondary | ICD-10-CM | POA: Diagnosis not present

## 2023-10-25 ENCOUNTER — Encounter (INDEPENDENT_AMBULATORY_CARE_PROVIDER_SITE_OTHER): Payer: Self-pay | Admitting: Vascular Surgery

## 2023-10-25 ENCOUNTER — Ambulatory Visit (INDEPENDENT_AMBULATORY_CARE_PROVIDER_SITE_OTHER): Admitting: Vascular Surgery

## 2023-10-25 VITALS — BP 138/81 | HR 55 | Ht 71.0 in | Wt 182.5 lb

## 2023-10-25 DIAGNOSIS — I7143 Infrarenal abdominal aortic aneurysm, without rupture: Secondary | ICD-10-CM | POA: Diagnosis not present

## 2023-10-25 DIAGNOSIS — I723 Aneurysm of iliac artery: Secondary | ICD-10-CM

## 2023-10-25 DIAGNOSIS — I714 Abdominal aortic aneurysm, without rupture, unspecified: Secondary | ICD-10-CM | POA: Insufficient documentation

## 2023-10-25 DIAGNOSIS — E7800 Pure hypercholesterolemia, unspecified: Secondary | ICD-10-CM | POA: Diagnosis not present

## 2023-10-25 DIAGNOSIS — I10 Essential (primary) hypertension: Secondary | ICD-10-CM

## 2023-10-25 NOTE — Progress Notes (Signed)
 MRN : [id number]  Nathan Watts is a 78 y.o. (February 21, 1946) male who presents with chief complaint of  Chief Complaint  Patient presents with   fu see JD CT done 09/28/23. Aneurysm 3.1 cm, 1.2 cm- per cal  .  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  He underwent a CT scan about a month ago which I have independently reviewed.  This was a 5 mm cut CT scan and not a CT angiogram, there had been some enlargement of his distal abdominal aortic aneurysm.  This is somewhat saccular appearance and has a maximal diameter of 3.1 cm.  His iliac arteries measured about 16 to 17 mm in the common iliac arteries bilaterally.  This was a slight increase from his studies previously.  He does not have any aneurysm related symptoms. Specifically, the patient denies new back or abdominal pain, or signs of peripheral embolization   Current Outpatient Medications  Medication Sig Dispense Refill   atorvastatin  (LIPITOR) 20 MG tablet TAKE ONE TABLET BY MOUTH DAILY 90 tablet 3   dextromethorphan-guaiFENesin (MUCINEX DM) 30-600 MG 12hr tablet Take 1 tablet by mouth 2 (two) times daily as needed (congestion).     ELIQUIS  5 MG TABS tablet TAKE 1 TABLET BY MOUTH TWICE DAILY 60 tablet 5   metroNIDAZOLE (METROGEL) 0.75 % gel Apply 1 application  topically 2 (two) times daily. to face     Multiple Vitamin (MULTIVITAMIN WITH MINERALS) TABS tablet Take 1 tablet by mouth in the morning.     Omega-3 Fatty Acids (FISH OIL) 1200 MG CAPS Take 1,200 mg by mouth in the morning.     omeprazole (PRILOSEC OTC) 20 MG tablet Take 20 mg by mouth every evening.     pantoprazole  (PROTONIX ) 40 MG tablet Take 1 tablet (40 mg total) by mouth daily. 45 tablet 0   pimecrolimus (ELIDEL) 1 % cream Apply 1 application  topically daily as needed (rosacea).     Probiotic Product (PROBIOTIC PO) Take 1 capsule by mouth in the morning.     Psyllium  (METAMUCIL PO) Take 1 Scoop by mouth in the morning. Metamucil Fiber 1 scoop = teaspoonful     sodium chloride  (OCEAN) 0.65 % SOLN nasal spray Place 1 spray into both nostrils as needed for congestion. 30 mL 2   tiZANidine  (ZANAFLEX ) 4 MG tablet Take 1 tablet (4 mg total) by mouth at bedtime as needed for muscle spasms. 30 tablet 0   No current facility-administered medications for this visit.    Past Medical History:  Diagnosis Date   BPH (benign prostatic hyperplasia)    Fatty infiltration of liver    GERD (gastroesophageal reflux disease)    Hypercholesterolemia    Hyperglycemia    Hypertension    OSA on CPAP    mild obstructive sleep apnea with an AHI of 9.6 overall but moderate during REM sleep with a REM AHI of 17.5/h.  On CPAP at 8cm H2O   Thrombocytopenia (HCC)    Thyroid  nodule     Past Surgical History:  Procedure Laterality Date   ATRIAL FIBRILLATION ABLATION N/A 03/26/2022   Procedure: ATRIAL FIBRILLATION ABLATION;  Surgeon: Cindie Ole DASEN, MD;  Location: [facility];  Service: Cardiovascular;  Laterality: N/A;   ATRIAL FIBRILLATION ABLATION N/A 04/11/2023   Procedure: ATRIAL FIBRILLATION ABLATION;  Surgeon: Cindie Ole DASEN, MD;  Location: [facility];  Service: Cardiovascular;  Laterality: N/A;   CATARACT EXTRACTION W/PHACO Left 04/20/2022   Procedure: CATARACT EXTRACTION PHACO AND INTRAOCULAR LENS PLACEMENT (IOC) LEFT 13.73 01:12.4;  Surgeon: Jaye Fallow, MD;  Location: [facility];  Service: Ophthalmology;  Laterality: Left;   CATARACT EXTRACTION W/PHACO Right 05/04/2022   Procedure: CATARACT EXTRACTION PHACO AND INTRAOCULAR LENS PLACEMENT (IOC) RIGHT;  Surgeon: Jaye Fallow, MD;  Location: [facility];  Service: Ophthalmology;  Laterality: Right;  9.42 0:54.4   COLONOSCOPY WITH PROPOFOL  N/A 10/02/2014   Procedure: COLONOSCOPY WITH PROPOFOL ;  Surgeon: Lamar ONEIDA Holmes, MD;  Location: [facility];  Service: Endoscopy;   Laterality: N/A;   COLONOSCOPY WITH PROPOFOL  N/A 06/15/2021   Procedure: COLONOSCOPY WITH PROPOFOL ;  Surgeon: Maryruth Ole ONEIDA, MD;  Location: [facility];  Service: Endoscopy;  Laterality: N/A;   ESOPHAGOGASTRODUODENOSCOPY (EGD) WITH PROPOFOL  N/A 06/15/2021   Procedure: ESOPHAGOGASTRODUODENOSCOPY (EGD) WITH PROPOFOL ;  Surgeon: Maryruth Ole ONEIDA, MD;  Location: [facility];  Service: Endoscopy;  Laterality: N/A;   HOLEP-LASER ENUCLEATION OF THE PROSTATE WITH MORCELLATION N/A 04/24/2021   Procedure: HOLEP-LASER ENUCLEATION OF THE PROSTATE WITH MORCELLATION;  Surgeon: Francisca Redell BROCKS, MD;  Location: [facility];  Service: Urology;  Laterality: N/A;   INGUINAL HERNIA REPAIR     TONSILLECTOMY     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 N/A 04/24/2021   Procedure: 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 ;  Surgeon: Francisca Redell BROCKS, MD;  Location: [facility];  Service: Urology;  Laterality: N/A;     Social History   Tobacco Use   Smoking status: Former    Current packs/day: 0.00    Average packs/day: 1 pack/day for 30.0 years (30.0 ttl pk-yrs)    Types: Cigarettes    Start date: 04/12/1968    Quit date: 04/12/1998    Years since quitting: 25.5    Passive exposure: Past   Smokeless tobacco: Never  Vaping Use   Vaping status: Never Used  Substance Use Topics   Alcohol use: Yes    Alcohol/week: 2.0 standard drinks of alcohol    Types: 2 Cans of beer per week    Comment: 1-2 beers 1-2 days per week   Drug use: Never      Family History  Problem Relation Age of Onset   Prostate cancer Father    Heart disease Father        s/p CABG   Stroke Mother    Colon cancer Neg Hx      Allergies  Allergen Reactions   Crestor [Rosuvastatin] Other (See Comments)    Causes GI upset and anxiety   Hydrocodone -Chlorpheniramine Other (See Comments)    possible causes anxiety Did ok with low dose     REVIEW OF SYSTEMS (Negative unless  checked)  Constitutional: [] Weight loss  [] Fever  [] Chills Cardiac: [] Chest pain   [] Chest pressure   [] Palpitations   [] Shortness of breath when laying flat   [] Shortness of breath at rest   [] Shortness of breath with exertion. Vascular:  [] Pain in legs with walking   [] Pain in legs at rest   [] Pain in legs when laying flat   [] Claudication   [] Pain in feet when walking  [] Pain in feet at rest  [] Pain in feet when laying flat   [] History of DVT   [] Phlebitis   [] Swelling in legs   [] Varicose veins   [] Non-healing ulcers Pulmonary:   [] Uses home oxygen   [] Productive cough   [] Hemoptysis   [] Wheeze  [] COPD   [] Asthma Neurologic:  [] Dizziness  []   Blackouts   [] Seizures   [] History of stroke   [] History of TIA  [] Aphasia   [] Temporary blindness   [] Dysphagia   [] Weakness or numbness in arms   [] Weakness or numbness in legs Musculoskeletal:  [] Arthritis   [] Joint swelling   [] Joint pain   [] Low back pain Hematologic:  [] Easy bruising  [] Easy bleeding   [] Hypercoagulable state   [x] Anemic   Gastrointestinal:  [] Blood in stool   [] Vomiting blood  [x] Gastroesophageal reflux/heartburn   [] Abdominal pain Genitourinary:  [] Chronic kidney disease   [] Difficult urination  [] Frequent urination  [] Burning with urination   [] Hematuria Skin:  [] Rashes   [] Ulcers   [] Wounds Psychological:  [] History of anxiety   []  History of major depression.  Physical Examination  BP 138/81   Pulse (!) 55   Ht 5' 11 (1.803 m)   Wt 182 lb 8 oz (82.8 kg)   BMI 25.45 kg/m  Gen:  WD/WN, NAD. Appears younger than stated age. Head: [place]/AT, No temporalis wasting. Ear/Nose/Throat: Hearing grossly intact, nares w/o erythema or drainage Eyes: Conjunctiva clear. Sclera non-icteric Neck: Supple.  Trachea midline Pulmonary:  Good air movement, no use of accessory muscles.  Cardiac: bradycardic Vascular:  Vessel Right Left  Radial Palpable Palpable                          PT Palpable Palpable  DP Palpable Palpable    Gastrointestinal: soft, non-tender/non-distended. No guarding/reflex.  Musculoskeletal: M/S 5/5 throughout.  No deformity or atrophy. No edema. Neurologic: Sensation grossly intact in extremities.  Symmetrical.  Speech is fluent.  Psychiatric: Judgment intact, Mood & affect appropriate for pt's clinical situation. Dermatologic: No rashes or ulcers noted.  No cellulitis or open wounds.      Labs Recent Results (from the past 2160 hours)  POC COVID-19     Status: None   Collection Time: 08/08/23  9:40 AM  Result Value Ref Range   SARS Coronavirus 2 Ag Negative Negative  POCT Influenza A/B     Status: None   Collection Time: 08/08/23  9:40 AM  Result Value Ref Range   Influenza A, POC Negative Negative   Influenza B, POC Negative Negative  CBC with Differential/Platelet     Status: Abnormal   Collection Time: 09/12/23  7:36 AM  Result Value Ref Range   WBC 6.4 4.0 - 10.5 K/uL   RBC 4.52 4.22 - 5.81 Mil/uL   Hemoglobin 13.3 13.0 - 17.0 g/dL   HCT 59.8 60.9 - 47.9 %   MCV 88.7 78.0 - 100.0 fl   MCHC 33.1 30.0 - 36.0 g/dL   RDW 85.3 88.4 - 84.4 %   Platelets 188.0 150.0 - 400.0 K/uL   Neutrophils Relative % 53.8 43.0 - 77.0 %   Lymphocytes Relative 28.6 12.0 - 46.0 %   Monocytes Relative 12.1 (H) 3.0 - 12.0 %   Eosinophils Relative 4.9 0.0 - 5.0 %   Basophils Relative 0.6 0.0 - 3.0 %   Neutro Abs 3.4 1.4 - 7.7 K/uL   Lymphs Abs 1.8 0.7 - 4.0 K/uL   Monocytes Absolute 0.8 0.1 - 1.0 K/uL   Eosinophils Absolute 0.3 0.0 - 0.7 K/uL   Basophils Absolute 0.0 0.0 - 0.1 K/uL  Basic metabolic panel     Status: Abnormal   Collection Time: 09/12/23  7:36 AM  Result Value Ref Range   Sodium 138 135 - 145 mEq/L   Potassium 4.3 3.5 - 5.1 mEq/L  Chloride 104 96 - 112 mEq/L   CO2 28 19 - 32 mEq/L   Glucose, Bld 102 (H) 70 - 99 mg/dL   BUN 15 6 - 23 mg/dL   Creatinine, Ser 8.89 0.40 - 1.50 mg/dL   GFR 35.92 >39.99 mL/min    Comment: Calculated using the CKD-EPI Creatinine Equation  (2021)   Calcium  9.1 8.4 - 10.5 mg/dL  Hemoglobin J8r     Status: None   Collection Time: 09/12/23  2:58 PM  Result Value Ref Range   Hgb A1c MFr Bld 6.0 4.6 - 6.5 %    Comment: Glycemic Control Guidelines for People with Diabetes:Non Diabetic:  <6%Goal of Therapy: <7%Additional Action Suggested:  >8%   Hepatic function panel     Status: None   Collection Time: 09/12/23  2:58 PM  Result Value Ref Range   Total Bilirubin 0.5 0.2 - 1.2 mg/dL   Bilirubin, Direct 0.1 0.0 - 0.3 mg/dL   Alkaline Phosphatase 56 39 - 117 U/L   AST 19 0 - 37 U/L   ALT 15 0 - 53 U/L   Total Protein 6.8 6.0 - 8.3 g/dL   Albumin 4.0 3.5 - 5.2 g/dL  Lipid panel     Status: None   Collection Time: 09/12/23  2:58 PM  Result Value Ref Range   Cholesterol 139 0 - 200 mg/dL    Comment: ATP III Classification       Desirable:  < 200 mg/dL               Borderline High:  200 - 239 mg/dL          High:  > = 759 mg/dL   Triglycerides 31.9 0.0 - 149.0 mg/dL    Comment: Normal:  <849 mg/dLBorderline High:  150 - 199 mg/dL   HDL 49.79 >60.99 mg/dL   VLDL 86.3 0.0 - 59.9 mg/dL   LDL Cholesterol 75 0 - 99 mg/dL   Total CHOL/HDL Ratio 3     Comment:                Men          Women1/2 Average Risk     3.4          3.3Average Risk          5.0          4.42X Average Risk          9.6          7.13X Average Risk          15.0          11.0                       NonHDL 89.06     Comment: NOTE:  Non-HDL goal should be 30 mg/dL higher than patient's LDL goal (i.e. 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   On: 10/11/2023 16:04   Result Date: 10/11/2023 CLINICAL DATA:  Abdominal pain, acute (Ped 0-17y) mid Right flank pain and back pain  x 1 month. NKI Hx Bladder CA with TURB in 2023. Hx Left Inguinal Hernia repair. EXAM: CT ABDOMEN AND PELVIS WITHOUT  CONTRAST TECHNIQUE: Multidetector CT imaging of the abdomen and pelvis was performed following the standard protocol without IV contrast. RADIATION DOSE REDUCTION: This exam was performed according to the departmental dose-optimization program which includes automated exposure control, adjustment of the mA and/or kV according to patient size and/or use of iterative reconstruction technique. COMPARISON:  CT abdomen pelvis 03/07/2015 FINDINGS: Lower chest: Coronary calcification.  Tiny hiatal hernia. Hepatobiliary: Fluid density lesion of the right posterior hepatic lobe. No gallstones, gallbladder wall thickening, or pericholecystic fluid. No biliary dilatation. Pancreas: No focal lesion. Normal pancreatic contour. No surrounding inflammatory changes. No main pancreatic ductal dilatation. Spleen: Normal in size without focal abnormality. Adrenals/Urinary Tract: No adrenal nodule bilaterally. No nephrolithiasis and no hydronephrosis. No definite contour-deforming renal mass. No ureterolithiasis or hydroureter. The urinary bladder is unremarkable. Stomach/Bowel: Stomach is within normal limits. No evidence of bowel wall thickening or dilatation. Colonic diverticulosis. Stool throughout the colon. Appendix appears normal. Vascular/Lymphatic: Interval increase in size of an distal infrarenal abdominal aorta (at the level of the bifurcation) saccular aneurysm measuring 1.2 x 2.1 cm (from 0.9 x 1.6 cm). Associated aneurysmal dilatation of the infrarenal abdominal aorta measuring up to 3.1 cm. Atherosclerotic plaque of the aorta and its branches. No abdominal, pelvic, or inguinal lymphadenopathy. Reproductive: Question TURP procedure. Prostate is otherwise unremarkable Other: No intraperitoneal free fluid. No intraperitoneal free gas. No organized fluid collection. Musculoskeletal: No abdominal wall hernia or  abnormality. No suspicious lytic or blastic osseous lesions. No acute displaced fracture. Grade 1 anterolisthesis of L4 on L5. IMPRESSION: 1. Interval increase in size of an distal infrarenal abdominal aorta saccular aneurysm measuring 1.2 x 2.1 cm (from 0.9 x 1.6 cm). Associated aneurysmal dilatation of the infrarenal abdominal aorta measuring up to 3.1 cm just proximal to the aortic bifurcation. Recommend referral to or continued care with vascular specialist. (Ref.: J Vasc Surg. 2018; 67:2-77 and J Am Coll Radiol 2013;10(10):789-794.). Aortic aneurysm NOS (ICD10-I71.9). 2.  Aortic Atherosclerosis (ICD10-I70.0). 3. Colonic diverticulosis with no acute diverticulitis. 4. Otherwise no acute intra-abdominal or intrapelvic abnormality with limited evaluation on this noncontrast study. Electronically Signed: By: Morgane  Naveau M.D. On: 10/05/2023 22:40    Assessment/Plan  No problem-specific Assessment & Plan notes found for this encounter.  Hypertension, essential blood pressure control important in reducing the progression of atherosclerotic disease and aneurysmal disease     Hypercholesterolemia lipid control important in reducing the progression of atherosclerotic disease. Continue statin therapy  Selinda Gu, MD  10/25/2023 11:58 AM    This note was created with Dragon medical transcription system.  Any errors from dictation are purely unintentional

## 2023-10-25 NOTE — Assessment & Plan Note (Signed)
 He underwent a CT scan about a month ago which I have independently reviewed.  This was a 5 mm cut CT scan and not a CT angiogram, there had been some enlargement of his distal abdominal aortic aneurysm.  This is somewhat saccular appearance and has a maximal diameter of 3.1 cm.  His iliac arteries measured about 16 to 17 mm in the common iliac arteries bilaterally.  This was a slight increase from his studies previously. No role for intervention at this size.  With some increased from previous, I will shorten his follow-up and do a 60-month duplex in the office.  Contact our office with any worrisome symptoms in the interim.

## 2023-10-26 ENCOUNTER — Encounter: Payer: Self-pay | Admitting: Urology

## 2023-10-31 DIAGNOSIS — M47816 Spondylosis without myelopathy or radiculopathy, lumbar region: Secondary | ICD-10-CM | POA: Diagnosis not present

## 2023-10-31 DIAGNOSIS — M5380 Other specified dorsopathies, site unspecified: Secondary | ICD-10-CM | POA: Diagnosis not present

## 2023-11-04 DIAGNOSIS — M47816 Spondylosis without myelopathy or radiculopathy, lumbar region: Secondary | ICD-10-CM | POA: Diagnosis not present

## 2023-11-04 DIAGNOSIS — M5386 Other specified dorsopathies, lumbar region: Secondary | ICD-10-CM | POA: Diagnosis not present

## 2023-11-04 DIAGNOSIS — M6281 Muscle weakness (generalized): Secondary | ICD-10-CM | POA: Diagnosis not present

## 2023-11-07 DIAGNOSIS — M47816 Spondylosis without myelopathy or radiculopathy, lumbar region: Secondary | ICD-10-CM | POA: Diagnosis not present

## 2023-11-07 DIAGNOSIS — M5386 Other specified dorsopathies, lumbar region: Secondary | ICD-10-CM | POA: Diagnosis not present

## 2023-11-07 DIAGNOSIS — M6281 Muscle weakness (generalized): Secondary | ICD-10-CM | POA: Diagnosis not present

## 2023-11-11 ENCOUNTER — Ambulatory Visit: Admitting: Internal Medicine

## 2023-11-11 DIAGNOSIS — M5386 Other specified dorsopathies, lumbar region: Secondary | ICD-10-CM | POA: Diagnosis not present

## 2023-11-11 DIAGNOSIS — M6281 Muscle weakness (generalized): Secondary | ICD-10-CM | POA: Diagnosis not present

## 2023-11-11 DIAGNOSIS — M47816 Spondylosis without myelopathy or radiculopathy, lumbar region: Secondary | ICD-10-CM | POA: Diagnosis not present

## 2023-11-15 DIAGNOSIS — M6281 Muscle weakness (generalized): Secondary | ICD-10-CM | POA: Diagnosis not present

## 2023-11-15 DIAGNOSIS — M47816 Spondylosis without myelopathy or radiculopathy, lumbar region: Secondary | ICD-10-CM | POA: Diagnosis not present

## 2023-11-15 DIAGNOSIS — M5386 Other specified dorsopathies, lumbar region: Secondary | ICD-10-CM | POA: Diagnosis not present

## 2023-11-17 DIAGNOSIS — M6281 Muscle weakness (generalized): Secondary | ICD-10-CM | POA: Diagnosis not present

## 2023-11-17 DIAGNOSIS — M5386 Other specified dorsopathies, lumbar region: Secondary | ICD-10-CM | POA: Diagnosis not present

## 2023-11-17 DIAGNOSIS — M47816 Spondylosis without myelopathy or radiculopathy, lumbar region: Secondary | ICD-10-CM | POA: Diagnosis not present

## 2023-11-22 DIAGNOSIS — M5386 Other specified dorsopathies, lumbar region: Secondary | ICD-10-CM | POA: Diagnosis not present

## 2023-11-22 DIAGNOSIS — M6281 Muscle weakness (generalized): Secondary | ICD-10-CM | POA: Diagnosis not present

## 2023-11-22 DIAGNOSIS — M47816 Spondylosis without myelopathy or radiculopathy, lumbar region: Secondary | ICD-10-CM | POA: Diagnosis not present

## 2023-11-24 DIAGNOSIS — M47816 Spondylosis without myelopathy or radiculopathy, lumbar region: Secondary | ICD-10-CM | POA: Diagnosis not present

## 2023-11-24 DIAGNOSIS — M5386 Other specified dorsopathies, lumbar region: Secondary | ICD-10-CM | POA: Diagnosis not present

## 2023-11-24 DIAGNOSIS — M6281 Muscle weakness (generalized): Secondary | ICD-10-CM | POA: Diagnosis not present

## 2023-11-28 DIAGNOSIS — M6281 Muscle weakness (generalized): Secondary | ICD-10-CM | POA: Diagnosis not present

## 2023-11-28 DIAGNOSIS — M5386 Other specified dorsopathies, lumbar region: Secondary | ICD-10-CM | POA: Diagnosis not present

## 2023-11-28 DIAGNOSIS — M47816 Spondylosis without myelopathy or radiculopathy, lumbar region: Secondary | ICD-10-CM | POA: Diagnosis not present

## 2023-12-02 DIAGNOSIS — M6281 Muscle weakness (generalized): Secondary | ICD-10-CM | POA: Diagnosis not present

## 2023-12-02 DIAGNOSIS — M47816 Spondylosis without myelopathy or radiculopathy, lumbar region: Secondary | ICD-10-CM | POA: Diagnosis not present

## 2023-12-02 DIAGNOSIS — M5386 Other specified dorsopathies, lumbar region: Secondary | ICD-10-CM | POA: Diagnosis not present

## 2023-12-05 DIAGNOSIS — M47816 Spondylosis without myelopathy or radiculopathy, lumbar region: Secondary | ICD-10-CM | POA: Diagnosis not present

## 2023-12-05 DIAGNOSIS — M6281 Muscle weakness (generalized): Secondary | ICD-10-CM | POA: Diagnosis not present

## 2023-12-05 DIAGNOSIS — M5386 Other specified dorsopathies, lumbar region: Secondary | ICD-10-CM | POA: Diagnosis not present

## 2023-12-06 ENCOUNTER — Ambulatory Visit

## 2023-12-07 ENCOUNTER — Ambulatory Visit (INDEPENDENT_AMBULATORY_CARE_PROVIDER_SITE_OTHER): Admitting: Urology

## 2023-12-07 VITALS — BP 144/80 | HR 55 | Wt 175.0 lb

## 2023-12-07 DIAGNOSIS — R319 Hematuria, unspecified: Secondary | ICD-10-CM | POA: Diagnosis not present

## 2023-12-07 MED ORDER — CEPHALEXIN 250 MG PO CAPS: 500.0000 mg | ORAL_CAPSULE | Freq: Once | ORAL | Status: AC

## 2023-12-07 NOTE — Progress Notes (Signed)
 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  Simultaneous HOLEP(path BPH)  Keflex  given for prophylaxis  After informed consent and discussion of the procedure and its risks, Kramer Hanrahan was positioned and prepped in the standard fashion. Cystoscopy was performed with a flexible cystoscope. The urethra, bladder neck and entire bladder was visualized in a standard fashion.  Open prostatic fossa, mild regrowth of adenoma.  The ureteral orifices were visualized in their normal location and orientation.  Moderate bladder trabeculations, no suspicious lesions, no abnormalities on retroflexion.  Findings: No evidence of recurrence  Assessment and Plan: Normal cystoscopy, no evidence of recurrence He denies any urinary complaints aside from some mild postvoid dribbling.  Continue yearly cystoscopy through at least 2028  Redell Burnet, MD 12/07/2023

## 2023-12-08 ENCOUNTER — Other Ambulatory Visit: Payer: Self-pay | Admitting: Urology

## 2023-12-13 DIAGNOSIS — M1611 Unilateral primary osteoarthritis, right hip: Secondary | ICD-10-CM | POA: Diagnosis not present

## 2023-12-13 DIAGNOSIS — M5416 Radiculopathy, lumbar region: Secondary | ICD-10-CM | POA: Diagnosis not present

## 2023-12-13 DIAGNOSIS — M47816 Spondylosis without myelopathy or radiculopathy, lumbar region: Secondary | ICD-10-CM | POA: Diagnosis not present

## 2023-12-13 DIAGNOSIS — M7918 Myalgia, other site: Secondary | ICD-10-CM | POA: Diagnosis not present

## 2023-12-16 ENCOUNTER — Other Ambulatory Visit: Payer: Self-pay | Admitting: Family Medicine

## 2023-12-16 DIAGNOSIS — M5416 Radiculopathy, lumbar region: Secondary | ICD-10-CM

## 2023-12-17 ENCOUNTER — Ambulatory Visit
Admission: RE | Admit: 2023-12-17 | Discharge: 2023-12-17 | Disposition: A | Discharge status: Home / Self Care | Source: Ambulatory Visit | Attending: Family Medicine | Admitting: Family Medicine

## 2023-12-17 DIAGNOSIS — M5116 Intervertebral disc disorders with radiculopathy, lumbar region: Secondary | ICD-10-CM | POA: Diagnosis not present

## 2023-12-17 DIAGNOSIS — M5416 Radiculopathy, lumbar region: Secondary | ICD-10-CM

## 2023-12-17 DIAGNOSIS — M48061 Spinal stenosis, lumbar region without neurogenic claudication: Secondary | ICD-10-CM | POA: Diagnosis not present

## 2023-12-17 DIAGNOSIS — M4726 Other spondylosis with radiculopathy, lumbar region: Secondary | ICD-10-CM | POA: Diagnosis not present

## 2023-12-23 DIAGNOSIS — M2550 Pain in unspecified joint: Secondary | ICD-10-CM | POA: Diagnosis not present

## 2023-12-23 NOTE — Progress Notes (Unsigned)
 Referring Physician:  Glendia Shad, MD [address] [place],  [place] [address]  Primary Physician:  Glendia Shad, MD  History of Present Illness: 12/26/23 Mr. Nathan Watts is here today with chronic back pain who comes today to discuss abnormal MRI finding.  He states that his pain increases when he goes from sitting to standing and at times radiates to his groin.  He primarily has pain however on his right buttock, right hip and lateral thigh.  He occasionally has numbness and tingling on the outside of his thigh but this is not persistent.  He has done physical therapy but is had little relief.  In addition he had a trigger point injection that did not help.  No changes to his bowel and bladder.  He denies any fevers or chills.  Duration: 5 months  Quality: sharp, stabbing Severity: 8/10  Precipitating: aggravated by after sitting when he goes to stand up Modifying factors: made better by standing, walking Weakness: none Timing: intermittent  Bowel/Bladder Dysfunction: none  Conservative measures:  Physical therapy: has participated in 6 weeks at [facility], no relief Multimodal medical therapy including regular antiinflammatories: tizanidine , tylenol  Injections:   12/13/2023: Right midlateral buttock trigger point injection (only for a couple hours)   Past Surgery: no spinal surgeries   Nathan Watts has no symptoms of cervical myelopathy.  The symptoms are causing a significant impact on the patient's life.   Review of Systems:  A 10 point review of systems is negative, except for the pertinent positives and negatives detailed in the HPI.  Past Medical History: Past Medical History:  Diagnosis Date   BPH (benign prostatic hyperplasia)    Fatty infiltration of liver    GERD (gastroesophageal reflux disease)    Hypercholesterolemia    Hyperglycemia    Hypertension    OSA on CPAP    mild obstructive sleep apnea with an AHI of 9.6  overall but moderate during REM sleep with a REM AHI of 17.5/h.  On CPAP at 8cm H2O   Thrombocytopenia (HCC)    Thyroid  nodule     Past Surgical History: Past Surgical History:  Procedure Laterality Date   ATRIAL FIBRILLATION ABLATION N/A 03/26/2022   Procedure: ATRIAL FIBRILLATION ABLATION;  Surgeon: Cindie Ole DASEN, MD;  Location: [facility];  Service: Cardiovascular;  Laterality: N/A;   ATRIAL FIBRILLATION ABLATION N/A 04/11/2023   Procedure: ATRIAL FIBRILLATION ABLATION;  Surgeon: Cindie Ole DASEN, MD;  Location: [facility];  Service: Cardiovascular;  Laterality: N/A;   CATARACT EXTRACTION W/PHACO Left 04/20/2022   Procedure: CATARACT EXTRACTION PHACO AND INTRAOCULAR LENS PLACEMENT (IOC) LEFT 13.73 01:12.4;  Surgeon: Jaye Fallow, MD;  Location: [facility];  Service: Ophthalmology;  Laterality: Left;   CATARACT EXTRACTION W/PHACO Right 05/04/2022   Procedure: CATARACT EXTRACTION PHACO AND INTRAOCULAR LENS PLACEMENT (IOC) RIGHT;  Surgeon: Jaye Fallow, MD;  Location: [facility];  Service: Ophthalmology;  Laterality: Right;  9.42 0:54.4   COLONOSCOPY WITH PROPOFOL  N/A 10/02/2014   Procedure: COLONOSCOPY WITH PROPOFOL ;  Surgeon: Lamar DASEN Holmes, MD;  Location: [facility] ENDOSCOPY;  Service: Endoscopy;  Laterality: N/A;   COLONOSCOPY WITH PROPOFOL  N/A 06/15/2021   Procedure: COLONOSCOPY WITH PROPOFOL ;  Surgeon: Maryruth Ole DASEN, MD;  Location: [facility] ENDOSCOPY;  Service: Endoscopy;  Laterality: N/A;   ESOPHAGOGASTRODUODENOSCOPY (EGD) WITH PROPOFOL  N/A 06/15/2021   Procedure: ESOPHAGOGASTRODUODENOSCOPY (EGD) WITH PROPOFOL ;  Surgeon: Maryruth Ole DASEN, MD;  Location: [facility] ENDOSCOPY;  Service: Endoscopy;  Laterality: N/A;  HOLEP-LASER ENUCLEATION OF THE PROSTATE WITH MORCELLATION N/A 04/24/2021   Procedure: HOLEP-LASER ENUCLEATION OF THE PROSTATE WITH MORCELLATION;  Surgeon: Francisca Redell BROCKS, MD;  Location: [facility] ORS;  Service: Urology;  Laterality: N/A;    INGUINAL HERNIA REPAIR     TONSILLECTOMY     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 N/A 04/24/2021   Procedure: 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   ELIQUIS  5 MG TABS tablet TAKE 1 TABLET BY MOUTH TWICE DAILY   metroNIDAZOLE (METROGEL) 0.75 % gel Apply 1 application  topically 2 (two) times daily. to face   Multiple Vitamin (MULTIVITAMIN WITH MINERALS) TABS tablet Take 1 tablet by mouth in the morning.   Omega-3 Fatty Acids (FISH OIL) 1200 MG CAPS Take 1,200 mg by mouth in the morning.   omeprazole (PRILOSEC OTC) 20 MG tablet Take 20 mg by mouth every evening.   pantoprazole  (PROTONIX ) 40 MG tablet Take 1 tablet (40 mg total) by mouth daily.   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   Probiotic Product (PROBIOTIC PO) Take 1 capsule by mouth in the morning.   Psyllium (METAMUCIL PO) Take 1 Scoop by mouth in the morning. Metamucil Fiber 1 scoop = teaspoonful   sodium chloride  (OCEAN) 0.65 % SOLN nasal spray Place 1 spray into both nostrils as needed for congestion.   tiZANidine  (ZANAFLEX ) 4 MG tablet Take 1 tablet (4 mg total) by mouth at bedtime as needed for muscle spasms.   Facility-Administered Encounter Medications as of 12/26/2023  Medication   cephALEXin  (KEFLEX ) capsule 500 mg    Social History: Social  History   Tobacco Use   Smoking status: Former    Current packs/day: 0.00    Average packs/day: 1 pack/day for 30.0 years (30.0 ttl pk-yrs)    Types: Cigarettes    Start date: 04/12/1968    Quit date: 04/12/1998    Years since quitting: 25.7    Passive exposure: Past   Smokeless tobacco: Never  Vaping Use   Vaping status: Never Used  Substance Use Topics   Alcohol use: Yes    Alcohol/week: 2.0 standard drinks of alcohol    Types: 2 Cans of beer per week    Comment: 1-2 beers 1-2 days per week   Drug use: Never    Family Medical History: Family History  Problem Relation Age of Onset   Prostate cancer Father    Heart disease Father        s/p CABG   Stroke Mother    Colon cancer Neg Hx     Physical Examination: @VITALWITHPAIN @  General: Patient is well developed, well nourished, calm, collected, and in no apparent distress. Attention to examination is appropriate.  Psychiatric: Patient is non-anxious.  Head:  Pupils equal, round, and reactive to light.  ENT:  Oral mucosa appears well hydrated.  Neck:   Supple.  Full range of motion.  Respiratory: Patient  is breathing without any difficulty.  Extremities: No edema.  Vascular: Palpable dorsal pedal pulses.  Skin:   On exposed skin, there are no abnormal skin lesions.  NEUROLOGICAL:     Awake, alert, oriented to person, place, and time.  Speech is clear and fluent. Fund of knowledge is appropriate.   Cranial Nerves: Pupils equal round and reactive to light.  Facial tone is symmetric.   ROM of spine: Patient with tenderness palpation on lateral aspect of right hip and right sided lumbar paraspinals.  Strength:  Side Iliopsoas Quads Hamstring PF DF EHL  R 5 5 5 5 5 5   L 5 5 5 5 5 5    Pain with internal and external rotation of right hip.  Reflexes are 2+ and symmetric at the patella and achilles.   Clonus is not present.  Toes are down-going.  Bilateral upper and lower extremity sensation is intact to light  touch.    Patient's gait is normal.  Medical Decision Making  Imaging:  EXAM: MRI LUMBAR SPINE WITHOUT CONTRAST   TECHNIQUE: Multiplanar, multisequence MR imaging of the lumbar spine was performed. No intravenous contrast was administered.   COMPARISON:  CT abdomen/pelvis 09/28/2023.   FINDINGS: Segmentation: 5 lumbar vertebrae. The caudal most well-formed intervertebral disc space is designated L5-S1.   Alignment: 2 mm L2-L3 grade 1 retrolisthesis. 4 mm L4-L5 grade 1 anterolisthesis.   Vertebrae: No lumbar vertebral compression fracture. Minimal degenerative endplate edema at O6-O5. Marrow edema within the posterior elements on the right at L5-S1, likely degenerative and related to facet arthropathy.   Conus medullaris and cauda equina: Conus extends to the L1-L2 level. No signal abnormality identified within the visualized distal spinal cord.   Paraspinal and other soft tissues: Visualized portions of the abdomen/retroperitoneum show no acute finding and no significant change as compared to the prior CT abdomen/pelvis 09/28/2023. No paraspinal mass or collection. Fairly prominent soft tissue edema surrounding the right L5-S1 facet joint.   Disc levels:   Multilevel disc degeneration, greatest at L3-L4 (moderate at this level).   T12-L1: Small disc bulge. No significant spinal canal or foraminal stenosis.   L1-L2: No significant disc herniation or stenosis.   L2-L3: Grade 1 retrolisthesis. Disc bulge with endplate spurring. Mild relative bilateral subarticular stenosis. Mild bilateral inferior neural foraminal narrowing.   L3-L4: Disc bulge with endplate osteophytes. Moderate facet hypertrophy with ligamentum flavum hypertrophy. Moderate central canal stenosis with left greater than right subarticular narrowing. Moderate bilateral neural foraminal narrowing (greater on the left).   L4-L5: Grade 1 anterolisthesis. Partial disc uncovering with disc bulge. Left  foraminal/extraforaminal zone posterior annular fissure. Advanced facet arthropathy with ligamentum flavum hypertrophy. Mild-to-moderate central canal stenosis with bilateral subarticular narrowing. Bilateral neural foraminal narrowing (moderate right, mild moderate left).   L5-S1: Small disc bulge. Moderate facet arthropathy on the right. Facet hypertrophy on the left. Ligamentum flavum hypertrophy (predominantly on the right). No significant spinal canal or foraminal stenosis.   Impression #1 will be called to the ordering clinician or representative by the Radiologist Assistant, and communication documented in the PACS or [employer].   IMPRESSION: 1. There is marrow edema within the posterior elements on the right at L5 and S1. Additionally, there is fairly prominent soft tissue edema surrounding the right L5-S1 facet joint. These findings could be degenerative and related to right L5-S1 facet arthropathy. However, septic facet arthritis is also a consideration. Correlate clinically and with relevant laboratory values. A short-interval follow-up MRI (with and without contrast) should  also be considered. 2. Lumbar spondylosis as outlined within the body of the report. 3. At L3-L4, there is moderate disc degeneration. Multifactorial moderate spinal canal stenosis. Moderate bilateral neural foraminal narrowing (greater on the left). 4. At L4-L5, there is multifactorial mild-to-moderate spinal canal stenosis. Bilateral neural foraminal narrowing (moderate right, mild-to-moderate left). Advanced facet arthropathy with 4 mm grade 1 anterolisthesis at this level. 5. At L2-L3, there is multifactorial mild relative bilateral subarticular and neural foraminal narrowing.     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  Regional soft tissues unremarkable.  No aggressive osseus lesion.  Mild to moderate right hip joint space loss and osteophyte formation.   I have personally reviewed the images and agree with the above interpretation.  Assessment and Plan: Nathan Watts is a pleasant 78 y.o. male who comes in today for concerning MRI showing marrow edema at L5 and S1 with possible concern of septic facet arthritis.  Patient is not having any systemic symptoms including fever and chills.  The majority of his pain is on the outside of his right hip and then his right buttock area.  He denies any pain that goes down the back of his leg.  He does have some pain that goes to the outside of his thigh.  -Plan for ESR and CRP.   -Would like flexion-extension x-rays of lumbar spine to evaluate for listhesis. -As recommended by radiology, MRI with and without contrast of the lumbar spine to further evaluate possible septic facet arthritis - Follow-up with orthopedics for possible hip injection and hip evaluation as moderate osteoarthritis of his right hip was previously seen on x-rays. - Plan for follow-up as imaging comes in.  Red flag symptoms reviewed with the patient.  He was encouraged to reach out to me for questions and concerns.   Thank you for involving me in the care of this patient.    Lyle Decamp, PA-C Dept. of Neurosurgery

## 2023-12-26 ENCOUNTER — Encounter: Payer: Self-pay | Admitting: Physician Assistant

## 2023-12-26 ENCOUNTER — Other Ambulatory Visit
Admission: RE | Admit: 2023-12-26 | Discharge: 2023-12-26 | Disposition: A | Discharge status: Home / Self Care | Source: Ambulatory Visit | Attending: Physician Assistant | Admitting: Physician Assistant

## 2023-12-26 ENCOUNTER — Ambulatory Visit (INDEPENDENT_AMBULATORY_CARE_PROVIDER_SITE_OTHER): Admitting: Physician Assistant

## 2023-12-26 ENCOUNTER — Ambulatory Visit: Payer: Self-pay

## 2023-12-26 VITALS — BP 136/76 | Ht 71.0 in | Wt 184.0 lb

## 2023-12-26 DIAGNOSIS — R937 Abnormal findings on diagnostic imaging of other parts of musculoskeletal system: Secondary | ICD-10-CM

## 2023-12-26 DIAGNOSIS — M25551 Pain in right hip: Secondary | ICD-10-CM

## 2023-12-26 LAB — SEDIMENTATION RATE: Sed Rate: 47 mm/h — ABNORMAL HIGH (ref 0–20)

## 2023-12-26 LAB — C-REACTIVE PROTEIN: CRP: 1 mg/dL — ABNORMAL HIGH (ref <1.0)

## 2023-12-28 DIAGNOSIS — K080 Exfoliation of teeth due to systemic causes: Secondary | ICD-10-CM | POA: Diagnosis not present

## 2024-01-02 ENCOUNTER — Encounter: Payer: Self-pay | Admitting: Cardiology

## 2024-01-02 ENCOUNTER — Ambulatory Visit
Admission: RE | Admit: 2024-01-02 | Discharge: 2024-01-02 | Disposition: A | Discharge status: Home / Self Care | Source: Ambulatory Visit | Attending: Physician Assistant | Admitting: Physician Assistant

## 2024-01-02 DIAGNOSIS — R937 Abnormal findings on diagnostic imaging of other parts of musculoskeletal system: Secondary | ICD-10-CM

## 2024-01-02 MED ORDER — GADOPICLENOL 0.5 MMOL/ML IV SOLN
10.0000 mL | Freq: Once | INTRAVENOUS | Status: AC | PRN
Start: 2024-01-02 — End: 2024-01-02
  Administered 2024-01-02: 9 mL via INTRAVENOUS

## 2024-01-10 NOTE — Telephone Encounter (Signed)
Patients voice mail full.

## 2024-01-11 ENCOUNTER — Other Ambulatory Visit: Payer: Self-pay | Admitting: Physician Assistant

## 2024-01-11 ENCOUNTER — Encounter: Payer: Self-pay | Admitting: Internal Medicine

## 2024-01-11 DIAGNOSIS — R937 Abnormal findings on diagnostic imaging of other parts of musculoskeletal system: Secondary | ICD-10-CM

## 2024-01-11 NOTE — Telephone Encounter (Signed)
Scheduled for 10/08

## 2024-01-17 ENCOUNTER — Encounter: Payer: Self-pay | Admitting: Internal Medicine

## 2024-01-18 ENCOUNTER — Ambulatory Visit: Admitting: Neurosurgery

## 2024-01-18 DIAGNOSIS — R937 Abnormal findings on diagnostic imaging of other parts of musculoskeletal system: Secondary | ICD-10-CM | POA: Diagnosis not present

## 2024-01-18 NOTE — Progress Notes (Signed)
 I had a follow-up phone visit with Nathan Watts today.  He was at home and I was in the office.  He gave consent to go forward with a phone visit.  We discussed his MRI.  Showed a reactive arthritis in his facet joint.  There was some question about whether or not this could possibly be infectious.  He is not having any fevers chills or any other constitutional symptoms.  He states that his back pain is not being progressively worse.  He is not having any new progressive lower extremity issues.  We discussed options including biopsy/culture versus watchful waiting.  We decided to go forward with a observational status, we will plan on making a repeat phone call with him in 6 weeks to discuss whether or not he is having any worsening symptoms.  Should he have any worsening symptoms we could move forward with the biopsy, however without the presence of any other infectious symptoms it would be reasonable to watch him conservatively.  Will plan on following up in 6 weeks with a phone call.  Red flags were discussed.  Spent a total of 10 minutes on the phone call today.

## 2024-02-17 ENCOUNTER — Other Ambulatory Visit: Payer: Self-pay | Admitting: Cardiology

## 2024-02-17 DIAGNOSIS — I4892 Unspecified atrial flutter: Secondary | ICD-10-CM

## 2024-02-17 NOTE — Telephone Encounter (Signed)
 Prescription refill request for Eliquis  received. Indication: A. FIB Last office visit: 3/38/25 Scr: 1.10 (EPIC 09/12/23) Age:  78 Weight: 83.5 kg   Per Protocol okay to refill at current dose.

## 2024-03-15 ENCOUNTER — Telehealth: Payer: Self-pay

## 2024-03-15 NOTE — Telephone Encounter (Signed)
 Received a call from [place] at [facility] physiatry to follow up on the patient. He was orginally referred to our office for abnormal MRI in September, their office at that time advised patient they would follow up with another MRI in 3 months-which is about now. He had a phone call visit with Dr Claudene on 01/18/2024 and per that note were going to observe this for now and set up another phone call in 6 weeks from that day. This has not been done.  Dr Claudene, ok to schedule patient for a phone call with you? Any particular day?  Also, Psychiatry office wanted to know if patient does need a repeat MRI scan are we going to order that or their office needs to take care of it still? (Need to call their office with the answer at [phone] and speak to anyone that answers the call.

## 2024-03-15 NOTE — Telephone Encounter (Signed)
 Spoke with patient, he confirmed wanting to just hold off on any additional imaging and just observe. He is not having any concerns or worsening symptoms. Wanted to make sure Dr Claudene agreed not to repeat any imaging at this time? Also, patient wanted to know what time frame he should follow up routinely-he said maybe a year?

## 2024-03-19 NOTE — Telephone Encounter (Signed)
 [facility] is calling for an update, adv that the message was just rerouted an hour ago & still awaiting a response.

## 2024-03-20 NOTE — Telephone Encounter (Signed)
 Patient advised. Appointment for a year out made and I called [facility] physiatry and left a detailed message with this information

## 2024-04-09 ENCOUNTER — Encounter: Payer: Self-pay | Admitting: Internal Medicine

## 2024-04-09 ENCOUNTER — Ambulatory Visit (INDEPENDENT_AMBULATORY_CARE_PROVIDER_SITE_OTHER)

## 2024-04-09 ENCOUNTER — Ambulatory Visit: Payer: Self-pay

## 2024-04-09 VITALS — BP 136/70 | HR 61 | Temp 98.2°F | Wt 188.4 lb

## 2024-04-09 DIAGNOSIS — L03019 Cellulitis of unspecified finger: Secondary | ICD-10-CM | POA: Diagnosis not present

## 2024-04-09 MED ORDER — DOXYCYCLINE HYCLATE 100 MG PO TABS: 100.0000 mg | ORAL_TABLET | Freq: Two times a day (BID) | ORAL | 0 refills | Status: AC

## 2024-04-09 NOTE — Progress Notes (Signed)
 "     Acute visit   Patient: Nathan Watts   DOB: 1945/06/06   78 y.o. Male  MRN: [id number] PCP: Glendia Shad, MD   Chief Complaint  Patient presents with   Laceration    On his right ring finger Some reddness and blistering  Sore to touch Some swelling  Hard to bend   Subjective    Discussed the use of AI scribe software for clinical note transcription with the patient, who gave verbal consent to proceed.  History of Present Illness Nathan Watts is a 78 year old male who presents with pain and erythema on his finger following a cut.  He cut his finger the day before Christmas while slicing vegetables. Initially, he wrapped the cut, believing it would heal without issue. By Saturday, blisters and redness developed on the back of his finger.  He has been cleaning the wound with hydrogen peroxide and applying triple antibiotic ointment and Vaseline. Initially, he kept the wound covered with a Band-Aid, but after the blisters appeared, he stopped covering it, suspecting a reaction to the Band-Aid. He also used white tape to secure the Band-Aid.  No pus or discharge from the wound, although clear liquid is noted from the blisters when pressed. The area is sore when pressed but not painful otherwise. He has not experienced similar reactions with previous cuts.  He can bend his finger, although it feels tight, and denies any locking of the affected finger.   He has no known allergies to antibiotics.  No pus or discharge from the wound. He has not been around anyone who has been sick recently. No previous similar reactions to cuts.  Review of systems as noted in HPI.   Objective    BP 136/70   Pulse 61   Temp 98.2 F (36.8 C) (Oral)   Wt 188 lb 6.4 oz (85.5 kg)   SpO2 99%   BMI 26.28 kg/m  Physical Exam Constitutional:      Appearance: Normal appearance.  HENT:     Head: Normocephalic and atraumatic.     Mouth/Throat:     Mouth: Mucous membranes  are moist.  Eyes:     Pupils: Pupils are equal, round, and reactive to light.  Pulmonary:     Effort: Pulmonary effort is normal.  Skin:    General: Skin is warm.     Comments: Right ring finger: Laceration healing well with surrounding erythema, mild tenderness, and multiple vesicles. No abscess or purulence present.  Neurological:     General: No focal deficit present.     Mental Status: He is alert.       No results found for any visits on 04/09/24.  Assessment & Plan     Problem List Items Addressed This Visit   None Visit Diagnoses       Cellulitis of finger, unspecified laterality    -  Primary   Relevant Medications   doxycycline  (VIBRA -TABS) 100 MG tablet      Assessment & Plan Cellulitis of finger Patient with erythema, mild tenderness, and vesicles surrounding right ring finger after recent cut. Laceration is healing well. Ddx includes cellulitis, irritant dermatitis (from hydrogen peroxide,adhesives, ointments), herpetic whitlow. Low concern for abscess or deeper hand infection. - Prescribed doxycycline  100 mg orally twice daily for 7 days. - Advised avoiding hydrogen peroxide and adhesives. - Advised return if symptoms worsen    Meds ordered this encounter  Medications   doxycycline  (VIBRA -TABS) 100 MG tablet  Sig: Take 1 tablet (100 mg total) by mouth 2 (two) times daily for 7 days.    Dispense:  14 tablet    Refill:  0     No follow-ups on file.      Isaiah DELENA Pepper, MD  [facility] [phone] (phone) [phone] (fax) "

## 2024-04-09 NOTE — Telephone Encounter (Signed)
 FYI Only or Action Required?: FYI only for provider: appointment scheduled on 04/09/24 at alternate office.  Patient was last seen in primary care on 09/15/2023 by Glendia Shad, MD.  Called Nurse Triage reporting Laceration.  Symptoms began several days ago.  Interventions attempted: Other: cleaning with hydrogen peroxide and covering with bandage.  Symptoms are: gradually worsening.  Triage Disposition: See Physician Within 24 Hours  Patient/caregiver understands and will follow disposition?: Yes                                  1. APPEARANCE of INJURY: What does the injury look like?      Small cut that has healed, but redness and blistering on backside of finger 2. ONSET: How long ago did the injury occur?      Cut finger several days ago, redness and blistering began on Saturday  3. LOCATION: Where is the injury located?      Right hand, finger next to pinky 4. SIZE: How large is the cut?      Cut is 1/2 inch long, reports cut itself is healed Redness is 1/2 x 1/2 inch on backside of finger 5. BLEEDING: Is it bleeding now? If Yes, ask: Is it difficult to stop?      Denies 6. PAIN: Is there any pain? If Yes, ask: How bad is the pain? (Scale 0-10; or none, mild, moderate, severe)     Describes mild pain as tenderness, rates pain a 2, if any 8. TETANUS: When was your last tetanus booster?     2018    This RN advised in-person evaluation within 24 hours. No availability with PCP office. This RN scheduled same day appointment at alternate office within region.   Copied from CRM #[id number]. Topic: Clinical - Red Word Triage >> Apr 09, 2024 12:41 PM Harlene ORN wrote: Red Word that prompted transfer to Nurse Triage: cut his finger on his right a few days ago and looks like it got infected. Swelling and redness, blistering  Reason for Disposition  [1] Looks infected (e.g., spreading redness, pus) AND [2] no fever  Protocols used:  Cuts and Lacerations-A-AH

## 2024-04-20 ENCOUNTER — Ambulatory Visit (INDEPENDENT_AMBULATORY_CARE_PROVIDER_SITE_OTHER): Admitting: Vascular Surgery

## 2024-04-20 ENCOUNTER — Ambulatory Visit (INDEPENDENT_AMBULATORY_CARE_PROVIDER_SITE_OTHER)

## 2024-04-20 ENCOUNTER — Encounter (INDEPENDENT_AMBULATORY_CARE_PROVIDER_SITE_OTHER): Payer: Self-pay | Admitting: Vascular Surgery

## 2024-04-20 VITALS — BP 146/87 | HR 50 | Resp 18 | Wt 183.0 lb

## 2024-04-20 DIAGNOSIS — I10 Essential (primary) hypertension: Secondary | ICD-10-CM | POA: Diagnosis not present

## 2024-04-20 DIAGNOSIS — I723 Aneurysm of iliac artery: Secondary | ICD-10-CM

## 2024-04-20 DIAGNOSIS — I77819 Aortic ectasia, unspecified site: Secondary | ICD-10-CM

## 2024-04-20 DIAGNOSIS — E7800 Pure hypercholesterolemia, unspecified: Secondary | ICD-10-CM | POA: Diagnosis not present

## 2024-04-20 NOTE — Progress Notes (Signed)
 "   MRN : [id number]  Nathan Watts is a 79 y.o. (09/27/1945) male who presents with chief complaint of  Chief Complaint  Patient presents with   Follow-up    6 month iliac u/s follow up  .  History of Present Illness:   Discussed the use of AI scribe software for clinical note transcription with the patient, who gave verbal consent to proceed.  History of Present Illness Nathan Watts is a 79 year old male with right iliac artery aneurysm who presents for routine vascular surgery follow-up.  Recent imaging shows a right iliac artery aneurysm measuring 1.7 cm and an aortic diameter of 2.9 cm, unchanged from prior studies. He reports no new symptoms since his last visit.  He has no pain, swelling, or other vascular complaints and performs usual activities without limitation.     Results Radiology Aortic duplex shows the aorta maximal diameter of 2.9 cm in the right common iliac artery maximal diameter of 1.7 cm.  Left common iliac artery is about 1.4 to 1.5 cm.  Current Outpatient Medications  Medication Sig Dispense Refill   apixaban  (ELIQUIS ) 5 MG TABS tablet TAKE 1 TABLET BY MOUTH TWICE DAILY 180 tablet 1   atorvastatin  (LIPITOR) 20 MG tablet TAKE ONE TABLET BY MOUTH DAILY 90 tablet 3   dextromethorphan-guaiFENesin (MUCINEX DM) 30-600 MG 12hr tablet Take 1 tablet by mouth 2 (two) times daily as needed (congestion).     metroNIDAZOLE (METROGEL) 0.75 % gel Apply 1 application  topically 2 (two) times daily. to face     Multiple Vitamin (MULTIVITAMIN WITH MINERALS) TABS tablet Take 1 tablet by mouth in the morning.     Omega-3 Fatty Acids (FISH OIL) 1200 MG CAPS Take 1,200 mg by mouth in the morning.     omeprazole (PRILOSEC OTC) 20 MG tablet Take 20 mg by mouth every evening.     pantoprazole  (PROTONIX ) 40 MG tablet Take 1 tablet (40 mg total) by mouth daily. 45 tablet 0   pimecrolimus (ELIDEL) 1 % cream Apply 1 application  topically daily as needed (rosacea).      Probiotic Product (PROBIOTIC PO) Take 1 capsule by mouth in the morning.     Psyllium (METAMUCIL PO) Take 1 Scoop by mouth in the morning. Metamucil Fiber 1 scoop = teaspoonful     Current Facility-Administered Medications  Medication Dose Route Frequency Provider Last Rate Last Admin   cephALEXin  (KEFLEX ) capsule 500 mg  500 mg Oral Once Francisca Redell BROCKS, MD        Past Medical History:  Diagnosis Date   BPH (benign prostatic hyperplasia)    Fatty infiltration of liver    GERD (gastroesophageal reflux disease)    Hypercholesterolemia    Hyperglycemia    Hypertension    OSA on CPAP    mild obstructive sleep apnea with an AHI of 9.6 overall but moderate during REM sleep with a REM AHI of 17.5/h.  On CPAP at 8cm H2O   Thrombocytopenia    Thyroid  nodule     Past Surgical History:  Procedure Laterality Date   ATRIAL FIBRILLATION ABLATION N/A 03/26/2022   Procedure: ATRIAL FIBRILLATION ABLATION;  Surgeon: Cindie Ole DASEN, MD;  Location: [facility];  Service: Cardiovascular;  Laterality: N/A;   ATRIAL FIBRILLATION ABLATION N/A 04/11/2023   Procedure: ATRIAL FIBRILLATION ABLATION;  Surgeon: Cindie Ole DASEN, MD;  Location: [facility];  Service: Cardiovascular;  Laterality: N/A;   CATARACT EXTRACTION W/PHACO Left 04/20/2022  Procedure: CATARACT EXTRACTION PHACO AND INTRAOCULAR LENS PLACEMENT (IOC) LEFT 13.73 01:12.4;  Surgeon: Jaye Fallow, MD;  Location: [facility];  Service: Ophthalmology;  Laterality: Left;   CATARACT EXTRACTION W/PHACO Right 05/04/2022   Procedure: CATARACT EXTRACTION PHACO AND INTRAOCULAR LENS PLACEMENT (IOC) RIGHT;  Surgeon: Jaye Fallow, MD;  Location: [facility];  Service: Ophthalmology;  Laterality: Right;  9.42 0:54.4   COLONOSCOPY WITH PROPOFOL  N/A 10/02/2014   Procedure: COLONOSCOPY WITH PROPOFOL ;  Surgeon: Lamar ONEIDA Holmes, MD;  Location: [facility];  Service: Endoscopy;  Laterality: N/A;   COLONOSCOPY WITH  PROPOFOL  N/A 06/15/2021   Procedure: COLONOSCOPY WITH PROPOFOL ;  Surgeon: Maryruth Ole ONEIDA, MD;  Location: [facility];  Service: Endoscopy;  Laterality: N/A;   ESOPHAGOGASTRODUODENOSCOPY (EGD) WITH PROPOFOL  N/A 06/15/2021   Procedure: ESOPHAGOGASTRODUODENOSCOPY (EGD) WITH PROPOFOL ;  Surgeon: Maryruth Ole ONEIDA, MD;  Location: [facility];  Service: Endoscopy;  Laterality: N/A;   HOLEP-LASER ENUCLEATION OF THE PROSTATE WITH MORCELLATION N/A 04/24/2021   Procedure: HOLEP-LASER ENUCLEATION OF THE PROSTATE WITH MORCELLATION;  Surgeon: Francisca Redell BROCKS, MD;  Location: [facility];  Service: Urology;  Laterality: N/A;   INGUINAL HERNIA REPAIR     TONSILLECTOMY     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 N/A 04/24/2021   Procedure: 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 Vascular:  [] Pain in legs with walking   [] Pain in legs at rest   [] Pain in legs when laying flat   [] Claudication   [] Pain in feet when walking  [] Pain in feet at rest  [] Pain in feet when laying flat   [] History of DVT   [] Phlebitis   [] Swelling in legs   [] Varicose veins   [] Non-healing ulcers Pulmonary:   [] Uses home oxygen   [] Productive cough   [] Hemoptysis   [] Wheeze  [] COPD   [] Asthma Neurologic:  [] Dizziness  [] Blackouts   [] Seizures   [] History of stroke   [] History of TIA  [] Aphasia   [] Temporary blindness   [] Dysphagia   [] Weakness  or numbness in arms   [] Weakness or numbness in legs Musculoskeletal:  [] Arthritis   [] Joint swelling   [] Joint pain   [] Low back pain Hematologic:  [] Easy bruising  [] Easy bleeding   [] Hypercoagulable state   [x] Anemic   Gastrointestinal:  [] Blood in stool   [] Vomiting blood  [x] Gastroesophageal reflux/heartburn   [] Abdominal pain Genitourinary:  [] Chronic kidney disease   [] Difficult urination  [] Frequent urination  [] Burning with urination   [] Hematuria Skin:  [] Rashes   [] Ulcers   [] Wounds Psychological:  [] History of anxiety   []  History of major depression.  Physical Examination  BP (!) 146/87 (BP Location: Left Arm)   Pulse (!) 50   Resp 18   Wt 183 lb (83 kg)   BMI 25.52 kg/m  Gen:  WD/WN, NAD.  Appears  younger than stated age Head: [place]/AT, No temporalis wasting. Ear/Nose/Throat: Hearing grossly intact, nares w/o erythema or drainage Eyes: Conjunctiva clear. Sclera non-icteric Neck: Supple.  Trachea midline Pulmonary:  Good air movement, no use of accessory muscles.  Cardiac: RRR, no JVD Vascular:  Vessel Right Left  Radial Palpable Palpable                          PT Palpable Palpable  DP Palpable Palpable   Gastrointestinal: soft, non-tender/non-distended. No guarding/reflex.  Musculoskeletal: M/S 5/5 throughout.  No deformity or atrophy.  No edema. Neurologic: Sensation grossly intact in extremities.  Symmetrical.  Speech is fluent.  Psychiatric: Judgment intact, Mood & affect appropriate for pt's clinical situation. Dermatologic: No rashes or ulcers noted.  No cellulitis or open wounds.  Physical Exam     Labs No results found for this or any previous visit (from the past 2160 hours).  Radiology No results found.  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 - Follow-up in two years; if significant size increase, shorten  interval to one year.  Hypertension, essential blood pressure control important in reducing the progression of atherosclerotic disease and aneurysmal disease     Hypercholesterolemia lipid control important in reducing the progression of atherosclerotic disease. Continue statin therapy   Selinda Gu, MD  04/20/2024 3:37 PM    This note was created with Dragon medical transcription system.  Any errors from dictation are purely unintentional     [1]  Social History Tobacco Use   Smoking status: Former    Current packs/day: 0.00    Average packs/day: 1 pack/day for 30.0 years (30.0 ttl pk-yrs)    Types: Cigarettes    Start date: 04/12/1968    Quit date: 04/12/1998    Years since quitting: 26.0    Passive exposure: Past   Smokeless tobacco: Never  Vaping Use   Vaping status: Never Used  Substance Use Topics   Alcohol use: Yes    Alcohol/week: 2.0 standard drinks of alcohol    Types: 2 Cans of beer per week    Comment: 1-2 beers 1-2 days per week   Drug use: Never  [2]  Allergies Allergen Reactions   Crestor [Rosuvastatin] Other (See Comments)    Causes GI upset and anxiety   Hydrocodone -Chlorpheniramine Other (See Comments)    possible causes anxiety Did ok with low dose   "

## 2024-07-10 ENCOUNTER — Ambulatory Visit: Admitting: Cardiology

## 2024-07-26 ENCOUNTER — Ambulatory Visit: Admitting: Cardiology

## 2024-12-05 ENCOUNTER — Other Ambulatory Visit: Admitting: Urology

## 2025-01-14 ENCOUNTER — Ambulatory Visit
